# Patient Record
Sex: Female | Born: 1955 | Race: Black or African American | Hispanic: No | Marital: Married | Smoking: Former smoker
Health system: Southern US, Community
[De-identification: ages and names within clinical notes are randomized; demographics above are authoritative.]

## PROBLEM LIST (undated history)

## (undated) DIAGNOSIS — I10 Essential (primary) hypertension: Secondary | ICD-10-CM

## (undated) DIAGNOSIS — F329 Major depressive disorder, single episode, unspecified: Secondary | ICD-10-CM

## (undated) DIAGNOSIS — G4700 Insomnia, unspecified: Secondary | ICD-10-CM

## (undated) DIAGNOSIS — IMO0001 Reserved for inherently not codable concepts without codable children: Secondary | ICD-10-CM

## (undated) DIAGNOSIS — M1990 Unspecified osteoarthritis, unspecified site: Secondary | ICD-10-CM

## (undated) DIAGNOSIS — E139 Other specified diabetes mellitus without complications: Secondary | ICD-10-CM

## (undated) DIAGNOSIS — K219 Gastro-esophageal reflux disease without esophagitis: Secondary | ICD-10-CM

## (undated) DIAGNOSIS — F419 Anxiety disorder, unspecified: Secondary | ICD-10-CM

## (undated) DIAGNOSIS — G43909 Migraine, unspecified, not intractable, without status migrainosus: Secondary | ICD-10-CM

## (undated) DIAGNOSIS — J329 Chronic sinusitis, unspecified: Secondary | ICD-10-CM

## (undated) DIAGNOSIS — F32A Depression, unspecified: Secondary | ICD-10-CM

## (undated) HISTORY — DX: Unspecified osteoarthritis, unspecified site: M19.90

## (undated) HISTORY — DX: Essential (primary) hypertension: I10

## (undated) HISTORY — DX: Gastro-esophageal reflux disease without esophagitis: K21.9

## (undated) HISTORY — DX: Depression, unspecified: F32.A

## (undated) HISTORY — DX: Migraine, unspecified, not intractable, without status migrainosus: G43.909

## (undated) HISTORY — PX: ESOPHAGOGASTRODUODENOSCOPY: SHX1529

## (undated) HISTORY — DX: Insomnia, unspecified: G47.00

## (undated) HISTORY — DX: Reserved for inherently not codable concepts without codable children: IMO0001

## (undated) HISTORY — DX: Major depressive disorder, single episode, unspecified: F32.9

## (undated) HISTORY — DX: Anxiety disorder, unspecified: F41.9

## (undated) HISTORY — DX: Chronic sinusitis, unspecified: J32.9

## (undated) HISTORY — PX: COLONOSCOPY: SHX5424

## (undated) HISTORY — DX: Other specified diabetes mellitus without complications: E13.9

## (undated) HISTORY — PX: ABDOMINAL HYSTERECTOMY: SHX81

## (undated) HISTORY — PX: OOPHORECTOMY: SHX86

---

## 1978-09-29 HISTORY — PX: RECTOVAGINAL FISTULA CLOSURE: SUR265

## 1998-09-29 HISTORY — PX: CYSTECTOMY: SUR359

## 2004-09-29 HISTORY — PX: OVARIAN CYST SURGERY: SHX726

## 2004-10-18 ENCOUNTER — Emergency Department: Payer: Self-pay | Admitting: Unknown Physician Specialty

## 2005-03-03 ENCOUNTER — Inpatient Hospital Stay (HOSPITAL_COMMUNITY): Admission: AD | Admit: 2005-03-03 | Discharge: 2005-03-03 | Payer: Self-pay | Admitting: Obstetrics & Gynecology

## 2005-03-27 ENCOUNTER — Ambulatory Visit: Payer: Self-pay | Admitting: Obstetrics & Gynecology

## 2006-03-17 ENCOUNTER — Ambulatory Visit: Payer: Self-pay | Admitting: Gynecologic Oncology

## 2006-06-18 ENCOUNTER — Ambulatory Visit: Payer: Self-pay

## 2007-06-22 ENCOUNTER — Ambulatory Visit: Payer: Self-pay

## 2007-10-08 ENCOUNTER — Other Ambulatory Visit: Payer: Self-pay

## 2007-10-08 ENCOUNTER — Inpatient Hospital Stay: Payer: Self-pay | Admitting: Internal Medicine

## 2007-11-01 ENCOUNTER — Ambulatory Visit: Payer: Self-pay | Admitting: Internal Medicine

## 2007-11-08 ENCOUNTER — Ambulatory Visit: Payer: Self-pay | Admitting: Internal Medicine

## 2008-06-23 ENCOUNTER — Ambulatory Visit: Payer: Self-pay | Admitting: Internal Medicine

## 2008-12-18 ENCOUNTER — Ambulatory Visit: Payer: Self-pay | Admitting: Orthopedic Surgery

## 2009-07-19 ENCOUNTER — Ambulatory Visit: Payer: Self-pay | Admitting: Nephrology

## 2009-09-05 ENCOUNTER — Ambulatory Visit: Payer: Self-pay | Admitting: Internal Medicine

## 2010-03-14 ENCOUNTER — Ambulatory Visit: Payer: Self-pay | Admitting: Internal Medicine

## 2010-04-07 ENCOUNTER — Ambulatory Visit: Payer: Self-pay | Admitting: Sports Medicine

## 2010-04-26 ENCOUNTER — Ambulatory Visit: Payer: Self-pay | Admitting: Orthopedic Surgery

## 2010-05-02 ENCOUNTER — Ambulatory Visit: Payer: Self-pay | Admitting: Orthopedic Surgery

## 2010-10-02 ENCOUNTER — Ambulatory Visit: Payer: Self-pay

## 2010-11-19 ENCOUNTER — Ambulatory Visit: Payer: Self-pay | Admitting: Internal Medicine

## 2010-12-20 ENCOUNTER — Ambulatory Visit: Payer: Self-pay | Admitting: Emergency Medicine

## 2010-12-24 ENCOUNTER — Ambulatory Visit: Payer: Self-pay | Admitting: Emergency Medicine

## 2011-01-31 ENCOUNTER — Ambulatory Visit: Payer: Self-pay

## 2011-04-26 ENCOUNTER — Emergency Department: Payer: Self-pay | Admitting: *Deleted

## 2011-05-19 ENCOUNTER — Ambulatory Visit: Payer: Self-pay | Admitting: Unknown Physician Specialty

## 2011-05-28 ENCOUNTER — Ambulatory Visit: Payer: Self-pay | Admitting: Internal Medicine

## 2011-09-02 ENCOUNTER — Ambulatory Visit: Payer: Self-pay

## 2011-10-22 ENCOUNTER — Ambulatory Visit: Payer: Self-pay

## 2012-06-04 ENCOUNTER — Ambulatory Visit: Payer: Self-pay

## 2012-06-08 ENCOUNTER — Ambulatory Visit: Payer: Self-pay | Admitting: Internal Medicine

## 2012-11-22 ENCOUNTER — Ambulatory Visit: Payer: Self-pay

## 2013-11-05 IMAGING — CR DG HIP COMPLETE 2+V*L*
1 series · 2 of 2 positions shown · non-contrast
Comparison: none

REASON FOR EXAM: left hip pain
COMMENTS:

PROCEDURE:     DXR - DXR HIP LEFT COMPLETE  - June 04, 2012  [DATE]
RESULT:     AP and frog-leg views the left hip demonstrate no definite
fracture, dislocation or radiopaque foreign body.

[Series 1: ap · 0.17mm/px · 2 of 2 slices shown]
[im 1/2]
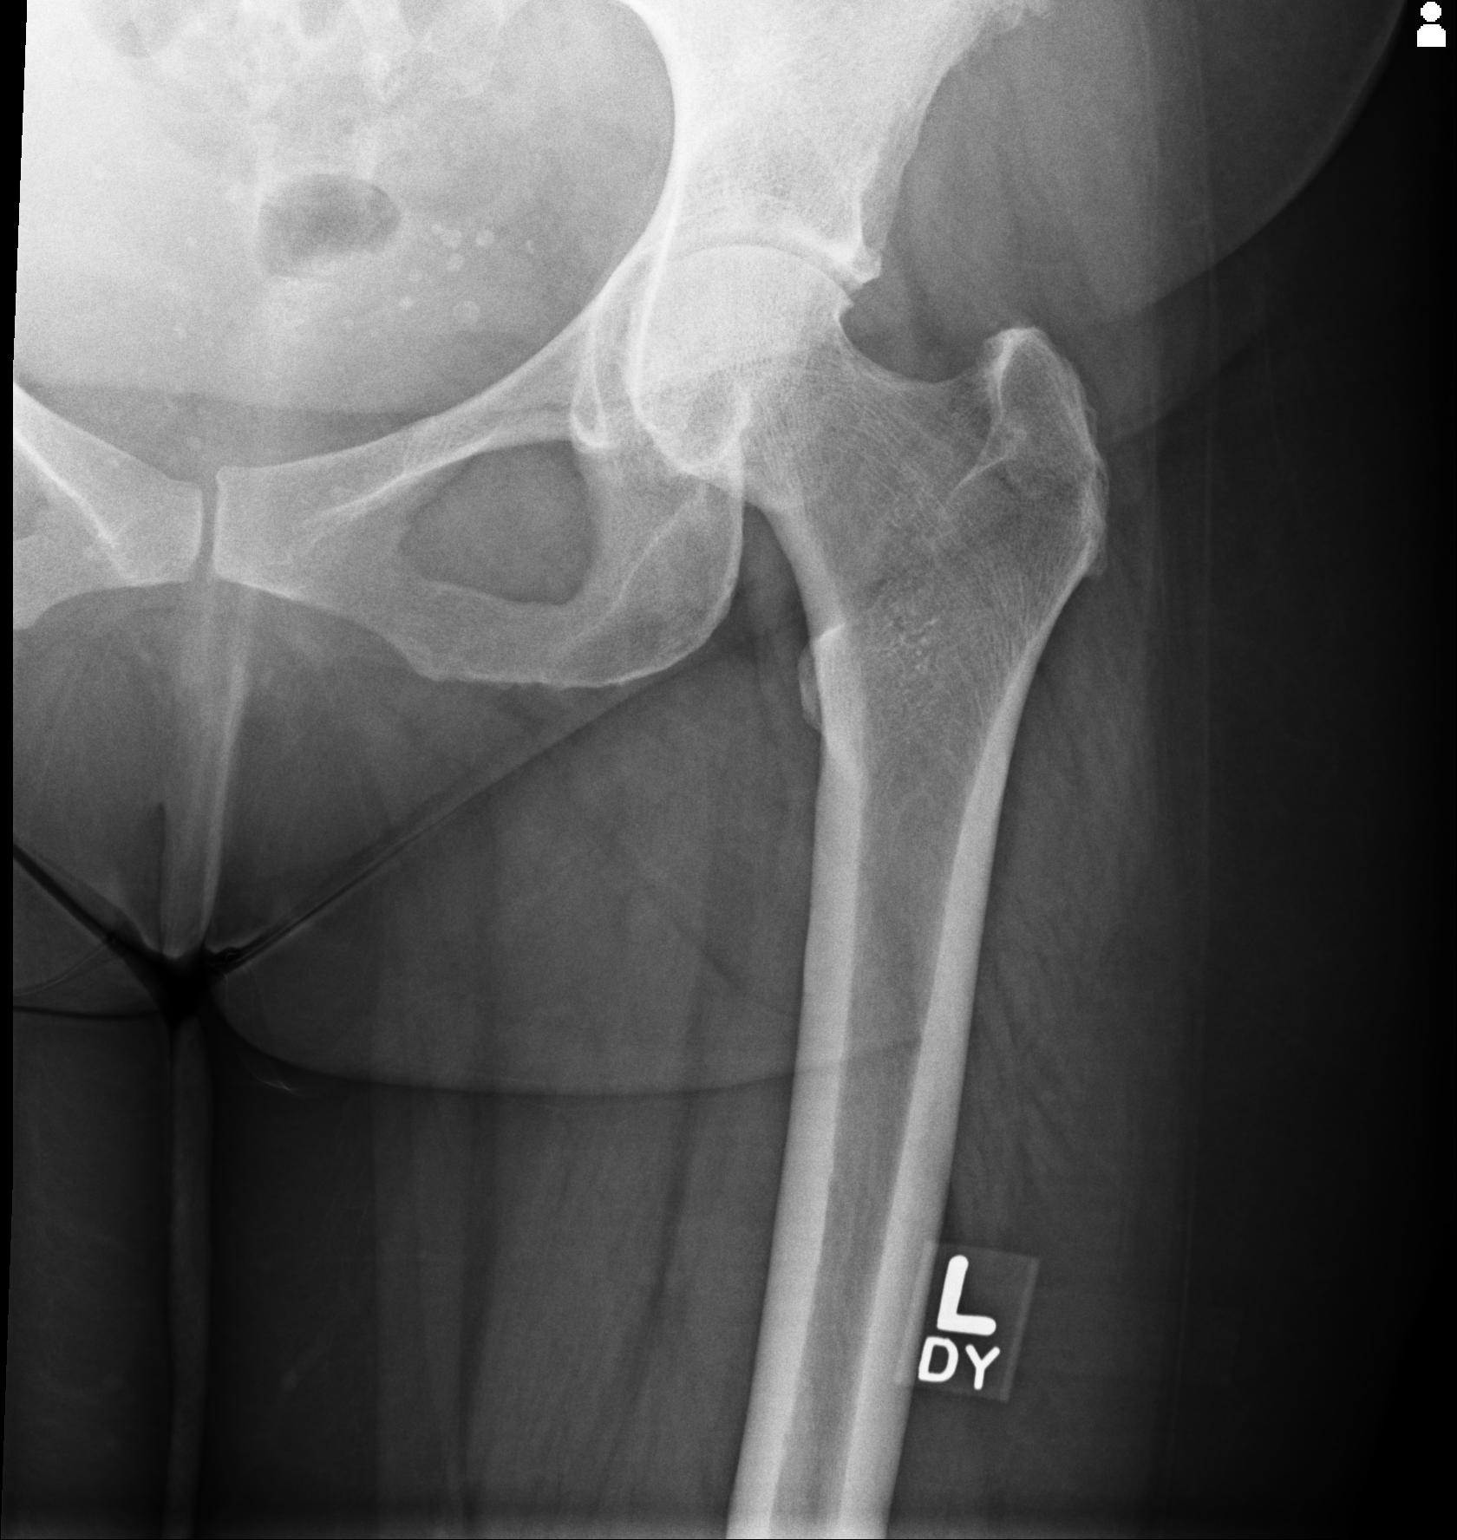
[im 2/2]
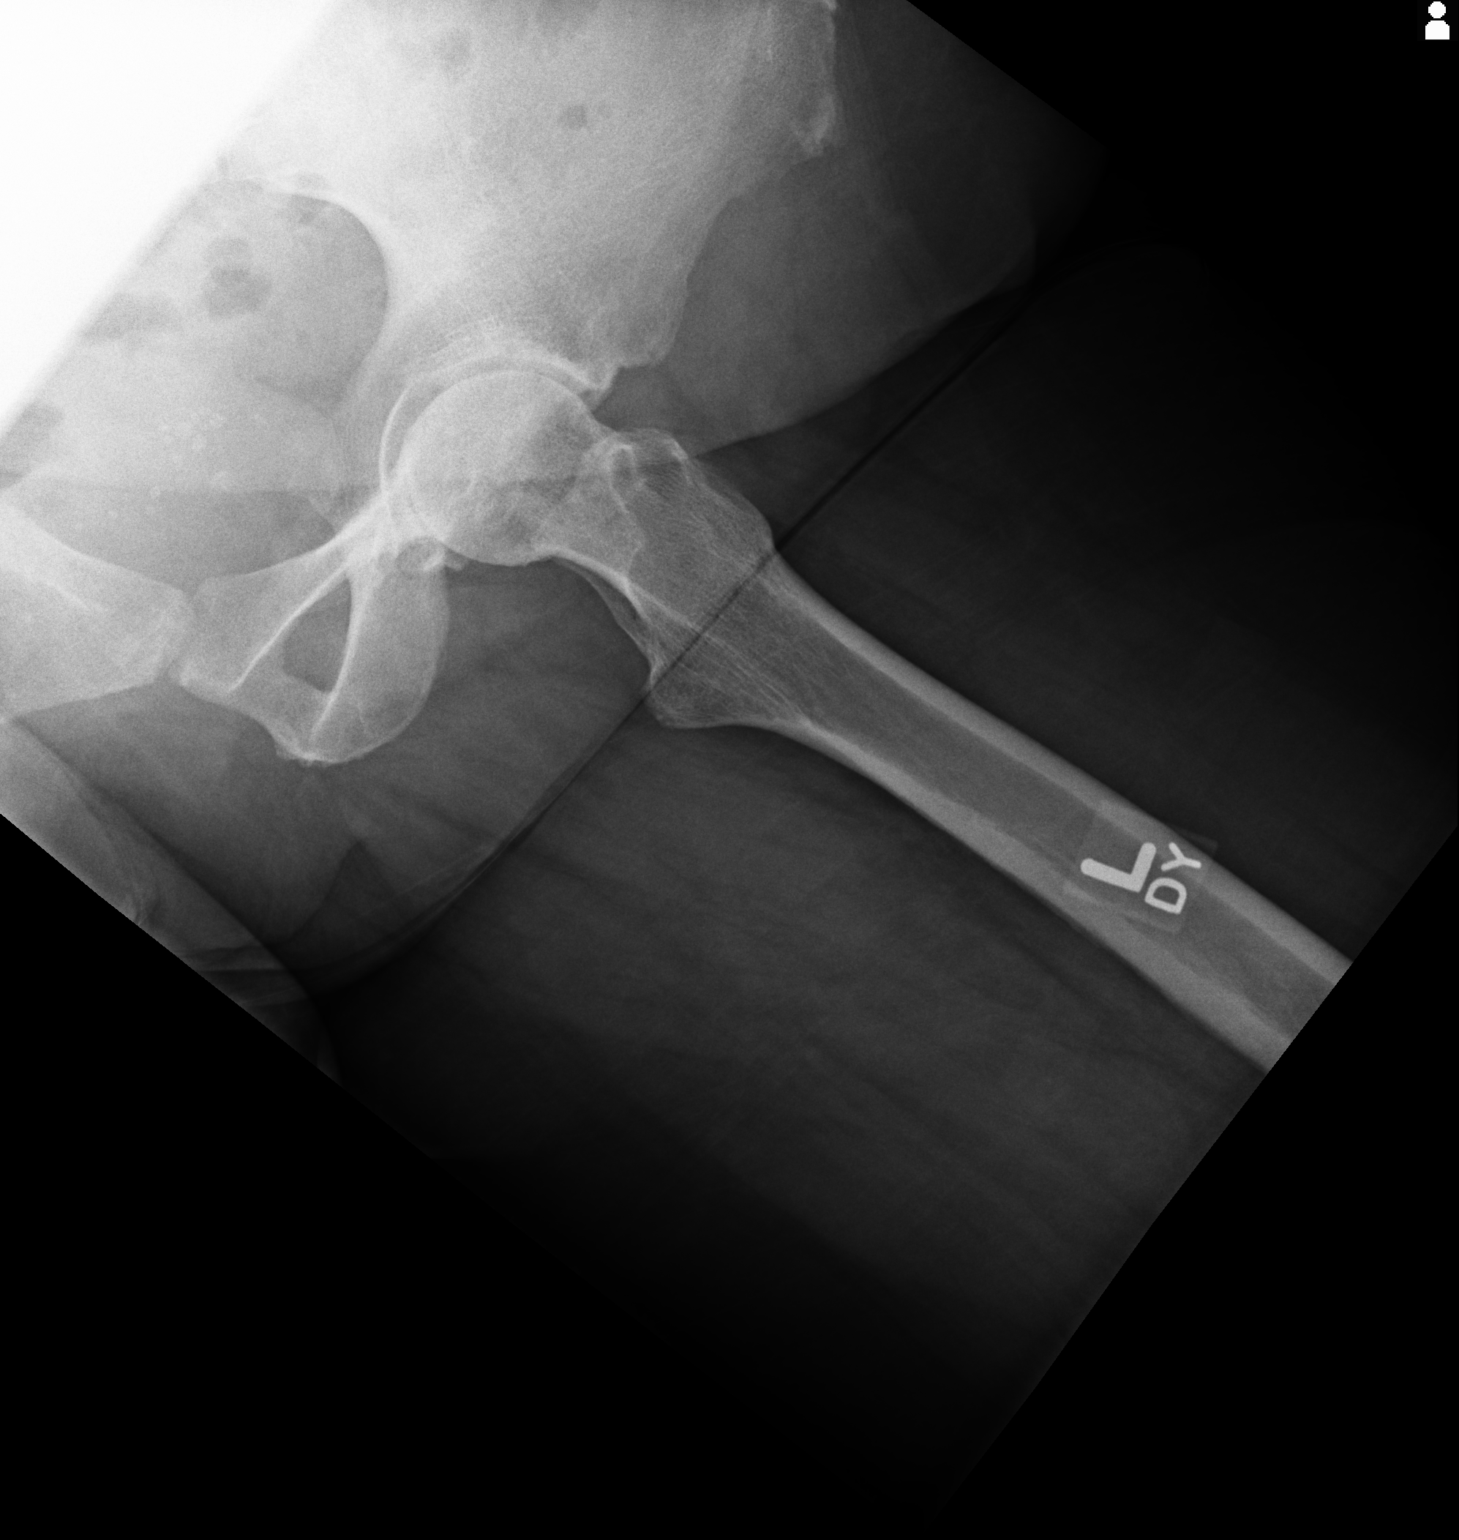

[2 of 2 positions shown; findings below may reference images not displayed]

IMPRESSION: Please see above.

[REDACTED]

## 2013-12-27 ENCOUNTER — Ambulatory Visit: Payer: Self-pay

## 2014-07-31 ENCOUNTER — Emergency Department: Payer: Self-pay | Admitting: Emergency Medicine

## 2014-07-31 LAB — BASIC METABOLIC PANEL
ANION GAP: 10 (ref 7–16)
BUN: 9 mg/dL (ref 7–18)
CO2: 26 mmol/L (ref 21–32)
Calcium, Total: 8.7 mg/dL (ref 8.5–10.1)
Chloride: 105 mmol/L (ref 98–107)
Creatinine: 0.73 mg/dL (ref 0.60–1.30)
EGFR (Non-African Amer.): >60
Glucose: 108 mg/dL — ABNORMAL HIGH (ref 65–99)
Osmolality: 280 (ref 275–301)
POTASSIUM: 3.6 mmol/L (ref 3.5–5.1)
SODIUM: 141 mmol/L (ref 136–145)

## 2014-07-31 LAB — TROPONIN I: Troponin-I: 0.02 ng/mL

## 2014-07-31 LAB — CBC
HCT: 38.8 % (ref 35.0–47.0)
HGB: 13.1 g/dL (ref 12.0–16.0)
MCH: 31.2 pg (ref 26.0–34.0)
MCHC: 33.7 g/dL (ref 32.0–36.0)
MCV: 92 fL (ref 80–100)
Platelet: 308 10*3/uL (ref 150–440)
RBC: 4.21 10*6/uL (ref 3.80–5.20)
RDW: 13.1 % (ref 11.5–14.5)
WBC: 8.7 10*3/uL (ref 3.6–11.0)

## 2015-05-24 ENCOUNTER — Ambulatory Visit (INDEPENDENT_AMBULATORY_CARE_PROVIDER_SITE_OTHER): Payer: Managed Care, Other (non HMO) | Admitting: Obstetrics and Gynecology

## 2015-05-24 ENCOUNTER — Encounter: Payer: Self-pay | Admitting: Obstetrics and Gynecology

## 2015-05-24 VITALS — BP 128/79 | HR 94 | Resp 16 | Ht 69.0 in | Wt 240.0 lb

## 2015-05-24 DIAGNOSIS — K1370 Unspecified lesions of oral mucosa: Secondary | ICD-10-CM | POA: Diagnosis not present

## 2015-05-24 DIAGNOSIS — IMO0002 Reserved for concepts with insufficient information to code with codable children: Secondary | ICD-10-CM

## 2015-05-24 NOTE — Progress Notes (Signed)
GYNECOLOGY PROGRESS NOTE  Subjective:    Patient ID: Valerie Ramos, female    DOB: 04-04-1956, 59 y.o.   MRN: [id number]  HPI  Patient is a 59 y.o. female who presents as a referral from [facility] for "vaginal cysts" and skin tag.  Patient reports that she uses Norforms (vaginal feminine deodorizers) approximately once or twice per month.  States that while inserting one ~ 1-2 months ago, she felt 2 small "bumps" in her vagina.   Went to her PCP who noted that they could be cysts, but referred to GYN to r/o other causes. Denies vaginal pain, discomfort, bleeding.   Past Medical History  Diagnosis Date  . Anxiety   . Hypertension   . Reflux   . Insomnia   . Depression    Past Surgical History  Procedure Laterality Date  . Abdominal hysterectomy    . Cystectomy  2000    Ovarian  . Oophorectomy    . Cesarean section  1989  . Rectovaginal fistula closure  1980   Outpatient Encounter Prescriptions as of 05/24/2015  Medication Sig Note  . ALPRAZolam (XANAX) 0.5 MG tablet TAKE 1 TABLET TWICE A DAY AS NEEDED FOR ANXIETY 05/24/2015: Received from: External Pharmacy  . DULoxetine (CYMBALTA) 30 MG capsule Take 30 mg by mouth daily.   Marland Kitchen esomeprazole (NEXIUM) 40 MG capsule TAKE 1 CAPSULE EVERY DAY FOR REFLUX 05/24/2015: Received from: External Pharmacy  . hydrochlorothiazide (MICROZIDE) 12.5 MG capsule TAKE 1 CAPSULE EVERY DAY FOR BLOOD PRESSURE 05/24/2015: Received from: External Pharmacy  . ibuprofen (ADVIL,MOTRIN) 800 MG tablet Take 800 mg by mouth 3 (three) times daily as needed. 05/24/2015: Received from: External Pharmacy  . metoCLOPramide (REGLAN) 10 MG tablet TAKE 1 TABLET THREE TIMES A DAY AS NEEDED FOR REFLUX 05/24/2015: Received from: External Pharmacy  . PREMARIN 0.9 MG tablet Take 0.9 mg by mouth daily. 05/24/2015: Received from: External Pharmacy  . zolpidem (AMBIEN) 10 MG tablet TAKE 1 TABLET BY MOUTH AT BEDTIME AS NEEDED FOR INSOMNIA 05/24/2015: Received from: External Pharmacy    No facility-administered encounter medications on file as of 05/24/2015.    No Known Allergies   Review of Systems Pertinent items are noted in HPI.   Objective:   Blood pressure 128/79, pulse 94, resp. rate 16, height 5\' 9"  (1.753 m), weight 240 lb (108.863 kg). General appearance: alert and no distress Abdomen: soft, non-tender; bowel sounds normal; no masses,  no organomegaly Pelvic: external genitalia normal, vagina normal without discharge and rectovaginal septum normal, except thickened tissue palpable at base of hymenal ring, likely site of repair of fistula.  Vagina appears normal, scant white thin discharge, no odor.  Two small nodules palpable behind pubic symphysis, on either side, mobile, 2 mm, nontender. Unable to visualize nodules.  Uterus and cervix surgically absent.  Extremities: extremities normal, atraumatic, no cyanosis or edema Neurologic: Grossly normal   Assessment:   Vaginal nodules  Plan:   Discussed differential diagnosis including small vaginal cysts, vs lymph nodes.  Nodules not suspicious for any malignancy, are likely to resolve spontaneously. Advised patient to continue to check for nodules periodically, to ensure that they do not increase in size, cause pain/bleeding, or become fixed (nonmobile).  Patient notes understanding.  Advised on limited use (if not cessation) of vaginal deodorizers.  RTC as needed.    Rubie Maid, MD [facility]

## 2015-05-25 ENCOUNTER — Encounter: Payer: Self-pay | Admitting: Obstetrics and Gynecology

## 2016-01-23 ENCOUNTER — Other Ambulatory Visit: Payer: Self-pay | Admitting: Nurse Practitioner

## 2016-01-23 DIAGNOSIS — R200 Anesthesia of skin: Secondary | ICD-10-CM

## 2016-01-23 DIAGNOSIS — R202 Paresthesia of skin: Secondary | ICD-10-CM

## 2016-01-23 DIAGNOSIS — R079 Chest pain, unspecified: Secondary | ICD-10-CM

## 2016-01-24 ENCOUNTER — Ambulatory Visit
Admission: RE | Admit: 2016-01-24 | Discharge: 2016-01-24 | Disposition: A | Discharge status: Home / Self Care | Payer: Managed Care, Other (non HMO) | Source: Ambulatory Visit | Attending: Nurse Practitioner | Admitting: Nurse Practitioner

## 2016-01-24 DIAGNOSIS — R200 Anesthesia of skin: Secondary | ICD-10-CM | POA: Diagnosis present

## 2016-01-24 DIAGNOSIS — R079 Chest pain, unspecified: Secondary | ICD-10-CM

## 2016-01-24 DIAGNOSIS — R202 Paresthesia of skin: Secondary | ICD-10-CM

## 2016-01-24 DIAGNOSIS — M50322 Other cervical disc degeneration at C5-C6 level: Secondary | ICD-10-CM | POA: Diagnosis not present

## 2016-01-24 DIAGNOSIS — M50323 Other cervical disc degeneration at C6-C7 level: Secondary | ICD-10-CM | POA: Insufficient documentation

## 2016-03-06 ENCOUNTER — Other Ambulatory Visit: Payer: Self-pay | Admitting: Nurse Practitioner

## 2016-03-06 DIAGNOSIS — R079 Chest pain, unspecified: Secondary | ICD-10-CM

## 2016-03-13 ENCOUNTER — Encounter
Admission: RE | Admit: 2016-03-13 | Discharge: 2016-03-13 | Disposition: A | Discharge status: Home / Self Care | Payer: Managed Care, Other (non HMO) | Source: Ambulatory Visit | Attending: Nurse Practitioner | Admitting: Nurse Practitioner

## 2016-03-13 DIAGNOSIS — R079 Chest pain, unspecified: Secondary | ICD-10-CM

## 2016-03-13 MED ORDER — TECHNETIUM TC 99M TETROFOSMIN IV KIT
13.1900 | PACK | Freq: Once | INTRAVENOUS | Status: AC | PRN
  Administered 2016-03-13: 13.19 via INTRAVENOUS

## 2016-03-13 MED ORDER — TECHNETIUM TC 99M TETROFOSMIN IV KIT
32.8100 | PACK | Freq: Once | INTRAVENOUS | Status: AC | PRN
  Administered 2016-03-13: 32.81 via INTRAVENOUS

## 2016-03-14 LAB — NM MYOCAR MULTI W/SPECT W/WALL MOTION / EF
CHL CUP NUCLEAR SDS: 0
CHL CUP NUCLEAR SSS: 2
CHL CUP RESTING HR STRESS: 68 {beats}/min
CSEPEW: 7 METS
Exercise duration (min): 5 min
Exercise duration (sec): 15 s
LV sys vol: 15 mL
LVDIAVOL: 48 mL (ref 46–106)
Peak HR: 150 {beats}/min
SRS: 1
TID: 0.7

## 2017-03-23 DIAGNOSIS — I517 Cardiomegaly: Secondary | ICD-10-CM | POA: Insufficient documentation

## 2017-03-23 DIAGNOSIS — I208 Other forms of angina pectoris: Secondary | ICD-10-CM | POA: Insufficient documentation

## 2017-03-23 DIAGNOSIS — I2089 Other forms of angina pectoris: Secondary | ICD-10-CM | POA: Insufficient documentation

## 2017-05-03 ENCOUNTER — Emergency Department: Payer: 59

## 2017-05-03 ENCOUNTER — Observation Stay
Admission: EM | Admit: 2017-05-03 | Discharge: 2017-05-04 | Disposition: A | Discharge status: Home / Self Care | Payer: 59 | Attending: Internal Medicine | Admitting: Internal Medicine

## 2017-05-03 DIAGNOSIS — F419 Anxiety disorder, unspecified: Secondary | ICD-10-CM | POA: Diagnosis not present

## 2017-05-03 DIAGNOSIS — R0789 Other chest pain: Principal | ICD-10-CM | POA: Insufficient documentation

## 2017-05-03 DIAGNOSIS — Z7984 Long term (current) use of oral hypoglycemic drugs: Secondary | ICD-10-CM | POA: Diagnosis not present

## 2017-05-03 DIAGNOSIS — Z8249 Family history of ischemic heart disease and other diseases of the circulatory system: Secondary | ICD-10-CM | POA: Insufficient documentation

## 2017-05-03 DIAGNOSIS — I10 Essential (primary) hypertension: Secondary | ICD-10-CM | POA: Diagnosis not present

## 2017-05-03 DIAGNOSIS — K219 Gastro-esophageal reflux disease without esophagitis: Secondary | ICD-10-CM | POA: Diagnosis not present

## 2017-05-03 DIAGNOSIS — R079 Chest pain, unspecified: Secondary | ICD-10-CM | POA: Diagnosis present

## 2017-05-03 DIAGNOSIS — G4700 Insomnia, unspecified: Secondary | ICD-10-CM | POA: Insufficient documentation

## 2017-05-03 DIAGNOSIS — F329 Major depressive disorder, single episode, unspecified: Secondary | ICD-10-CM | POA: Diagnosis not present

## 2017-05-03 DIAGNOSIS — E876 Hypokalemia: Secondary | ICD-10-CM | POA: Diagnosis not present

## 2017-05-03 DIAGNOSIS — Z87891 Personal history of nicotine dependence: Secondary | ICD-10-CM | POA: Insufficient documentation

## 2017-05-03 DIAGNOSIS — E119 Type 2 diabetes mellitus without complications: Secondary | ICD-10-CM | POA: Insufficient documentation

## 2017-05-03 DIAGNOSIS — Z79890 Hormone replacement therapy (postmenopausal): Secondary | ICD-10-CM | POA: Diagnosis not present

## 2017-05-03 DIAGNOSIS — Z79899 Other long term (current) drug therapy: Secondary | ICD-10-CM | POA: Diagnosis not present

## 2017-05-03 LAB — BASIC METABOLIC PANEL
ANION GAP: 12 (ref 5–15)
BUN: 12 mg/dL (ref 6–20)
CALCIUM: 9.3 mg/dL (ref 8.9–10.3)
CO2: 25 mmol/L (ref 22–32)
Chloride: 100 mmol/L — ABNORMAL LOW (ref 101–111)
Creatinine, Ser: 0.58 mg/dL (ref 0.44–1.00)
GFR calc Af Amer: >60 mL/min (ref >60)
GFR calc non Af Amer: >60 mL/min (ref >60)
Glucose, Bld: 114 mg/dL — ABNORMAL HIGH (ref 65–99)
POTASSIUM: 3.4 mmol/L — AB (ref 3.5–5.1)
Sodium: 137 mmol/L (ref 135–145)

## 2017-05-03 LAB — CBC
HEMATOCRIT: 37.3 % (ref 35.0–47.0)
HEMOGLOBIN: 12.6 g/dL (ref 12.0–16.0)
MCH: 29.2 pg (ref 26.0–34.0)
MCHC: 33.9 g/dL (ref 32.0–36.0)
MCV: 86.1 fL (ref 80.0–100.0)
Platelets: 333 10*3/uL (ref 150–440)
RBC: 4.33 MIL/uL (ref 3.80–5.20)
RDW: 14 % (ref 11.5–14.5)
WBC: 10.5 10*3/uL (ref 3.6–11.0)

## 2017-05-03 LAB — TROPONIN I: Troponin I: <0.03 ng/mL (ref <0.03)

## 2017-05-03 LAB — GLUCOSE, CAPILLARY: GLUCOSE-CAPILLARY: 153 mg/dL — AB (ref 65–99)

## 2017-05-03 MED ORDER — METFORMIN HCL 500 MG PO TABS
250.0000 mg | ORAL_TABLET | Freq: Two times a day (BID) | ORAL | Status: DC
  Administered 2017-05-04: 250 mg via ORAL
  Filled 2017-05-03: qty 1

## 2017-05-03 MED ORDER — PANTOPRAZOLE SODIUM 40 MG PO TBEC
40.0000 mg | DELAYED_RELEASE_TABLET | Freq: Every day | ORAL | Status: DC
  Administered 2017-05-04: 40 mg via ORAL
  Filled 2017-05-03: qty 1

## 2017-05-03 MED ORDER — DULOXETINE HCL 30 MG PO CPEP: 30.0000 mg | ORAL_CAPSULE | Freq: Every day | ORAL | Status: DC

## 2017-05-03 MED ORDER — ESTROGENS CONJUGATED 0.9 MG PO TABS
0.9000 mg | ORAL_TABLET | Freq: Every day | ORAL | Status: DC
  Administered 2017-05-04: 0.9 mg via ORAL
  Filled 2017-05-03: qty 1

## 2017-05-03 MED ORDER — INSULIN ASPART 100 UNIT/ML ~~LOC~~ SOLN: 0.0000 [IU] | Freq: Three times a day (TID) | SUBCUTANEOUS | Status: DC

## 2017-05-03 MED ORDER — NITROGLYCERIN 0.4 MG SL SUBL
0.4000 mg | SUBLINGUAL_TABLET | SUBLINGUAL | Status: AC
  Administered 2017-05-03: 0.4 mg via SUBLINGUAL

## 2017-05-03 MED ORDER — SODIUM CHLORIDE 0.9 % IV SOLN: 250.0000 mL | INTRAVENOUS | Status: DC | PRN

## 2017-05-03 MED ORDER — HYDROCHLOROTHIAZIDE 12.5 MG PO CAPS
12.5000 mg | ORAL_CAPSULE | Freq: Every day | ORAL | Status: DC
  Administered 2017-05-04: 12.5 mg via ORAL
  Filled 2017-05-03: qty 1

## 2017-05-03 MED ORDER — ACETAMINOPHEN 325 MG PO TABS
650.0000 mg | ORAL_TABLET | Freq: Four times a day (QID) | ORAL | Status: DC | PRN
  Administered 2017-05-04: 650 mg via ORAL
  Filled 2017-05-03: qty 2

## 2017-05-03 MED ORDER — POTASSIUM CHLORIDE CRYS ER 20 MEQ PO TBCR
20.0000 meq | EXTENDED_RELEASE_TABLET | Freq: Once | ORAL | Status: AC
  Administered 2017-05-03: 20 meq via ORAL
  Filled 2017-05-03: qty 1

## 2017-05-03 MED ORDER — ASPIRIN EC 81 MG PO TBEC
81.0000 mg | DELAYED_RELEASE_TABLET | Freq: Every day | ORAL | Status: DC
  Administered 2017-05-04: 81 mg via ORAL
  Filled 2017-05-03: qty 1

## 2017-05-03 MED ORDER — SODIUM CHLORIDE 0.9% FLUSH: 3.0000 mL | INTRAVENOUS | Status: DC | PRN

## 2017-05-03 MED ORDER — NITROGLYCERIN 0.4 MG SL SUBL
0.4000 mg | SUBLINGUAL_TABLET | SUBLINGUAL | Status: AC
  Administered 2017-05-03: 0.4 mg via SUBLINGUAL
  Filled 2017-05-03: qty 1

## 2017-05-03 MED ORDER — ASPIRIN 81 MG PO CHEW
324.0000 mg | CHEWABLE_TABLET | Freq: Once | ORAL | Status: AC
  Administered 2017-05-03: 324 mg via ORAL
  Filled 2017-05-03: qty 4

## 2017-05-03 MED ORDER — ENOXAPARIN SODIUM 40 MG/0.4ML ~~LOC~~ SOLN
40.0000 mg | SUBCUTANEOUS | Status: DC
  Administered 2017-05-03: 40 mg via SUBCUTANEOUS
  Filled 2017-05-03: qty 0.4

## 2017-05-03 MED ORDER — ACETAMINOPHEN 650 MG RE SUPP: 650.0000 mg | Freq: Four times a day (QID) | RECTAL | Status: DC | PRN

## 2017-05-03 MED ORDER — SODIUM CHLORIDE 0.9% FLUSH
3.0000 mL | Freq: Two times a day (BID) | INTRAVENOUS | Status: DC
  Administered 2017-05-03: 3 mL via INTRAVENOUS

## 2017-05-03 MED ORDER — ZOLPIDEM TARTRATE 5 MG PO TABS
5.0000 mg | ORAL_TABLET | Freq: Every evening | ORAL | Status: DC | PRN
  Administered 2017-05-03: 5 mg via ORAL
  Filled 2017-05-03: qty 1

## 2017-05-03 NOTE — H&P (Signed)
Valerie Ramos is an 61 y.o. female.   Chief Complaint: Chest pain HPI: This is a 61 year old female who has a history of hypertension and diabetes. She's been having off and on chest pain for almost a week. She saw Dr. Nehemiah Massed at end of July for same type of symptoms. She was being set up for a stress test but became ill had canceled. Here in the ER she had pain that radiated to her back and she got short of breath. Currently she rates the pain about a 2 out of 10.  Past Medical History:  Diagnosis Date  . Anxiety   . Depression   . Hypertension   . Insomnia   . Reflux     Past Surgical History:  Procedure Laterality Date  . ABDOMINAL HYSTERECTOMY    . CESAREAN SECTION  1989  . CYSTECTOMY  2000   Ovarian  . OOPHORECTOMY    . RECTOVAGINAL FISTULA CLOSURE  1980    Family History  Problem Relation Age of Onset  . Family history unknown: Yes   Social History:  reports that she quit smoking about 5 years ago. She has never used smokeless tobacco. She reports that she does not drink alcohol or use drugs.  Allergies:  Allergies  Allergen Reactions  . Percocet [Oxycodone-Acetaminophen] Itching  . Sulfa Antibiotics Hives     (Not in a hospital admission)  Results for orders placed or performed during the hospital encounter of 05/03/17 (from the past 48 hour(s))  Basic metabolic panel     Status: Abnormal   Collection Time: 05/03/17 12:58 PM  Result Value Ref Range   Sodium 137 135 - 145 mmol/L   Potassium 3.4 (L) 3.5 - 5.1 mmol/L   Chloride 100 (L) 101 - 111 mmol/L   CO2 25 22 - 32 mmol/L   Glucose, Bld 114 (H) 65 - 99 mg/dL   BUN 12 6 - 20 mg/dL   Creatinine, Ser 0.58 0.44 - 1.00 mg/dL   Calcium 9.3 8.9 - 10.3 mg/dL   GFR calc non Af Amer >60 >60 mL/min   GFR calc Af Amer >60 >60 mL/min    Comment: (NOTE) The eGFR has been calculated using the CKD EPI equation. This calculation has not been validated in all clinical situations. eGFR's persistently <60 mL/min signify  possible Chronic Kidney Disease.    Anion gap 12 5 - 15  CBC     Status: None   Collection Time: 05/03/17 12:58 PM  Result Value Ref Range   WBC 10.5 3.6 - 11.0 K/uL   RBC 4.33 3.80 - 5.20 MIL/uL   Hemoglobin 12.6 12.0 - 16.0 g/dL   HCT 37.3 35.0 - 47.0 %   MCV 86.1 80.0 - 100.0 fL   MCH 29.2 26.0 - 34.0 pg   MCHC 33.9 32.0 - 36.0 g/dL   RDW 14.0 11.5 - 14.5 %   Platelets 333 150 - 440 K/uL  Troponin I     Status: None   Collection Time: 05/03/17 12:58 PM  Result Value Ref Range   Troponin I <0.03 <0.03 ng/mL   Dg Chest 2 View  Result Date: 05/03/2017 CLINICAL DATA:  Chest pain radiating to left arm and shortness of breath for several days. EXAM: CHEST  2 VIEW COMPARISON:  01/24/2016 FINDINGS: The heart size and mediastinal contours are within normal limits. Both lungs are clear. The visualized skeletal structures are unremarkable. IMPRESSION: Negative.  No active cardiopulmonary disease. Electronically Signed   By: Jenny Reichmann  Kris Hartmann M.D.   On: 05/03/2017 13:56    Review of Systems  Constitutional: Negative for chills and fever.  HENT: Negative for hearing loss.   Eyes: Negative for blurred vision.  Respiratory: Positive for shortness of breath.   Cardiovascular: Positive for chest pain.  Gastrointestinal: Negative for nausea and vomiting.  Genitourinary: Negative for dysuria.  Musculoskeletal: Negative for myalgias.  Skin: Negative for rash.  Neurological: Negative for dizziness.    Blood pressure 136/74, pulse 66, temperature 98.4 F (36.9 C), temperature source Oral, resp. rate (!) 21, height '5\' 8"'  (1.727 m), weight 97.5 kg (215 lb), SpO2 100 %. Physical Exam  Constitutional: She is oriented to person, place, and time. She appears well-developed and well-nourished. No distress.  HENT:  Head: Normocephalic and atraumatic.  Mouth/Throat: Oropharynx is clear and moist. No oropharyngeal exudate.  Eyes: Pupils are equal, round, and reactive to light. No scleral icterus.  Neck:  Neck supple. No JVD present. No thyromegaly present.  Cardiovascular: Normal rate and regular rhythm.   No murmur heard. Respiratory: Effort normal and breath sounds normal. No respiratory distress.  GI: Soft. Bowel sounds are normal. There is no tenderness.  Musculoskeletal: She exhibits no edema.  Neurological: She is alert and oriented to person, place, and time. No cranial nerve deficit.  Skin: Skin is warm and dry.     Assessment/Plan 1. Chest pain. Patient does have risk factors of both a brother and her mother dying of an MI in her 54s and 56s. She also has hypertension and diabetes. She did have a normal stress test in June 2017. However with these risk factors and new symptoms this does warrant evaluation. We'll go ahead and put her in for observation on telemetry, check further cardiac enzymes. If rules out we will proceed with nuclear stress test in the morning. 2. Hypertension. We'll continue her home medications. 3. Diabetes. We'll continue current medications and add sliding scale. 4. Hypokalemia. Would replete likely secondary to the hydrochlorothiazide.  Total time spent 30 minutes  Baxter Hire, MD 05/03/2017, 6:19 PM

## 2017-05-03 NOTE — ED Triage Notes (Signed)
Pt presents to ED via POV with c/o CP and SHOB. Pt reports CP x1 week with worsening today. (+) nausea, (-) emesis. Pt reports centralized CP with radiation of pain into her back between the shoulder blades and upper left arm. Pt also reports h/x of "enlarged heart". Pt is A&O, in NAD, RR even, regular, and unlabored.

## 2017-05-03 NOTE — ED Provider Notes (Signed)
[facility] Emergency Department Provider Note  ____________________________________________   First MD Initiated Contact with Patient 05/03/17 1646     (approximate)  I have reviewed the triage vital signs and the nursing notes.   HISTORY  Chief Complaint Chest Pain and Shortness of Breath   HPI Valerie Ramos is a 61 y.o. female here for evaluation of chest pain  Painting some pain yesterday, today she reports mid day the pain got worse and came back while at rest.  feeling of pressure and pain in the mid chest that radiates towards the left arm. No nausea or vomiting. Does not feel short of breath. Describes a moderate unremitting pain in the central chest.  No fevers or chills. No recent illness. No history of any blood clots. No trips or travel. No leg swelling. Does not take any estrogens.  She did used to smoke. Has diabetes. Reports a strong family history of coronary disease.  Saw cardiology who set her up for a stress test but missed the appointment  Past Medical History:  Diagnosis Date  . Anxiety   . Depression   . Hypertension   . Insomnia   . Reflux     Patient Active Problem List   Diagnosis Date Noted  . Chest pain 05/03/2017    Past Surgical History:  Procedure Laterality Date  . ABDOMINAL HYSTERECTOMY    . CESAREAN SECTION  1989  . CYSTECTOMY  2000   Ovarian  . OOPHORECTOMY    . [place]    Prior to Admission medications   Medication Sig Start Date End Date Taking? Authorizing Provider  ALPRAZolam (XANAX) 0.5 MG tablet TAKE 1 TABLET TWICE A DAY AS NEEDED FOR ANXIETY 04/30/15  Yes [provider]  esomeprazole (NEXIUM) 40 MG capsule TAKE 1 CAPSULE EVERY DAY FOR REFLUX 04/18/15  Yes [provider]  hydrochlorothiazide (MICROZIDE) 12.5 MG capsule TAKE 1 CAPSULE EVERY DAY FOR BLOOD PRESSURE 05/08/15  Yes [provider]  ibuprofen (ADVIL,MOTRIN) 800 MG tablet Take 800 mg by  mouth 3 (three) times daily as needed. 05/08/15  Yes [provider]  metFORMIN (GLUCOPHAGE) 500 MG tablet Take 250 mg by mouth 2 (two) times daily with a meal.   Yes [provider]  metoCLOPramide (REGLAN) 10 MG tablet TAKE 1 TABLET THREE TIMES A DAY AS NEEDED FOR REFLUX 04/18/15  Yes [provider]  PREMARIN 0.9 MG tablet Take 0.9 mg by mouth daily. 04/19/15  Yes [provider]  zolpidem (AMBIEN) 10 MG tablet TAKE 1 TABLET BY MOUTH AT BEDTIME AS NEEDED FOR INSOMNIA 04/30/15  Yes [provider]  DULoxetine (CYMBALTA) 30 MG capsule Take 30 mg by mouth daily.    [provider]    Allergies Percocet [oxycodone-acetaminophen] and Sulfa antibiotics  Family History  Problem Relation Age of Onset  . Family history unknown: Yes    Social History Social History  Substance Use Topics  . Smoking status: Former Smoker    Quit date: 05/24/2011  . Smokeless tobacco: Never Used  . Alcohol use No    Review of Systems Constitutional: No fever/chills Eyes: No visual changes. ENT: No sore throat. Cardiovascular: See history of present illness Respiratory: Denies shortness of breath. Gastrointestinal: No abdominal pain.  No nausea, no vomiting.  No diarrhea.  No constipation. Genitourinary: Negative for dysuria. Musculoskeletal: Negative for back pain. Skin: Negative for rash. Neurological: Negative for headaches, focal weakness or numbness.    ____________________________________________  PHYSICAL EXAM:  VITAL SIGNS: ED Triage Vitals  Enc Vitals Group     BP 05/03/17 1301 (!) 131/48     Pulse Rate 05/03/17 1301 88     Resp 05/03/17 1301 18     Temp 05/03/17 1301 98.4 F (36.9 C)     Temp Source 05/03/17 1301 Oral     SpO2 05/03/17 1301 99 %     Weight 05/03/17 1258 215 lb (97.5 kg)     Height 05/03/17 1258 5\' 8"  (1.727 m)     Head Circumference --      Peak Flow --      Pain Score 05/03/17 1258 7     Pain Loc --      Pain  Edu? --      Excl. in [place]? --     Constitutional: Alert and oriented. Well appearing and in no acute distress. Eyes: Conjunctivae are normal. Head: Atraumatic. Nose: No congestion/rhinnorhea. Mouth/Throat: Mucous membranes are moist. Neck: No stridor.   Cardiovascular: Normal rate, regular rhythm. Grossly normal heart sounds.  Good peripheral circulation. Respiratory: Normal respiratory effort.  No retractions. Lungs CTAB. Gastrointestinal: Soft and nontender. No distention. Musculoskeletal: No lower extremity tenderness nor edema. Neurologic:  Normal speech and language. No gross focal neurologic deficits are appreciated.  Skin:  Skin is warm, dry and intact. No rash noted. Psychiatric: Mood and affect are normal. Speech and behavior are normal.  ____________________________________________   LABS (all labs ordered are listed, but only abnormal results are displayed)  Labs Reviewed  BASIC METABOLIC PANEL - Abnormal; Notable for the following:       Result Value   Potassium 3.4 (*)    Chloride 100 (*)    Glucose, Bld 114 (*)    All other components within normal limits  GLUCOSE, CAPILLARY - Abnormal; Notable for the following:    Glucose-Capillary 153 (*)    All other components within normal limits  CBC  TROPONIN I  HIV ANTIBODY (ROUTINE TESTING)  TROPONIN I  TROPONIN I  TROPONIN I   ____________________________________________  EKG  ED ECG REPORT I, QUALE, MARK, the attending physician, personally viewed and interpreted this ECG.  Date: 05/03/2017 EKG Time: 1300 Rate: 80 Rhythm: normal sinus rhythm QRS Axis: normal Intervals: normal ST/T Wave abnormalities: normal Narrative Interpretation: unremarkable  ____________________________________________  RADIOLOGY  Dg Chest 2 View  Result Date: 05/03/2017 CLINICAL DATA:  Chest pain radiating to left arm and shortness of breath for several days. EXAM: CHEST  2 VIEW COMPARISON:  01/24/2016 FINDINGS: The heart  size and mediastinal contours are within normal limits. Both lungs are clear. The visualized skeletal structures are unremarkable. IMPRESSION: Negative.  No active cardiopulmonary disease. Electronically Signed   By: Earle Gell M.D.   On: 05/03/2017 13:56    ____________________________________________   PROCEDURES  Procedure(s) performed: None  Procedures  Critical Care performed: No  ____________________________________________   INITIAL IMPRESSION / ASSESSMENT AND PLAN / ED COURSE  Pertinent labs & imaging results that were available during my care of the patient were reviewed by me and considered in my medical decision making (see chart for details).  Patient presents for evaluation of chest pain. Somewhat typical in nature for possible acute coronary syndrome. Reports a heaviness in the chest with radiation to left arm. She does have a questionable history of cardiomegaly and cardiology notes indicate one possible etiology would be ischemic disease.  She was supposed to have a stress test last month but did not have it  completed  Her vital signs are stable. She is in no apparent distress.  Her heart score is moderate risk. First troponin and EKG are normal. However, given the patient's symptomatology, family history, and pretest suspicion for coronary disease as reviewed by cardiology will admit for further workup.        ____________________________________________   FINAL CLINICAL IMPRESSION(S) / ED DIAGNOSES  Final diagnoses:  Moderate risk chest pain      NEW MEDICATIONS STARTED DURING THIS VISIT:  Current Discharge Medication List       Note:  This document was prepared using Dragon voice recognition software and may include unintentional dictation errors.     Delman Kitten, MD 05/03/17 2118

## 2017-05-04 ENCOUNTER — Observation Stay: Payer: 59

## 2017-05-04 LAB — TROPONIN I: Troponin I: <0.03 ng/mL (ref <0.03)

## 2017-05-04 LAB — GLUCOSE, CAPILLARY
Glucose-Capillary: 116 mg/dL — ABNORMAL HIGH (ref 65–99)
Glucose-Capillary: 129 mg/dL — ABNORMAL HIGH (ref 65–99)

## 2017-05-04 MED ORDER — ALPRAZOLAM 0.5 MG PO TABS
0.5000 mg | ORAL_TABLET | Freq: Two times a day (BID) | ORAL | Status: DC | PRN
  Administered 2017-05-04: 0.5 mg via ORAL
  Filled 2017-05-04: qty 1

## 2017-05-04 MED ORDER — TECHNETIUM TC 99M TETROFOSMIN IV KIT
13.0900 | PACK | Freq: Once | INTRAVENOUS | Status: AC | PRN
  Administered 2017-05-04: 13.09 via INTRAVENOUS

## 2017-05-04 MED ORDER — ACETAMINOPHEN 325 MG PO TABS: 650.0000 mg | ORAL_TABLET | Freq: Four times a day (QID) | ORAL | Status: AC | PRN

## 2017-05-04 MED ORDER — POTASSIUM CHLORIDE CRYS ER 20 MEQ PO TBCR: 40.0000 meq | EXTENDED_RELEASE_TABLET | Freq: Once | ORAL | Status: DC

## 2017-05-04 MED ORDER — TECHNETIUM TC 99M TETROFOSMIN IV KIT
32.8470 | PACK | Freq: Once | INTRAVENOUS | Status: AC | PRN
  Administered 2017-05-04: 32.847 via INTRAVENOUS

## 2017-05-04 MED ORDER — REGADENOSON 0.4 MG/5ML IV SOLN
0.4000 mg | Freq: Once | INTRAVENOUS | Status: AC
  Administered 2017-05-04: 0.4 mg via INTRAVENOUS
  Filled 2017-05-04: qty 5

## 2017-05-04 MED ORDER — IBUPROFEN 600 MG PO TABS: 600.0000 mg | ORAL_TABLET | Freq: Three times a day (TID) | ORAL | Status: DC | PRN

## 2017-05-04 MED ORDER — METOCLOPRAMIDE HCL 10 MG PO TABS
10.0000 mg | ORAL_TABLET | Freq: Three times a day (TID) | ORAL | Status: DC
  Administered 2017-05-04: 10 mg via ORAL
  Filled 2017-05-04: qty 1

## 2017-05-04 MED ORDER — FAMOTIDINE 20 MG PO TABS: 20.0000 mg | ORAL_TABLET | Freq: Two times a day (BID) | ORAL | 1 refills | Status: DC

## 2017-05-04 NOTE — Discharge Summary (Signed)
[facility] [place] NAME: Valerie Ramos    MR#:  [id number]  DATE OF BIRTH:  1956-01-13  DATE OF ADMISSION:  05/03/2017 ADMITTING PHYSICIAN: Baxter Hire, MD  DATE OF DISCHARGE:  05/04/17  PRIMARY CARE PHYSICIAN: Ronnell Freshwater, NP    ADMISSION DIAGNOSIS:  Moderate risk chest pain [R07.9]  Discharge diagnosis  Chest pain -musculoskeletal  SECONDARY DIAGNOSIS:   Past Medical History:  Diagnosis Date  . Anxiety   . Depression   . Hypertension   . Insomnia   . Reflux     HOSPITAL COURSE:   HPI: This is a 61 year old female who has a history of hypertension and diabetes. She's been having off and on chest pain for almost a week. She saw Dr. Nehemiah Massed at end of July for same type of symptoms. She was being set up for a stress test but became ill had canceled. Here in the ER she had pain that radiated to her back and she got short of breath. Currently she rates the pain about a 2 out of 10.  1. Chest pain. Patient does have risk factors of both a brother and her mother dying of an MI in her 53s and 24s. She also has hypertension and diabetes. She did have a normal stress test in June 2017.  Chest pain is reproducible, probably musculoskeletal, recommended patient to take Advil with food Cardiac enzymes are normal and acute MI ruled out  Cardiac stress test normal  Okay to discharge patient from cardiology standpoint with the her current home medications  Outpatient follow-up with Dr. Nehemiah Massed in 1 week    2. Hypertension. We'll continue her home medication hydrochloride (titrate as needed 3. Diabetes. We'll continue current medication metformin  4. Hypokalemia. Repleted, if persistently low PCP to consider discontinuing hydrochlorothiazide which could be the etiology 5. GERD-continue PPI, Pepcid twice a day is added  DISCHARGE CONDITIONS:   stable  CONSULTS OBTAINED:     PROCEDURES  Cardiac stress test   DRUG  ALLERGIES:   Allergies  Allergen Reactions  . Percocet [Oxycodone-Acetaminophen] Itching  . Sulfa Antibiotics Hives    DISCHARGE MEDICATIONS:   Current Discharge Medication List    START taking these medications   Details  acetaminophen (TYLENOL) 325 MG tablet Take 2 tablets (650 mg total) by mouth every 6 (six) hours as needed for mild pain (or Fever >/= 101).    famotidine (PEPCID) 20 MG tablet Take 1 tablet (20 mg total) by mouth 2 (two) times daily. Qty: 60 tablet, Refills: 1      CONTINUE these medications which have CHANGED   Details  ibuprofen (ADVIL,MOTRIN) 600 MG tablet Take 1 tablet (600 mg total) by mouth 3 (three) times daily as needed.      CONTINUE these medications which have NOT CHANGED   Details  ALPRAZolam (XANAX) 0.5 MG tablet TAKE 1 TABLET TWICE A DAY AS NEEDED FOR ANXIETY Refills: 2    esomeprazole (NEXIUM) 40 MG capsule TAKE 1 CAPSULE EVERY DAY FOR REFLUX Refills: 2    hydrochlorothiazide (MICROZIDE) 12.5 MG capsule TAKE 1 CAPSULE EVERY DAY FOR BLOOD PRESSURE Refills: 0    metFORMIN (GLUCOPHAGE) 500 MG tablet Take 250 mg by mouth 2 (two) times daily with a meal.    metoCLOPramide (REGLAN) 10 MG tablet TAKE 1 TABLET THREE TIMES A DAY AS NEEDED FOR REFLUX Refills: 0    PREMARIN 0.9 MG tablet Take 0.9 mg by mouth daily. Refills: 0  zolpidem (AMBIEN) 10 MG tablet TAKE 1 TABLET BY MOUTH AT BEDTIME AS NEEDED FOR INSOMNIA Refills: 1    DULoxetine (CYMBALTA) 30 MG capsule Take 30 mg by mouth daily.         DISCHARGE INSTRUCTIONS:  Follow-up with primary care physician in a week Follow-up with Dr. Nehemiah Massed in 2 weeks    DIET:  Cardiac diet and Diabetic diet  DISCHARGE CONDITION:  Stable  ACTIVITY:  Activity as tolerated  OXYGEN:  Home Oxygen: No.   Oxygen Delivery: room air  DISCHARGE LOCATION:  home   If you experience worsening of your admission symptoms, develop shortness of breath, life threatening emergency, suicidal or  homicidal thoughts you must seek medical attention immediately by calling 911 or calling your MD immediately  if symptoms less severe.  You Must read complete instructions/literature along with all the possible adverse reactions/side effects for all the Medicines you take and that have been prescribed to you. Take any new Medicines after you have completely understood and accpet all the possible adverse reactions/side effects.   Please note  You were cared for by a hospitalist during your hospital stay. If you have any questions about your discharge medications or the care you received while you were in the hospital after you are discharged, you can call the unit and asked to speak with the hospitalist on call if the hospitalist that took care of you is not available. Once you are discharged, your primary care physician will handle any further medical issues. Please note that NO REFILLS for any discharge medications will be authorized once you are discharged, as it is imperative that you return to your primary care physician (or establish a relationship with a primary care physician if you do not have one) for your aftercare needs so that they can reassess your need for medications and monitor your lab values.     Today  Chief Complaint  Patient presents with  . Chest Pain  . Shortness of Breath   Patient is reporting left-sided anterior chest wall pain which is reproducible   ROS:  CONSTITUTIONAL: Denies fevers, chills. Denies any fatigue, weakness.  EYES: Denies blurry vision, double vision, eye pain. EARS, NOSE, THROAT: Denies tinnitus, ear pain, hearing loss. RESPIRATORY: Denies cough, wheeze, shortness of breath.  CARDIOVASCULAR: Reporting left-sided anterior chest wall pain denies palpitations, edema.  GASTROINTESTINAL: Denies nausea, vomiting, diarrhea, abdominal pain. Denies bright red blood per rectum. GENITOURINARY: Denies dysuria, hematuria. ENDOCRINE: Denies nocturia or thyroid  problems. HEMATOLOGIC AND LYMPHATIC: Denies easy bruising or bleeding. SKIN: Denies rash or lesion. MUSCULOSKELETAL: Denies pain in neck, back, shoulder, knees, hips or arthritic symptoms.  NEUROLOGIC: Denies paralysis, paresthesias.  PSYCHIATRIC: Denies anxiety or depressive symptoms.   VITAL SIGNS:  Blood pressure (!) 148/78, pulse 78, temperature 98.3 F (36.8 C), temperature source Oral, resp. rate 18, height 5\' 8"  (1.727 m), weight 102.6 kg (226 lb 1.6 oz), SpO2 97 %.  I/O:    Intake/Output Summary (Last 24 hours) at 05/04/17 1410 Last data filed at 05/04/17 0454  Gross per 24 hour  Intake                0 ml  Output              301 ml  Net             -301 ml    PHYSICAL EXAMINATION:  GENERAL:  61 y.o.-year-old patient lying in the bed with no acute distress.  EYES: Pupils  equal, round, reactive to light and accommodation. No scleral icterus. Extraocular muscles intact.  HEENT: Head atraumatic, normocephalic. Oropharynx and nasopharynx clear.  NECK:  Supple, no jugular venous distention. No thyroid enlargement, no tenderness.  LUNGS: Normal breath sounds bilaterally, no wheezing, rales,rhonchi or crepitation. No use of accessory muscles of respiration.  CARDIOVASCULAR: Left anterior chest wall is tender on palpation S1, S2 normal. No murmurs, rubs, or gallops.  ABDOMEN: Soft, non-tender, non-distended. Bowel sounds present. No organomegaly or mass.  EXTREMITIES: No pedal edema, cyanosis, or clubbing.  NEUROLOGIC: Cranial nerves II through XII are intact. Muscle strength 5/5 in all extremities. Sensation intact. Gait not checked.  PSYCHIATRIC: The patient is alert and oriented x 3.  SKIN: No obvious rash, lesion, or ulcer.   DATA REVIEW:   CBC  Recent Labs Lab 05/03/17 1258  WBC 10.5  HGB 12.6  HCT 37.3  PLT 333    Chemistries   Recent Labs Lab 05/03/17 1258  NA 137  K 3.4*  CL 100*  CO2 25  GLUCOSE 114*  BUN 12  CREATININE 0.58  CALCIUM 9.3     Cardiac Enzymes  Recent Labs Lab 05/04/17 1102  [place] <0.03    Microbiology Results  No results found for this or any previous visit.  RADIOLOGY:  Dg Chest 2 View  Result Date: 05/03/2017 CLINICAL DATA:  Chest pain radiating to left arm and shortness of breath for several days. EXAM: CHEST  2 VIEW COMPARISON:  01/24/2016 FINDINGS: The heart size and mediastinal contours are within normal limits. Both lungs are clear. The visualized skeletal structures are unremarkable. IMPRESSION: Negative.  No active cardiopulmonary disease. Electronically Signed   By: Earle Gell M.D.   On: 05/03/2017 13:56    EKG:   Orders placed or performed during the hospital encounter of 05/03/17  . EKG 12-Lead  . EKG 12-Lead  . ED EKG within 10 minutes  . ED EKG within 10 minutes      Management plans discussed with the patient, family and they are in agreement.  CODE STATUS:     Code Status Orders        Start     Ordered   05/03/17 2052  Full code  Continuous     05/03/17 2051    Code Status History    Date Active Date Inactive Code Status Order ID Comments User Context   This patient has a current code status but no historical code status.      TOTAL TIME TAKING CARE OF THIS PATIENT: 45  minutes.   Note: This dictation was prepared with Dragon dictation along with smaller phrase technology. Any transcriptional errors that result from this process are unintentional.   @[facility] @  on 05/04/2017 at 2:10 PM  Between 7am to 6pm - Pager - [phone]  After 6pm go to [email or web address] - password [email or web address] [facility]  Office  [phone]  CC: Primary care physician; Ronnell Freshwater, NP

## 2017-05-04 NOTE — Consult Note (Signed)
[facility] Consultation Note  Patient ID: Valerie Ramos, MRN: [id number], DOB/AGE: 59-28-1957 59 y.o. Admit date: 05/03/2017   Date of Consult: 05/04/2017 Primary Physician: Ronnell Freshwater, NP Primary Cardiologist: Nehemiah Massed  Chief Complaint:  Chief Complaint  Patient presents with  . Chest Pain  . Shortness of Breath   Reason for Consult: chest pain  HPI: 61 y.o. female with known diabetes essential hypertension mixed hyperlipidemia on appropriate medication management for further risk reduction having progressive episodes of left upper chest discomfort pressure in nature increasing in frequency and intensity and duration over the last several weeks to the point where the patient had this at rest and was seen in the emergency room. The patient had an EKG showing normal sinus rhythm otherwise normal EKG the patient also had a normal troponin without evidence of myocardial infarction. The patient has had further telemetry evaluation with no evidence of rhythm disturbances and no further episodes of severe symptoms although still some mild chest pressure. The patient has undergone a Lexiscan infusion EKG with normal myocardial perfusion and no rhythm disturbances. Therefore she was at lowest risk possible at this point from the cardiovascular standpoint and may be discharged to home  Past Medical History:  Diagnosis Date  . Anxiety   . Depression   . Hypertension   . Insomnia   . Reflux       Surgical History:  Past Surgical History:  Procedure Laterality Date  . ABDOMINAL HYSTERECTOMY    . CESAREAN SECTION  1989  . CYSTECTOMY  2000   Ovarian  . OOPHORECTOMY    . RECTOVAGINAL FISTULA CLOSURE  1980     Home Meds: Prior to Admission medications   Medication Sig Start Date End Date Taking? Authorizing Provider  ALPRAZolam (XANAX) 0.5 MG tablet TAKE 1 TABLET TWICE A DAY AS NEEDED FOR ANXIETY 04/30/15  Yes [provider]  esomeprazole (NEXIUM) 40 MG capsule  TAKE 1 CAPSULE EVERY DAY FOR REFLUX 04/18/15  Yes [provider]  hydrochlorothiazide (MICROZIDE) 12.5 MG capsule TAKE 1 CAPSULE EVERY DAY FOR BLOOD PRESSURE 05/08/15  Yes [provider]  ibuprofen (ADVIL,MOTRIN) 800 MG tablet Take 800 mg by mouth 3 (three) times daily as needed. 05/08/15  Yes [provider]  metFORMIN (GLUCOPHAGE) 500 MG tablet Take 250 mg by mouth 2 (two) times daily with a meal.   Yes [provider]  metoCLOPramide (REGLAN) 10 MG tablet TAKE 1 TABLET THREE TIMES A DAY AS NEEDED FOR REFLUX 04/18/15  Yes [provider]  PREMARIN 0.9 MG tablet Take 0.9 mg by mouth daily. 04/19/15  Yes [provider]  zolpidem (AMBIEN) 10 MG tablet TAKE 1 TABLET BY MOUTH AT BEDTIME AS NEEDED FOR INSOMNIA 04/30/15  Yes [provider]  DULoxetine (CYMBALTA) 30 MG capsule Take 30 mg by mouth daily.    [provider]    Inpatient Medications:  . aspirin EC  81 mg Oral Daily  . DULoxetine  30 mg Oral Daily  . enoxaparin (LOVENOX) injection  40 mg Subcutaneous Q24H  . estrogens (conjugated)  0.9 mg Oral Daily  . hydrochlorothiazide  12.5 mg Oral Daily  . insulin aspart  0-9 Units Subcutaneous TID WC  . metFORMIN  250 mg Oral BID WC  . metoCLOPramide  10 mg Oral TID AC  . pantoprazole  40 mg Oral QAC breakfast  . sodium chloride flush  3 mL Intravenous Q12H   . sodium chloride      Allergies:  Allergies  Allergen Reactions  . Percocet [Oxycodone-Acetaminophen] Itching  . Sulfa Antibiotics Hives    Social History   Social History  . Marital status: Married    Spouse name: N/A  . Number of children: N/A  . Years of education: N/A   Occupational History  . Not on file.   Social History Main Topics  . Smoking status: Former Smoker    Quit date: 05/24/2011  . Smokeless tobacco: Never Used  . Alcohol use No  . Drug use: No  . Sexual activity: Not on file   Other Topics Concern  . Not on file   Social History  Narrative  . No narrative on file     Family History  Problem Relation Age of Onset  . Family history unknown: Yes     Review of Systems Positive for Chest pain Negative for: General:  chills, fever, night sweats or weight changes.  Cardiovascular: PND orthopnea syncope dizziness  Dermatological skin lesions rashes Respiratory: Cough congestion Urologic: Frequent urination urination at night and hematuria Abdominal: negative for nausea, vomiting, diarrhea, bright red blood per rectum, melena, or hematemesis Neurologic: negative for visual changes, and/or hearing changes  All other systems reviewed and are otherwise negative except as noted above.  Labs:  Recent Labs  05/03/17 1258 05/03/17 2111 05/04/17 0255 05/04/17 1102  TROPONINI <0.03 <0.03 <0.03 <0.03   Lab Results  Component Value Date   WBC 10.5 05/03/2017   HGB 12.6 05/03/2017   HCT 37.3 05/03/2017   MCV 86.1 05/03/2017   PLT 333 05/03/2017    Recent Labs Lab 05/03/17 1258  NA 137  K 3.4*  CL 100*  CO2 25  BUN 12  CREATININE 0.58  CALCIUM 9.3  GLUCOSE 114*   No results found for: CHOL, HDL, LDLCALC, TRIG No results found for: DDIMER  Radiology/Studies:  Dg Chest 2 View  Result Date: 05/03/2017 CLINICAL DATA:  Chest pain radiating to left arm and shortness of breath for several days. EXAM: CHEST  2 VIEW COMPARISON:  01/24/2016 FINDINGS: The heart size and mediastinal contours are within normal limits. Both lungs are clear. The visualized skeletal structures are unremarkable. IMPRESSION: Negative.  No active cardiopulmonary disease. Electronically Signed   By: Earle Gell M.D.   On: 05/03/2017 13:56    EKG: Normal sinus rhythm  Weights: Filed Weights   05/03/17 1258 05/03/17 2053  Weight: 97.5 kg (215 lb) 102.6 kg (226 lb 1.6 oz)     Physical Exam: Blood pressure (!) 148/78, pulse 78, temperature 98.3 F (36.8 C), temperature source Oral, resp. rate 18, height 5\' 8"  (1.727 m), weight 102.6  kg (226 lb 1.6 oz), SpO2 97 %. Body mass index is 34.38 kg/m. General: Well developed, well nourished, in no acute distress. Head eyes ears nose throat: Normocephalic, atraumatic, sclera non-icteric, no xanthomas, nares are without discharge. No apparent thyromegaly and/or mass  Lungs: Normal respiratory effort.  no wheezes, no rales, no rhonchi.  Heart: RRR with normal S1 S2. no murmur gallop, no rub, PMI is normal size and placement, carotid upstroke normal without bruit, jugular venous pressure is normal Abdomen: Soft, non-tender, non-distended with normoactive bowel sounds. No hepatomegaly. No rebound/guarding. No obvious abdominal masses. Abdominal aorta is normal size without bruit Extremities: No edema. no cyanosis, no clubbing, no ulcers  Peripheral : 2+ bilateral upper extremity pulses, 2+ bilateral femoral pulses, 2+ bilateral dorsal pedal pulse Neuro: Alert and oriented. No facial asymmetry. No focal deficit. Moves all extremities spontaneously. Musculoskeletal: Normal  muscle tone without kyphosis Psych:  Responds to questions appropriately with a normal affect.    Assessment: 61 year old female with essential hypertension makes hyperlipidemia diabetes with chest pain and pressure without evidence of myocardial infarction with a normal stress test  Plan: 1. No further cardiac diagnostics necessary at this time due to normal stress test and no evidence of myocardial infarction 2. Continue diabetes control with appropriate medication management with a goal hemoglobin A1c below 7 3. Continue hydrochlorothiazide for hypertension control X 4. Statin for high intensity cholesterol therapy 5. Okay for ambulation and follow for worsening symptoms and possible discharged home due to normal stress test with follow-up next week  Signed, Corey Skains M.D. [facility] 05/04/2017, 1:17 PM

## 2017-05-04 NOTE — Discharge Instructions (Signed)
Follow-up with primary care physician in a week Follow-up with Dr. Nehemiah Massed in 2 weeks

## 2017-05-04 NOTE — Progress Notes (Signed)
Patient discharged home as ordered,instructions explained and well understood,vital signs within normal limits,prescription given,escorted by staff member and family via wheelchair

## 2017-05-05 LAB — NM MYOCAR MULTI W/SPECT W/WALL MOTION / EF
CHL CUP MPHR: 162 {beats}/min
CHL CUP NUCLEAR SDS: 1
CHL CUP NUCLEAR SSS: 1
CHL CUP RESTING HR STRESS: 80 {beats}/min
CHL CUP STRESS STAGE 1 GRADE: 0 %
CHL CUP STRESS STAGE 1 SPEED: 0 mph
CHL CUP STRESS STAGE 2 SPEED: 0 mph
CHL CUP STRESS STAGE 3 GRADE: 0 %
CHL CUP STRESS STAGE 3 SPEED: 0 mph
CHL CUP STRESS STAGE 4 HR: 96 {beats}/min
CHL CUP STRESS STAGE 5 GRADE: 0 %
CHL CUP STRESS STAGE 5 SPEED: 0 mph
CSEPEW: 1 METS
CSEPPHR: 100 {beats}/min
Exercise duration (min): 1 min
Exercise duration (sec): 0 s
LV dias vol: 78 mL (ref 46–106)
LV sys vol: 30 mL
NUC STRESS TID: 0.84
Percent HR: 62 %
Percent of predicted max HR: 61 %
SRS: 3
Stage 1 HR: 66 {beats}/min
Stage 2 Grade: 0 %
Stage 2 HR: 66 {beats}/min
Stage 3 HR: 100 {beats}/min
Stage 4 Grade: 0 %
Stage 4 Speed: 0 mph
Stage 5 DBP: 68 mmHg
Stage 5 HR: 89 {beats}/min
Stage 5 SBP: 144 mmHg

## 2017-05-05 LAB — HIV ANTIBODY (ROUTINE TESTING W REFLEX): HIV Screen 4th Generation wRfx: NONREACTIVE

## 2017-05-22 ENCOUNTER — Other Ambulatory Visit: Payer: Self-pay | Admitting: Nurse Practitioner

## 2017-05-22 ENCOUNTER — Ambulatory Visit
Admission: RE | Admit: 2017-05-22 | Discharge: 2017-05-22 | Disposition: A | Discharge status: Home / Self Care | Payer: Managed Care, Other (non HMO) | Source: Ambulatory Visit | Attending: Nurse Practitioner | Admitting: Nurse Practitioner

## 2017-05-22 DIAGNOSIS — M545 Low back pain: Secondary | ICD-10-CM

## 2017-09-23 ENCOUNTER — Ambulatory Visit: Payer: 59 | Admitting: Nurse Practitioner

## 2017-09-23 ENCOUNTER — Encounter: Payer: Self-pay | Admitting: Nurse Practitioner

## 2017-09-23 VITALS — BP 166/91 | HR 76 | Temp 98.5°F | Resp 16 | Ht 68.5 in | Wt 235.8 lb

## 2017-09-23 DIAGNOSIS — J209 Acute bronchitis, unspecified: Secondary | ICD-10-CM

## 2017-09-23 DIAGNOSIS — R0602 Shortness of breath: Secondary | ICD-10-CM | POA: Insufficient documentation

## 2017-09-23 DIAGNOSIS — E1159 Type 2 diabetes mellitus with other circulatory complications: Secondary | ICD-10-CM | POA: Insufficient documentation

## 2017-09-23 DIAGNOSIS — F411 Generalized anxiety disorder: Secondary | ICD-10-CM | POA: Insufficient documentation

## 2017-09-23 DIAGNOSIS — F1390 Sedative, hypnotic, or anxiolytic use, unspecified, uncomplicated: Secondary | ICD-10-CM | POA: Insufficient documentation

## 2017-09-23 DIAGNOSIS — R300 Dysuria: Secondary | ICD-10-CM | POA: Insufficient documentation

## 2017-09-23 DIAGNOSIS — F5101 Primary insomnia: Secondary | ICD-10-CM | POA: Insufficient documentation

## 2017-09-23 DIAGNOSIS — N393 Stress incontinence (female) (male): Secondary | ICD-10-CM | POA: Insufficient documentation

## 2017-09-23 DIAGNOSIS — M545 Low back pain, unspecified: Secondary | ICD-10-CM | POA: Insufficient documentation

## 2017-09-23 DIAGNOSIS — I10 Essential (primary) hypertension: Secondary | ICD-10-CM

## 2017-09-23 DIAGNOSIS — D399 Neoplasm of uncertain behavior of female genital organ, unspecified: Secondary | ICD-10-CM | POA: Insufficient documentation

## 2017-09-23 DIAGNOSIS — K219 Gastro-esophageal reflux disease without esophagitis: Secondary | ICD-10-CM | POA: Insufficient documentation

## 2017-09-23 DIAGNOSIS — R05 Cough: Secondary | ICD-10-CM

## 2017-09-23 DIAGNOSIS — J029 Acute pharyngitis, unspecified: Secondary | ICD-10-CM | POA: Insufficient documentation

## 2017-09-23 DIAGNOSIS — E1169 Type 2 diabetes mellitus with other specified complication: Secondary | ICD-10-CM | POA: Insufficient documentation

## 2017-09-23 DIAGNOSIS — E782 Mixed hyperlipidemia: Secondary | ICD-10-CM | POA: Insufficient documentation

## 2017-09-23 DIAGNOSIS — M5010 Cervical disc disorder with radiculopathy, unspecified cervical region: Secondary | ICD-10-CM | POA: Insufficient documentation

## 2017-09-23 DIAGNOSIS — E1165 Type 2 diabetes mellitus with hyperglycemia: Secondary | ICD-10-CM | POA: Insufficient documentation

## 2017-09-23 DIAGNOSIS — H6012 Cellulitis of left external ear: Secondary | ICD-10-CM | POA: Insufficient documentation

## 2017-09-23 DIAGNOSIS — R059 Cough, unspecified: Secondary | ICD-10-CM

## 2017-09-23 DIAGNOSIS — F329 Major depressive disorder, single episode, unspecified: Secondary | ICD-10-CM | POA: Insufficient documentation

## 2017-09-23 DIAGNOSIS — M542 Cervicalgia: Secondary | ICD-10-CM | POA: Insufficient documentation

## 2017-09-23 DIAGNOSIS — M150 Primary generalized (osteo)arthritis: Secondary | ICD-10-CM | POA: Insufficient documentation

## 2017-09-23 MED ORDER — AMOXICILLIN-POT CLAVULANATE 875-125 MG PO TABS: 1.0000 | ORAL_TABLET | Freq: Two times a day (BID) | ORAL | 0 refills | Status: DC

## 2017-09-23 MED ORDER — HYDROCOD POLST-CPM POLST ER 10-8 MG/5ML PO SUER: 5.0000 mL | Freq: Two times a day (BID) | ORAL | 0 refills | Status: DC | PRN

## 2017-09-23 NOTE — Patient Instructions (Addendum)

## 2017-09-23 NOTE — Progress Notes (Signed)
Subjective:     Patient ID: Valerie Ramos, female   DOB: [birth year], 61 y.o.   MRN: [id number]  Patient is here c/o cough, headache, nasal congestion, and post-nasal drop. Has not noted any fever. Has taken OTC robitussin for chest congestion and cough as well as mucinex to help congestion. Nothing has helped. Cough seems to becoming deeper. Did bring up some greenish-yellow sputum earlier today     Current Outpatient Medications:  .  acetaminophen (TYLENOL) 325 MG tablet, Take 2 tablets (650 mg total) by mouth every 6 (six) hours as needed for mild pain (or Fever >/= 101)., Disp: , Rfl:  .  ALPRAZolam (XANAX) 0.5 MG tablet, TAKE 1 TABLET TWICE A DAY AS NEEDED FOR ANXIETY, Disp: , Rfl: 2 .  DULoxetine (CYMBALTA) 30 MG capsule, Take 30 mg by mouth daily., Disp: , Rfl:  .  esomeprazole (NEXIUM) 40 MG capsule, TAKE 1 CAPSULE EVERY DAY FOR REFLUX, Disp: , Rfl: 2 .  glucose blood (ONE TOUCH ULTRA TEST) test strip, FOR ONCE DAILY TESTING DX. E11.65, Disp: , Rfl:  .  hydrochlorothiazide (MICROZIDE) 12.5 MG capsule, TAKE 1 CAPSULE EVERY DAY FOR BLOOD PRESSURE, Disp: , Rfl: 0 .  ibuprofen (ADVIL,MOTRIN) 800 MG tablet, Take 800 mg by mouth every 8 (eight) hours as needed., Disp: , Rfl:  .  metFORMIN (GLUCOPHAGE) 500 MG tablet, Take 250 mg by mouth 2 (two) times daily with a meal., Disp: , Rfl:  .  metoCLOPramide (REGLAN) 10 MG tablet, TAKE 1 TABLET THREE TIMES A DAY AS NEEDED FOR REFLUX, Disp: , Rfl: 0 .  PREMARIN 0.9 MG tablet, Take 0.9 mg by mouth daily., Disp: , Rfl: 0 .  zolpidem (AMBIEN) 10 MG tablet, TAKE 1 TABLET BY MOUTH AT BEDTIME AS NEEDED FOR INSOMNIA, Disp: , Rfl: 1 .  amoxicillin-clavulanate (AUGMENTIN) 875-125 MG tablet, Take 1 tablet by mouth 2 (two) times daily., Disp: 20 tablet, Rfl: 0 .  chlorpheniramine-HYDROcodone (TUSSIONEX PENNKINETIC ER) 10-8 MG/5ML SUER, Take 5 mLs by mouth every 12 (twelve) hours as needed for cough., Disp: 100 mL, Rfl: 0 .  famotidine (PEPCID) 20 MG tablet, Take 1  tablet (20 mg total) by mouth 2 (two) times daily. (Patient not taking: Reported on 09/23/2017), Disp: 60 tablet, Rfl: 1 .  ibuprofen (ADVIL,MOTRIN) 600 MG tablet, Take 1 tablet (600 mg total) by mouth 3 (three) times daily as needed. (Patient not taking: Reported on 09/23/2017), Disp: , Rfl:  .  ONETOUCH DELICA LANCETS FINE MISC, USE AS DIRECTED ONCE ADAY E11.65, Disp: , Rfl: 3 .  traMADol (ULTRAM) 50 MG tablet, TAKE 1 TO 2 TABLETS BY MOUTH 3 TIMES A DAY AS NEEDED FOR PAIN, Disp: , Rfl: 1  Review of Systems  Constitutional: Positive for fatigue. Negative for fever.  HENT: Positive for congestion, postnasal drip, rhinorrhea and sore throat.   Eyes: Negative.   Respiratory: Positive for cough and wheezing.   Cardiovascular:       Elevated bp  Gastrointestinal: Negative.   Endocrine: Negative.   Genitourinary: Negative.   Musculoskeletal: Negative.   Allergic/Immunologic: Positive for environmental allergies.  Neurological: Positive for headaches.  Hematological: Negative.   Psychiatric/Behavioral: Negative.    Today's Vitals   09/23/17 1408  BP: (!) 166/91  Pulse: 76  Resp: 16  Temp: 98.5 F (36.9 C)  SpO2: 98%  Weight: 235 lb 12.8 oz (107 kg)  Height: 5' 8.5" (1.74 m)      Objective:   Physical Exam  Constitutional: She appears well-developed  and well-nourished.  HENT:  Head: Normocephalic and atraumatic.  Right Ear: Tympanic membrane is erythematous.  Left Ear: Tympanic membrane is erythematous.  Nose: Rhinorrhea and sinus tenderness present. Right sinus exhibits maxillary sinus tenderness and frontal sinus tenderness. Left sinus exhibits maxillary sinus tenderness and frontal sinus tenderness.  Mouth/Throat: Oropharyngeal exudate, posterior oropharyngeal erythema and tonsillar abscesses present.  Eyes: Pupils are equal, round, and reactive to light.  Neck: Normal range of motion. Neck supple.  Pulmonary/Chest: Effort normal and breath sounds normal.  Nursing note and  vitals reviewed.      Assessment:     Acute bronchitis, unspecified organism - Plan: amoxicillin-clavulanate (AUGMENTIN) 875-125 MG tablet  Cough - Plan: chlorpheniramine-HYDROcodone (TUSSIONEX PENNKINETIC ER) 10-8 MG/5ML SUER  Essential (primary) hypertension     Plan:     1. Acute bronchitis - augmentin 875mg  twice daily for 10 days. Rest and increase fluids. OTC tylenol/ibuprofen for joint pain and headache 2. tussionex twice daily as needed for cough. Advised patent not to use unless not active due to side effect of sleepiness or dizziness. She voiced understanding and agreement.  3. bp generally well controlled. Continue bp meds as prescribed     Follow up as needed and as scheduled

## 2017-10-26 ENCOUNTER — Other Ambulatory Visit: Payer: Self-pay

## 2017-10-26 MED ORDER — ONETOUCH DELICA LANCETS FINE MISC: 1 refills | Status: DC

## 2017-10-28 ENCOUNTER — Other Ambulatory Visit: Payer: Self-pay

## 2017-10-29 MED ORDER — ONETOUCH DELICA LANCETS FINE MISC: 1 refills | Status: DC

## 2017-11-20 ENCOUNTER — Other Ambulatory Visit: Payer: Self-pay

## 2017-11-20 ENCOUNTER — Other Ambulatory Visit: Payer: Self-pay | Admitting: Internal Medicine

## 2017-11-20 ENCOUNTER — Telehealth: Payer: Self-pay

## 2017-11-20 NOTE — Telephone Encounter (Signed)
Called in [place] 10 mg to pharmacy #[id number] with 2 refills per heather.  dbs

## 2017-11-25 ENCOUNTER — Other Ambulatory Visit: Payer: Self-pay

## 2017-11-25 MED ORDER — IBUPROFEN 800 MG PO TABS: 800.0000 mg | ORAL_TABLET | Freq: Three times a day (TID) | ORAL | 3 refills | Status: DC | PRN

## 2017-12-29 ENCOUNTER — Telehealth: Payer: Self-pay | Admitting: Nurse Practitioner

## 2017-12-29 MED ORDER — ALPRAZOLAM 0.5 MG PO TABS: ORAL_TABLET | ORAL | 0 refills | Status: DC

## 2017-12-29 NOTE — Telephone Encounter (Signed)
Spoke to patient regarding rx refill that we will call in one more refill on xanax , needs appointment for further refills

## 2018-02-08 ENCOUNTER — Ambulatory Visit: Payer: 59 | Admitting: Nurse Practitioner

## 2018-02-08 ENCOUNTER — Encounter: Payer: Self-pay | Admitting: Nurse Practitioner

## 2018-02-08 VITALS — BP 139/78 | HR 83 | Resp 16 | Ht 68.5 in | Wt 227.6 lb

## 2018-02-08 DIAGNOSIS — E119 Type 2 diabetes mellitus without complications: Secondary | ICD-10-CM

## 2018-02-08 DIAGNOSIS — F411 Generalized anxiety disorder: Secondary | ICD-10-CM

## 2018-02-08 DIAGNOSIS — F5101 Primary insomnia: Secondary | ICD-10-CM | POA: Diagnosis not present

## 2018-02-08 DIAGNOSIS — K219 Gastro-esophageal reflux disease without esophagitis: Secondary | ICD-10-CM

## 2018-02-08 DIAGNOSIS — N959 Unspecified menopausal and perimenopausal disorder: Secondary | ICD-10-CM

## 2018-02-08 DIAGNOSIS — L659 Nonscarring hair loss, unspecified: Secondary | ICD-10-CM

## 2018-02-08 LAB — POCT GLYCOSYLATED HEMOGLOBIN (HGB A1C): HEMOGLOBIN A1C: 6.8

## 2018-02-08 MED ORDER — ZOLPIDEM TARTRATE 10 MG PO TABS: 10.0000 mg | ORAL_TABLET | Freq: Every evening | ORAL | 1 refills | Status: DC | PRN

## 2018-02-08 MED ORDER — ESOMEPRAZOLE MAGNESIUM 40 MG PO CPDR: 40.0000 mg | DELAYED_RELEASE_CAPSULE | Freq: Every day | ORAL | 4 refills | Status: DC

## 2018-02-08 MED ORDER — ALPRAZOLAM 0.5 MG PO TABS: ORAL_TABLET | ORAL | 1 refills | Status: DC

## 2018-02-08 MED ORDER — PREMARIN 0.9 MG PO TABS: 0.9000 mg | ORAL_TABLET | Freq: Every day | ORAL | 4 refills | Status: DC

## 2018-02-08 NOTE — Progress Notes (Signed)
[facility] [place], [place] [address]  Internal MEDICINE  Office Visit Note  Patient Name: Valerie Ramos  [id number]  [id number]  Date of Service: 02/08/2018  Chief Complaint  Patient presents with  . medication refills  . Alopecia    Has noted hair coming out in clumps for past several months. Has started to get much worse over the past 2 months. She did talk to hair dresser who asked about any new medication. Newest medication is the metformin and she has been on that for past year or more. She does admit to being stressed. Has a son who was recenlty diagnosed with MS and she worries about him all the time.    Pt is here for routine follow up.    Current Medication: Outpatient Encounter Medications as of 02/08/2018  Medication Sig Note  . acetaminophen (TYLENOL) 325 MG tablet Take 2 tablets (650 mg total) by mouth every 6 (six) hours as needed for mild pain (or Fever >/= 101).   Marland Kitchen ALPRAZolam (XANAX) 0.5 MG tablet TAKE 1 TABLET TWICE A DAY AS NEEDED FOR ANXIETY   . amoxicillin-clavulanate (AUGMENTIN) 875-125 MG tablet Take 1 tablet by mouth 2 (two) times daily.   . chlorpheniramine-HYDROcodone (TUSSIONEX PENNKINETIC ER) 10-8 MG/5ML SUER Take 5 mLs by mouth every 12 (twelve) hours as needed for cough.   . DULoxetine (CYMBALTA) 30 MG capsule Take 30 mg by mouth daily.   Marland Kitchen esomeprazole (NEXIUM) 40 MG capsule Take 1 capsule (40 mg total) by mouth daily at 12 noon.   . famotidine (PEPCID) 20 MG tablet Take 1 tablet (20 mg total) by mouth 2 (two) times daily. (Patient not taking: Reported on 09/23/2017)   . glucose blood (ONE TOUCH ULTRA TEST) test strip FOR ONCE DAILY TESTING DX. E11.65   . hydrochlorothiazide (MICROZIDE) 12.5 MG capsule TAKE 1 CAPSULE EVERY DAY FOR BLOOD PRESSURE 05/24/2015: Received from: External Pharmacy  . ibuprofen (ADVIL,MOTRIN) 600 MG tablet Take 1 tablet (600 mg total) by mouth 3 (three) times daily as needed. (Patient not taking: Reported  on 09/23/2017)   . ibuprofen (ADVIL,MOTRIN) 800 MG tablet Take 1 tablet (800 mg total) by mouth 3 (three) times daily as needed.   . metFORMIN (GLUCOPHAGE) 500 MG tablet Take 250 mg by mouth 2 (two) times daily with a meal. 05/03/2017: Pt takes 1/2 tablet of 500mg  for a 250mg  dose BID.  Marland Kitchen metoCLOPramide (REGLAN) 10 MG tablet TAKE 1 TABLET THREE TIMES A DAY AS NEEDED FOR REFLUX 05/24/2015: Received from: External Pharmacy  . ONETOUCH DELICA LANCETS FINE MISC Use as directed to check blood sugars once daily   . PREMARIN 0.9 MG tablet Take 1 tablet (0.9 mg total) by mouth daily.   . traMADol (ULTRAM) 50 MG tablet TAKE 1 TO 2 TABLETS BY MOUTH 3 TIMES A DAY AS NEEDED FOR PAIN   . zolpidem (AMBIEN) 10 MG tablet Take 1 tablet (10 mg total) by mouth at bedtime as needed for sleep.   . [DISCONTINUED] ALPRAZolam (XANAX) 0.5 MG tablet TAKE 1 TABLET TWICE A DAY AS NEEDED FOR ANXIETY   . [DISCONTINUED] esomeprazole (NEXIUM) 40 MG capsule TAKE 1 CAPSULE EVERY DAY FOR REFLUX 05/24/2015: Received from: External Pharmacy  . [DISCONTINUED] PREMARIN 0.9 MG tablet Take 0.9 mg by mouth daily. 05/24/2015: Received from: External Pharmacy  . [DISCONTINUED] zolpidem (AMBIEN) 10 MG tablet TAKE 1 TABLET BY MOUTH AT BEDTIME AS NEEDED FOR INSOMNIA 05/24/2015: Received from: External Pharmacy   No facility-administered encounter  medications on file as of 02/08/2018.     Surgical History: Past Surgical History:  Procedure Laterality Date  . ABDOMINAL HYSTERECTOMY    . CESAREAN SECTION  1989  . CYSTECTOMY  2000   Ovarian  . OOPHORECTOMY    . OVARIAN CYST SURGERY  2006  . RECTOVAGINAL FISTULA CLOSURE  1980    Medical History: Past Medical History:  Diagnosis Date  . Anxiety   . Arthritis   . Depression   . Diabetes 1.5, managed as type 2 ([place])   . Hypertension   . Insomnia   . Migraines   . Reflux   . Sinusitis     Family History: Family History  Problem Relation Age of Onset  . Hodgkin's lymphoma Mother   .  Diabetes Mother   . Thyroid disease Mother   . Glaucoma Mother   . Stroke Brother   . Heart disease Brother     Social History   Socioeconomic History  . Marital status: Married    Spouse name: Not on file  . Number of children: Not on file  . Years of education: Not on file  . Highest education level: Not on file  Occupational History  . Not on file  Social Needs  . Financial resource strain: Not on file  . Food insecurity:    Worry: Not on file    Inability: Not on file  . Transportation needs:    Medical: Not on file    Non-medical: Not on file  Tobacco Use  . Smoking status: Former Smoker    Last attempt to quit: 05/24/2011    Years since quitting: 6.7  . Smokeless tobacco: Never Used  Substance and Sexual Activity  . Alcohol use: No  . Drug use: No  . Sexual activity: Not on file  Lifestyle  . Physical activity:    Days per week: Not on file    Minutes per session: Not on file  . Stress: Not on file  Relationships  . Social connections:    Talks on phone: Not on file    Gets together: Not on file    Attends religious service: Not on file    Active member of club or organization: Not on file    Attends meetings of clubs or organizations: Not on file    Relationship status: Not on file  . Intimate partner violence:    Fear of current or ex partner: Not on file    Emotionally abused: Not on file    Physically abused: Not on file    Forced sexual activity: Not on file  Other Topics Concern  . Not on file  Social History Narrative  . Not on file      Review of Systems  Constitutional: Negative for activity change, chills, fatigue and unexpected weight change.  HENT: Negative for congestion, postnasal drip, rhinorrhea, sneezing and sore throat.   Eyes: Negative.  Negative for redness.  Respiratory: Negative for cough, chest tightness, shortness of breath and wheezing.   Cardiovascular: Negative for chest pain and palpitations.  Gastrointestinal:  Negative for abdominal pain, constipation, diarrhea, nausea and vomiting.  Endocrine:       Blood sugars doing well    Genitourinary: Negative for dysuria and frequency.  Musculoskeletal: Negative for arthralgias, back pain, joint swelling and neck pain.  Skin: Negative for rash.       Hair falling out in clumps over past few months  Allergic/Immunologic: Negative for environmental allergies.  Neurological: Positive  for headaches. Negative for tremors and numbness.  Hematological: Negative for adenopathy. Does not bruise/bleed easily.  Psychiatric/Behavioral: Negative for behavioral problems (Depression), sleep disturbance and suicidal ideas. The patient is nervous/anxious.     Vital Signs: BP 139/78 (BP Location: Left Arm, Patient Position: Sitting)   Pulse 83   Resp 16   Ht 5' 8.5" (1.74 m)   Wt 227 lb 9.6 oz (103.2 kg)   SpO2 97%   BMI 34.10 kg/m    Physical Exam  Constitutional: She is oriented to person, place, and time. She appears well-developed and well-nourished. No distress.  HENT:  Head: Normocephalic and atraumatic.  Mouth/Throat: Oropharynx is clear and moist. No oropharyngeal exudate.  Eyes: Pupils are equal, round, and reactive to light. EOM are normal.  Neck: Normal range of motion. Neck supple. No JVD present. No tracheal deviation present. No thyromegaly present.  Cardiovascular: Normal rate, regular rhythm and normal heart sounds. Exam reveals no gallop and no friction rub.  No murmur heard. Pulmonary/Chest: Effort normal. No respiratory distress. She has no wheezes. She has no rales. She exhibits no tenderness.  Abdominal: Soft. Bowel sounds are normal.  Musculoskeletal: Normal range of motion.  Lymphadenopathy:    She has no cervical adenopathy.  Neurological: She is alert and oriented to person, place, and time. No cranial nerve deficit.  Skin: Skin is warm and dry. She is not diaphoretic.  Psychiatric: She has a normal mood and affect. Her behavior is  normal. Judgment and thought content normal.  Nursing note and vitals reviewed.   Assessment/Plan:  1. Diabetes mellitus without complication (HCC) - POCT HgB A1C 6.8 today. Continue metformin as prescribed.  - Urine Microalbumin w/creat. ratio  2. Alopecia Check thyroid panel and treat as indicated  - TSH + free T4  3. Primary insomnia Refill ambien to take at bedtime as needed.  - zolpidem (AMBIEN) 10 MG tablet; Take 1 tablet (10 mg total) by mouth at bedtime as needed for sleep.  Dispense: 90 tablet; Refill: 1  4. Unspecified menopausal and perimenopausal disorder Refill premarin tablets - PREMARIN 0.9 MG tablet; Take 1 tablet (0.9 mg total) by mouth daily.  Dispense: 90 tablet; Refill: 4  5. Gastro-esophageal reflux disease without esophagitis Continue nexium as prescribe.d  - esomeprazole (NEXIUM) 40 MG capsule; Take 1 capsule (40 mg total) by mouth daily at 12 noon.  Dispense: 90 capsule; Refill: 4  6. Generalized anxiety disorder Alprazolam 0.5mg  twice daily as needed. New prescription sent to her pharmacy. - ALPRAZolam (XANAX) 0.5 MG tablet; TAKE 1 TABLET TWICE A DAY AS NEEDED FOR ANXIETY  Dispense: 90 tablet; Refill: 1   General Counseling: Leeya verbalizes understanding of the findings of todays visit and agrees with plan of treatment. I have discussed any further diagnostic evaluation that may be needed or ordered today. We also reviewed her medications today. she has been encouraged to call the office with any questions or concerns that should arise related to todays visit.   This patient was seen by Leretha Pol, FNP- C in Collaboration with Dr Lavera Guise as a part of collaborative care agreement    Orders Placed This Encounter  Procedures  . TSH + free T4  . Urine Microalbumin w/creat. ratio  . POCT HgB A1C    Meds ordered this encounter  Medications  . ALPRAZolam (XANAX) 0.5 MG tablet    Sig: TAKE 1 TABLET TWICE A DAY AS NEEDED FOR ANXIETY     Dispense:  90 tablet  Refill:  1    This is 90 day prscription    Order Specific Question:   Supervising Provider    Answer:   Lavera Guise [[id number]]  . esomeprazole (NEXIUM) 40 MG capsule    Sig: Take 1 capsule (40 mg total) by mouth daily at 12 noon.    Dispense:  90 capsule    Refill:  4    Order Specific Question:   Supervising Provider    Answer:   Lavera Guise [[id number]]  . PREMARIN 0.9 MG tablet    Sig: Take 1 tablet (0.9 mg total) by mouth daily.    Dispense:  90 tablet    Refill:  4    Order Specific Question:   Supervising Provider    Answer:   Lavera Guise [[id number]]  . zolpidem (AMBIEN) 10 MG tablet    Sig: Take 1 tablet (10 mg total) by mouth at bedtime as needed for sleep.    Dispense:  90 tablet    Refill:  1    This is 0 day prescription    Order Specific Question:   Supervising Provider    Answer:   Lavera Guise [[id number]]    Time spent: 49 Minutes      Dr Lavera Guise Internal medicine

## 2018-03-05 ENCOUNTER — Encounter: Payer: Self-pay | Admitting: Nurse Practitioner

## 2018-03-05 ENCOUNTER — Ambulatory Visit (INDEPENDENT_AMBULATORY_CARE_PROVIDER_SITE_OTHER): Payer: 59 | Admitting: Nurse Practitioner

## 2018-03-05 VITALS — BP 126/77 | HR 83 | Resp 16 | Ht 68.5 in | Wt 225.0 lb

## 2018-03-05 DIAGNOSIS — R10814 Left lower quadrant abdominal tenderness: Secondary | ICD-10-CM

## 2018-03-05 DIAGNOSIS — R079 Chest pain, unspecified: Secondary | ICD-10-CM

## 2018-03-05 DIAGNOSIS — E119 Type 2 diabetes mellitus without complications: Secondary | ICD-10-CM | POA: Diagnosis not present

## 2018-03-05 DIAGNOSIS — Z0001 Encounter for general adult medical examination with abnormal findings: Secondary | ICD-10-CM

## 2018-03-05 DIAGNOSIS — R0602 Shortness of breath: Secondary | ICD-10-CM

## 2018-03-05 DIAGNOSIS — I10 Essential (primary) hypertension: Secondary | ICD-10-CM

## 2018-03-05 DIAGNOSIS — R300 Dysuria: Secondary | ICD-10-CM | POA: Diagnosis not present

## 2018-03-05 NOTE — Progress Notes (Signed)
[facility] [place], [place] [address]  Internal MEDICINE  Office Visit Note  Patient Name: Valerie Ramos  [id number]  [id number]  Date of Service: 03/24/2018   Pt is here for routine health maintenance examination   Chief Complaint  Patient presents with  . Annual Exam  . Chest Pain    The patient states that she has had left sided chest pain, off and on, since Sunday. Currently. Not having pain. pai is sharp, radiates down left arm. Will take her breath away for a moment or two. She denies nausea or vomiting. She denies headache.    Current Medication: Outpatient Encounter Medications as of 03/05/2018  Medication Sig Note  . acetaminophen (TYLENOL) 325 MG tablet Take 2 tablets (650 mg total) by mouth every 6 (six) hours as needed for mild pain (or Fever >/= 101).   Marland Kitchen ALPRAZolam (XANAX) 0.5 MG tablet TAKE 1 TABLET TWICE A DAY AS NEEDED FOR ANXIETY   . amoxicillin-clavulanate (AUGMENTIN) 875-125 MG tablet Take 1 tablet by mouth 2 (two) times daily.   . chlorpheniramine-HYDROcodone (TUSSIONEX PENNKINETIC ER) 10-8 MG/5ML SUER Take 5 mLs by mouth every 12 (twelve) hours as needed for cough.   . DULoxetine (CYMBALTA) 30 MG capsule Take 30 mg by mouth daily.   . famotidine (PEPCID) 20 MG tablet Take 1 tablet (20 mg total) by mouth 2 (two) times daily. (Patient not taking: Reported on 09/23/2017)   . glucose blood (ONE TOUCH ULTRA TEST) test strip FOR ONCE DAILY TESTING DX. E11.65   . hydrochlorothiazide (MICROZIDE) 12.5 MG capsule TAKE 1 CAPSULE EVERY DAY FOR BLOOD PRESSURE 05/24/2015: Received from: External Pharmacy  . ibuprofen (ADVIL,MOTRIN) 600 MG tablet Take 1 tablet (600 mg total) by mouth 3 (three) times daily as needed. (Patient not taking: Reported on 09/23/2017)   . ibuprofen (ADVIL,MOTRIN) 800 MG tablet Take 1 tablet (800 mg total) by mouth 3 (three) times daily as needed.   . metFORMIN (GLUCOPHAGE) 500 MG tablet Take 250 mg by mouth 2 (two) times daily  with a meal. 05/03/2017: Pt takes 1/2 tablet of 500mg  for a 250mg  dose BID.  Marland Kitchen metoCLOPramide (REGLAN) 10 MG tablet TAKE 1 TABLET THREE TIMES A DAY AS NEEDED FOR REFLUX 05/24/2015: Received from: External Pharmacy  . ONETOUCH DELICA LANCETS FINE MISC Use as directed to check blood sugars once daily   . PREMARIN 0.9 MG tablet Take 1 tablet (0.9 mg total) by mouth daily.   . traMADol (ULTRAM) 50 MG tablet TAKE 1 TO 2 TABLETS BY MOUTH 3 TIMES A DAY AS NEEDED FOR PAIN   . zolpidem (AMBIEN) 10 MG tablet Take 1 tablet (10 mg total) by mouth at bedtime as needed for sleep.   . [DISCONTINUED] esomeprazole (NEXIUM) 40 MG capsule Take 1 capsule (40 mg total) by mouth daily at 12 noon.    No facility-administered encounter medications on file as of 03/05/2018.     Surgical History: Past Surgical History:  Procedure Laterality Date  . ABDOMINAL HYSTERECTOMY    . CESAREAN SECTION  1989  . CYSTECTOMY  2000   Ovarian  . OOPHORECTOMY    . OVARIAN CYST SURGERY  2006  . RECTOVAGINAL FISTULA CLOSURE  1980    Medical History: Past Medical History:  Diagnosis Date  . Anxiety   . Arthritis   . Depression   . Diabetes 1.5, managed as type 2 ([place])   . Hypertension   . Insomnia   . Migraines   . Reflux   .  Sinusitis     Family History: Family History  Problem Relation Age of Onset  . Hodgkin's lymphoma Mother   . Diabetes Mother   . Thyroid disease Mother   . Glaucoma Mother   . Stroke Brother   . Heart disease Brother       Review of Systems  Constitutional: Positive for fatigue. Negative for activity change, chills and unexpected weight change.  HENT: Negative for congestion, postnasal drip, rhinorrhea, sneezing and sore throat.   Eyes: Negative.  Negative for redness.  Respiratory: Negative for cough, chest tightness, shortness of breath and wheezing.   Cardiovascular: Positive for chest pain and palpitations.  Gastrointestinal: Negative for abdominal pain, constipation, diarrhea, nausea  and vomiting.  Endocrine: Negative for cold intolerance, heat intolerance, polydipsia, polyphagia and polyuria.       Blood sugars doing well    Genitourinary: Positive for pelvic pain. Negative for dysuria, frequency and hematuria.  Musculoskeletal: Negative for arthralgias, back pain, joint swelling and neck pain.  Skin: Negative for rash.       Hair falling out in clumps over past few months  Allergic/Immunologic: Negative for environmental allergies.  Neurological: Positive for headaches. Negative for tremors and numbness.  Hematological: Negative for adenopathy. Does not bruise/bleed easily.  Psychiatric/Behavioral: Positive for dysphoric mood. Negative for behavioral problems (Depression), sleep disturbance and suicidal ideas. The patient is nervous/anxious.      Vital Signs: BP 126/77   Pulse 83   Resp 16   Ht 5' 8.5" (1.74 m)   Wt 225 lb (102.1 kg)   SpO2 98%   BMI 33.71 kg/m    Physical Exam  Constitutional: She is oriented to person, place, and time. She appears well-developed and well-nourished. No distress.  HENT:  Head: Normocephalic and atraumatic.  Mouth/Throat: Oropharynx is clear and moist. No oropharyngeal exudate.  Eyes: Pupils are equal, round, and reactive to light. EOM are normal.  Neck: Trachea normal, normal range of motion and full passive range of motion without pain. Neck supple. No JVD present. Carotid bruit is not present. No tracheal deviation present.  Cardiovascular: Normal rate, regular rhythm, normal heart sounds, intact distal pulses and normal pulses. Exam reveals no gallop and no friction rub.  No murmur heard. Pulses:      Dorsalis pedis pulses are 2+ on the right side, and 2+ on the left side.       Posterior tibial pulses are 2+ on the right side, and 2+ on the left side.  EKG is within normal limits.   Pulmonary/Chest: Effort normal and breath sounds normal. No respiratory distress. She has no decreased breath sounds. She has no wheezes.  She has no rhonchi. She has no rales. She exhibits no tenderness.  Abdominal: Soft. Bowel sounds are normal. There is tenderness.  Mild suprapubic abdominal pain.   Musculoskeletal: Normal range of motion.       Right foot: There is normal range of motion and no deformity.       Left foot: There is normal range of motion and no deformity.  Feet:  Right Foot:  Protective Sensation: 10 sites tested. 10 sites sensed.  Left Foot:  Protective Sensation: 10 sites tested. 10 sites sensed.  Lymphadenopathy:    She has no cervical adenopathy.  Neurological: She is alert and oriented to person, place, and time. No cranial nerve deficit.  Skin: Skin is warm and dry. Capillary refill takes less than 2 seconds. She is not diaphoretic.  Psychiatric: Her behavior is normal.  Judgment and thought content normal. Her mood appears anxious.  Nursing note and vitals reviewed.    LABS: Recent Results (from the past 2160 hour(s))  POCT HgB A1C     Status: Abnormal   Collection Time: 02/08/18  2:54 PM  Result Value Ref Range   Hemoglobin A1C 6.8   Protime-INR     Status: None   Collection Time: 03/04/18 12:00 AM  Result Value Ref Range   INR  0.9 - 1.1    Assessment/Plan: 1. Encounter for general adult medical examination with abnormal findings Annual wellness visit today.   2. Chest pain, unspecified type ECG within normal limits. Has seen cardiology for atypical chest pain in the past and would like a new referral. New referral to cardiology wa made today.  - Ambulatory referral to Cardiology  3. SOB (shortness of breath) EKG within normal limits. Refer to cardiology per patient request.  - EKG 12-Lead  4. Left lower quadrant abdominal tenderness without rebound tenderness Will get pelvic ultrasound for further evaluation.  - US Pelvis Limited; Future  5. Essential (primary) hypertension Stable. Continue bp medication as prescribed.   6. Diabetes mellitus without complication  ([place]) Sugars well controlled.   General Counseling: evelette hollern understanding of the findings of todays visit and agrees with plan of treatment. I have discussed any further diagnostic evaluation that may be needed or ordered today. We also reviewed her medications today. she has been encouraged to call the office with any questions or concerns that should arise related to todays visit.    Counseling:  This patient was seen by Leretha Pol, FNP- C in Collaboration with Dr Lavera Guise as a part of collaborative care agreement   Orders Placed This Encounter  Procedures  . US Pelvis Limited  . Ambulatory referral to Cardiology  . EKG 12-Lead     Time spent: [place], MD  Internal Medicine

## 2018-03-11 ENCOUNTER — Other Ambulatory Visit: Payer: Self-pay

## 2018-03-11 MED ORDER — DEXLANSOPRAZOLE 60 MG PO CPDR: 60.0000 mg | DELAYED_RELEASE_CAPSULE | Freq: Every day | ORAL | 5 refills | Status: DC

## 2018-03-11 NOTE — Telephone Encounter (Signed)
Pt  Called that heather gave her dexilant she like it as per heather send med and d/c nexium

## 2018-03-16 ENCOUNTER — Telehealth: Payer: Self-pay | Admitting: Nurse Practitioner

## 2018-03-16 NOTE — Telephone Encounter (Signed)
[facility] HAS BEEN APPROVED PHARMACY NOTIFIED

## 2018-03-17 ENCOUNTER — Other Ambulatory Visit: Payer: Self-pay

## 2018-03-17 MED ORDER — PANTOPRAZOLE SODIUM 40 MG PO TBEC: 40.0000 mg | DELAYED_RELEASE_TABLET | Freq: Every day | ORAL | 1 refills | Status: DC

## 2018-03-17 NOTE — Telephone Encounter (Signed)
Pt called dexillant is very expensive and she was on Protonix before as per heather send Protonix

## 2018-03-23 ENCOUNTER — Encounter: Payer: Self-pay | Admitting: Nurse Practitioner

## 2018-03-23 LAB — PROTIME-INR

## 2018-03-23 NOTE — Progress Notes (Signed)
Scanned lab result done on 03/04/18

## 2018-03-23 NOTE — Progress Notes (Signed)
This result is for microalbumin. How do I change the title.

## 2018-03-24 DIAGNOSIS — R10814 Left lower quadrant abdominal tenderness: Secondary | ICD-10-CM | POA: Insufficient documentation

## 2018-03-24 DIAGNOSIS — Z1239 Encounter for other screening for malignant neoplasm of breast: Secondary | ICD-10-CM | POA: Insufficient documentation

## 2018-03-28 ENCOUNTER — Other Ambulatory Visit: Payer: Self-pay | Admitting: Internal Medicine

## 2018-03-28 DIAGNOSIS — N959 Unspecified menopausal and perimenopausal disorder: Secondary | ICD-10-CM

## 2018-04-02 ENCOUNTER — Ambulatory Visit: Payer: 59

## 2018-04-02 ENCOUNTER — Encounter (INDEPENDENT_AMBULATORY_CARE_PROVIDER_SITE_OTHER): Payer: Self-pay

## 2018-04-02 DIAGNOSIS — R10814 Left lower quadrant abdominal tenderness: Secondary | ICD-10-CM | POA: Diagnosis not present

## 2018-04-06 ENCOUNTER — Ambulatory Visit: Payer: Self-pay | Admitting: Nurse Practitioner

## 2018-04-08 ENCOUNTER — Other Ambulatory Visit: Payer: Self-pay | Admitting: Nurse Practitioner

## 2018-04-08 ENCOUNTER — Telehealth: Payer: Self-pay

## 2018-04-08 DIAGNOSIS — F411 Generalized anxiety disorder: Secondary | ICD-10-CM

## 2018-04-08 MED ORDER — ALPRAZOLAM 0.5 MG PO TABS: ORAL_TABLET | ORAL | 1 refills | Status: DC

## 2018-04-08 NOTE — Telephone Encounter (Signed)
PT ADVISED WE SEND ALPRAZOLAM TO HER PHAR

## 2018-04-08 NOTE — Telephone Encounter (Signed)
Updated prescription to refect BID dosing and sent to pharmacy as 90 day prescription.

## 2018-04-08 NOTE — Progress Notes (Signed)
Updated prescription to refect BID dosing and sent to pharmacy as 90 day prescription.

## 2018-04-12 ENCOUNTER — Encounter: Payer: Self-pay | Admitting: Nurse Practitioner

## 2018-04-12 ENCOUNTER — Ambulatory Visit: Payer: 59 | Admitting: Nurse Practitioner

## 2018-04-12 VITALS — BP 123/75 | HR 74 | Resp 16 | Ht 68.5 in | Wt 229.2 lb

## 2018-04-12 DIAGNOSIS — R10814 Left lower quadrant abdominal tenderness: Secondary | ICD-10-CM

## 2018-04-12 DIAGNOSIS — R590 Localized enlarged lymph nodes: Secondary | ICD-10-CM | POA: Diagnosis not present

## 2018-04-12 NOTE — Progress Notes (Signed)
[facility] [place], [place] [address]  Internal MEDICINE  Office Visit Note  Patient Name: Valerie Ramos  [id number]  [id number]  Date of Service: 05/08/2018  Chief Complaint  Patient presents with  . other    3wk follow up    The patient was having tenderness in left lower quadrant of the abdomen. She did not complain of nausea, vomiting, or diarrhea. She did have pelvic ultrasound since her last visit. There are bilateral lymph nodes present without other abnormalities. She is here today to discuss the results.       Current Medication: Outpatient Encounter Medications as of 04/12/2018  Medication Sig Note  . acetaminophen (TYLENOL) 325 MG tablet Take 2 tablets (650 mg total) by mouth every 6 (six) hours as needed for mild pain (or Fever >/= 101).   Marland Kitchen ALPRAZolam (XANAX) 0.5 MG tablet TAKE 1 TABLET TWICE A DAY AS NEEDED FOR ANXIETY   . amoxicillin-clavulanate (AUGMENTIN) 875-125 MG tablet Take 1 tablet by mouth 2 (two) times daily.   . chlorpheniramine-HYDROcodone (TUSSIONEX PENNKINETIC ER) 10-8 MG/5ML SUER Take 5 mLs by mouth every 12 (twelve) hours as needed for cough.   . DULoxetine (CYMBALTA) 30 MG capsule Take 30 mg by mouth daily.   . famotidine (PEPCID) 20 MG tablet Take 1 tablet (20 mg total) by mouth 2 (two) times daily.   Marland Kitchen glucose blood (ONE TOUCH ULTRA TEST) test strip FOR ONCE DAILY TESTING DX. E11.65   . hydrochlorothiazide (MICROZIDE) 12.5 MG capsule TAKE 1 CAPSULE EVERY DAY FOR BLOOD PRESSURE 05/24/2015: Received from: External Pharmacy  . ibuprofen (ADVIL,MOTRIN) 600 MG tablet Take 1 tablet (600 mg total) by mouth 3 (three) times daily as needed.   Marland Kitchen ibuprofen (ADVIL,MOTRIN) 800 MG tablet Take 1 tablet (800 mg total) by mouth 3 (three) times daily as needed.   . metFORMIN (GLUCOPHAGE) 500 MG tablet Take 250 mg by mouth 2 (two) times daily with a meal. 05/03/2017: Pt takes 1/2 tablet of 500mg  for a 250mg  dose BID.  Marland Kitchen metoCLOPramide (REGLAN) 10  MG tablet TAKE 1 TABLET THREE TIMES A DAY AS NEEDED FOR REFLUX 05/24/2015: Received from: External Pharmacy  . ONETOUCH DELICA LANCETS FINE MISC Use as directed to check blood sugars once daily   . pantoprazole (PROTONIX) 40 MG tablet Take 1 tablet (40 mg total) by mouth daily.   Marland Kitchen PREMARIN 0.9 MG tablet 1 TABLET, COATED BY MOUTH ONCE A DAY   . traMADol (ULTRAM) 50 MG tablet TAKE 1 TO 2 TABLETS BY MOUTH 3 TIMES A DAY AS NEEDED FOR PAIN   . zolpidem (AMBIEN) 10 MG tablet Take 1 tablet (10 mg total) by mouth at bedtime as needed for sleep.    No facility-administered encounter medications on file as of 04/12/2018.     Surgical History: Past Surgical History:  Procedure Laterality Date  . ABDOMINAL HYSTERECTOMY    . CESAREAN SECTION  1989  . CYSTECTOMY  2000   Ovarian  . OOPHORECTOMY    . OVARIAN CYST SURGERY  2006  . RECTOVAGINAL FISTULA CLOSURE  1980    Medical History: Past Medical History:  Diagnosis Date  . Anxiety   . Arthritis   . Depression   . Diabetes 1.5, managed as type 2 ([place])   . Hypertension   . Insomnia   . Migraines   . Reflux   . Sinusitis     Family History: Family History  Problem Relation Age of Onset  . Hodgkin's lymphoma  Mother   . Diabetes Mother   . Thyroid disease Mother   . Glaucoma Mother   . Stroke Brother   . Heart disease Brother     Social History   Socioeconomic History  . Marital status: Married    Spouse name: Not on file  . Number of children: Not on file  . Years of education: Not on file  . Highest education level: Not on file  Occupational History  . Not on file  Social Needs  . Financial resource strain: Not on file  . Food insecurity:    Worry: Not on file    Inability: Not on file  . Transportation needs:    Medical: Not on file    Non-medical: Not on file  Tobacco Use  . Smoking status: Former Smoker    Last attempt to quit: 05/24/2011    Years since quitting: 6.9  . Smokeless tobacco: Never Used  Substance and  Sexual Activity  . Alcohol use: No  . Drug use: No  . Sexual activity: Not on file  Lifestyle  . Physical activity:    Days per week: Not on file    Minutes per session: Not on file  . Stress: Not on file  Relationships  . Social connections:    Talks on phone: Not on file    Gets together: Not on file    Attends religious service: Not on file    Active member of club or organization: Not on file    Attends meetings of clubs or organizations: Not on file    Relationship status: Not on file  . Intimate partner violence:    Fear of current or ex partner: Not on file    Emotionally abused: Not on file    Physically abused: Not on file    Forced sexual activity: Not on file  Other Topics Concern  . Not on file  Social History Narrative  . Not on file      Review of Systems  Constitutional: Positive for fatigue. Negative for activity change, chills and unexpected weight change.  HENT: Negative for congestion, postnasal drip, rhinorrhea, sneezing and sore throat.   Eyes: Negative.  Negative for redness.  Respiratory: Negative for cough, chest tightness, shortness of breath and wheezing.   Cardiovascular: Negative for chest pain and palpitations.  Gastrointestinal: Negative for abdominal pain, constipation, diarrhea, nausea and vomiting.  Endocrine: Negative for cold intolerance, heat intolerance, polydipsia, polyphagia and polyuria.       Blood sugars doing well    Genitourinary: Positive for pelvic pain. Negative for dysuria, frequency and hematuria.  Musculoskeletal: Negative for arthralgias, back pain, joint swelling and neck pain.  Skin: Negative for rash.       Hair falling out in clumps over past few months  Allergic/Immunologic: Negative for environmental allergies.  Neurological: Positive for headaches. Negative for tremors and numbness.  Hematological: Negative for adenopathy. Does not bruise/bleed easily.  Psychiatric/Behavioral: Positive for dysphoric mood.  Negative for behavioral problems (Depression), sleep disturbance and suicidal ideas. The patient is nervous/anxious.     Vital Signs: BP 123/75 (BP Location: Right Arm, Patient Position: Sitting, Cuff Size: Large)   Pulse 74   Resp 16   Ht 5' 8.5" (1.74 m)   Wt 229 lb 3.2 oz (104 kg)   SpO2 97%   BMI 34.34 kg/m    Physical Exam  Constitutional: She is oriented to person, place, and time. She appears well-developed and well-nourished. No distress.  HENT:  Head: Normocephalic and atraumatic.  Nose: Nose normal.  Mouth/Throat: Oropharynx is clear and moist. No oropharyngeal exudate.  Eyes: Pupils are equal, round, and reactive to light. Conjunctivae and EOM are normal.  Neck: Normal range of motion. Neck supple. No JVD present. No tracheal deviation present. No thyromegaly present.  Cardiovascular: Normal rate, regular rhythm and normal heart sounds. Exam reveals no gallop and no friction rub.  No murmur heard. Pulmonary/Chest: Effort normal and breath sounds normal. No respiratory distress. She has no wheezes. She has no rales. She exhibits no tenderness.  Abdominal: Soft. Bowel sounds are normal. There is tenderness in the suprapubic area. There is no rigidity and no guarding.  Musculoskeletal: Normal range of motion.  Lymphadenopathy:    She has no cervical adenopathy.  Neurological: She is alert and oriented to person, place, and time. No cranial nerve deficit.  Skin: Skin is warm and dry. Capillary refill takes less than 2 seconds. She is not diaphoretic.  Psychiatric: She has a normal mood and affect. Her behavior is normal. Judgment and thought content normal.  Nursing note and vitals reviewed.  Assessment/Plan: 1. Left lower quadrant abdominal tenderness without rebound tenderness Bilateral, inguinal lymph nodes present on pelvic ultrasound. Will get repeat ultrasound in 6 months to evvaluate further. Referral to be made as indicated.  - US Pelvis Limited; Future  2.  Lymphadenopathy, inguinal Bilateral, inguinal lymph nodes present on pelvic ultrasound. Will get repeat ultrasound in 6 months to evvaluate further. Referral to be made as indicated.    General Counseling: Valerie Ramos understanding of the findings of todays visit and agrees with plan of treatment. I have discussed any further diagnostic evaluation that may be needed or ordered today. We also reviewed her medications today. she has been encouraged to call the office with any questions or concerns that should arise related to todays visit.    Orders Placed This Encounter  Procedures  . US Pelvis Limited    This patient was seen by Leretha Pol FNP Collaboration with Dr Lavera Guise as a part of collaborative care agreement   Time spent: 65 Minutes   Dr Lavera Guise Internal medicine

## 2018-04-16 ENCOUNTER — Ambulatory Visit: Payer: 59

## 2018-05-08 DIAGNOSIS — R590 Localized enlarged lymph nodes: Secondary | ICD-10-CM | POA: Insufficient documentation

## 2018-06-03 ENCOUNTER — Other Ambulatory Visit: Payer: Self-pay

## 2018-06-03 MED ORDER — HYDROCHLOROTHIAZIDE 12.5 MG PO CAPS: ORAL_CAPSULE | ORAL | 3 refills | Status: DC

## 2018-06-04 ENCOUNTER — Other Ambulatory Visit: Payer: Self-pay

## 2018-06-08 ENCOUNTER — Other Ambulatory Visit: Payer: Self-pay

## 2018-06-08 MED ORDER — HYDROCHLOROTHIAZIDE 25 MG PO TABS: ORAL_TABLET | ORAL | 5 refills | Status: DC

## 2018-06-10 ENCOUNTER — Other Ambulatory Visit: Payer: Self-pay

## 2018-06-15 ENCOUNTER — Encounter: Payer: Self-pay | Admitting: Internal Medicine

## 2018-06-15 NOTE — Progress Notes (Signed)
Scanned in lab results done on 03/04/18 by [facility].

## 2018-07-09 ENCOUNTER — Telehealth: Payer: Self-pay | Admitting: Nurse Practitioner

## 2018-07-09 NOTE — Telephone Encounter (Signed)
protonix prior Josem Kaufmann has been approved by [employer].

## 2018-07-13 ENCOUNTER — Ambulatory Visit: Payer: 59

## 2018-07-28 ENCOUNTER — Telehealth: Payer: Self-pay

## 2018-07-28 NOTE — Telephone Encounter (Signed)
I haven't heard anything about a recall.

## 2018-08-13 ENCOUNTER — Ambulatory Visit: Payer: 59 | Admitting: Nurse Practitioner

## 2018-08-13 ENCOUNTER — Ambulatory Visit
Admission: RE | Admit: 2018-08-13 | Discharge: 2018-08-13 | Disposition: A | Discharge status: Home / Self Care | Payer: 59 | Source: Ambulatory Visit | Attending: Nurse Practitioner | Admitting: Nurse Practitioner

## 2018-08-13 ENCOUNTER — Encounter: Payer: Self-pay | Admitting: Nurse Practitioner

## 2018-08-13 VITALS — BP 137/80 | HR 89 | Resp 16 | Ht 68.5 in | Wt 233.0 lb

## 2018-08-13 DIAGNOSIS — E1165 Type 2 diabetes mellitus with hyperglycemia: Secondary | ICD-10-CM

## 2018-08-13 DIAGNOSIS — M47892 Other spondylosis, cervical region: Secondary | ICD-10-CM | POA: Diagnosis not present

## 2018-08-13 DIAGNOSIS — F5101 Primary insomnia: Secondary | ICD-10-CM

## 2018-08-13 DIAGNOSIS — M542 Cervicalgia: Secondary | ICD-10-CM | POA: Diagnosis present

## 2018-08-13 DIAGNOSIS — I10 Essential (primary) hypertension: Secondary | ICD-10-CM | POA: Diagnosis not present

## 2018-08-13 DIAGNOSIS — K219 Gastro-esophageal reflux disease without esophagitis: Secondary | ICD-10-CM

## 2018-08-13 DIAGNOSIS — M5000 Cervical disc disorder with myelopathy, unspecified cervical region: Secondary | ICD-10-CM

## 2018-08-13 DIAGNOSIS — M25512 Pain in left shoulder: Secondary | ICD-10-CM | POA: Diagnosis not present

## 2018-08-13 DIAGNOSIS — Z1239 Encounter for other screening for malignant neoplasm of breast: Secondary | ICD-10-CM

## 2018-08-13 LAB — POCT GLYCOSYLATED HEMOGLOBIN (HGB A1C): Hemoglobin A1C: 6.8 % — AB (ref 4.0–5.6)

## 2018-08-13 MED ORDER — CYCLOBENZAPRINE HCL 5 MG PO TABS: 5.0000 mg | ORAL_TABLET | Freq: Every evening | ORAL | 0 refills | Status: DC | PRN

## 2018-08-13 MED ORDER — METFORMIN HCL 500 MG PO TABS: 250.0000 mg | ORAL_TABLET | Freq: Two times a day (BID) | ORAL | 1 refills | Status: DC

## 2018-08-13 MED ORDER — HYDROCHLOROTHIAZIDE 25 MG PO TABS: ORAL_TABLET | ORAL | 5 refills | Status: DC

## 2018-08-13 MED ORDER — ZOLPIDEM TARTRATE 10 MG PO TABS: 10.0000 mg | ORAL_TABLET | Freq: Every evening | ORAL | 1 refills | Status: DC | PRN

## 2018-08-13 MED ORDER — PANTOPRAZOLE SODIUM 40 MG PO TBEC: 40.0000 mg | DELAYED_RELEASE_TABLET | Freq: Every day | ORAL | 1 refills | Status: DC

## 2018-08-13 NOTE — Progress Notes (Signed)
[facility] [place], [place] [address]  Internal MEDICINE  Office Visit Note  Patient Name: Valerie Ramos  [id number]  [id number]  Date of Service: 08/15/2018  Chief Complaint  Patient presents with  . Diabetes  . Hypertension  . Depression  . Quality Metric Gaps    mammo,eyeexam,pneumovax      Patient arrived to the clinic for diabetes and hypertension follow up. Her A1C is 6.8 today. She checks her blood sugar once a day in the morning. It stays in 102- 130 range. Patient states she feels dizzy sometimes and her vision gets blurry. Patient is educated to check her blood sugar and have something to eat when these episodes occur.  Patient verbalizes understanding of hypoglycemia symptoms.  Her blood pressure is currently stable. Patient reports she has left side neck pain that goes down to her scapula area and left arm. The pain started 3-4 weeks ago and is consistent. Patient describes pain as aching and stubbing. Patient tried Ibuprofen 800 mg and Asprecreme cream with minimal relief. Patient has limited range of motion in her neck and left shoulder due to pain. Patient still has normal strength. Patient reports that she still has not done eye exam but she plans to do that soon.      Current Medication: Outpatient Encounter Medications as of 08/13/2018  Medication Sig Note  . acetaminophen (TYLENOL) 325 MG tablet Take 2 tablets (650 mg total) by mouth every 6 (six) hours as needed for mild pain (or Fever >/= 101).   Marland Kitchen ALPRAZolam (XANAX) 0.5 MG tablet TAKE 1 TABLET TWICE A DAY AS NEEDED FOR ANXIETY   . DULoxetine (CYMBALTA) 30 MG capsule Take 30 mg by mouth daily.   Marland Kitchen glucose blood (ONE TOUCH ULTRA TEST) test strip FOR ONCE DAILY TESTING DX. E11.65   . hydrochlorothiazide (HYDRODIURIL) 25 MG tablet TAKE 1 TABLET(S) BY MOUTH DAILY FOR BLOOD PRESSURE/EDEMA   . ibuprofen (ADVIL,MOTRIN) 800 MG tablet Take 1 tablet (800 mg total) by mouth 3 (three) times daily as  needed.   . metFORMIN (GLUCOPHAGE) 500 MG tablet Take 0.5 tablets (250 mg total) by mouth 2 (two) times daily with a meal.   . metoCLOPramide (REGLAN) 10 MG tablet TAKE 1 TABLET THREE TIMES A DAY AS NEEDED FOR REFLUX 05/24/2015: Received from: External Pharmacy  . ONETOUCH DELICA LANCETS FINE MISC Use as directed to check blood sugars once daily   . pantoprazole (PROTONIX) 40 MG tablet Take 1 tablet (40 mg total) by mouth daily.   Marland Kitchen PREMARIN 0.9 MG tablet 1 TABLET, COATED BY MOUTH ONCE A DAY   . traMADol (ULTRAM) 50 MG tablet TAKE 1 TO 2 TABLETS BY MOUTH 3 TIMES A DAY AS NEEDED FOR PAIN   . zolpidem (AMBIEN) 10 MG tablet Take 1 tablet (10 mg total) by mouth at bedtime as needed for sleep.   . [DISCONTINUED] chlorpheniramine-HYDROcodone (TUSSIONEX PENNKINETIC ER) 10-8 MG/5ML SUER Take 5 mLs by mouth every 12 (twelve) hours as needed for cough.   . [DISCONTINUED] hydrochlorothiazide (HYDRODIURIL) 25 MG tablet TAKE 1 TABLET(S) BY MOUTH DAILY FOR BLOOD PRESSURE/EDEMA   . [DISCONTINUED] metFORMIN (GLUCOPHAGE) 500 MG tablet Take 250 mg by mouth 2 (two) times daily with a meal. 05/03/2017: Pt takes 1/2 tablet of 500mg  for a 250mg  dose BID.  . [DISCONTINUED] pantoprazole (PROTONIX) 40 MG tablet Take 1 tablet (40 mg total) by mouth daily.   . [DISCONTINUED] zolpidem (AMBIEN) 10 MG tablet Take 1 tablet (10 mg total)  by mouth at bedtime as needed for sleep.   . cyclobenzaprine (FLEXERIL) 5 MG tablet Take 1 tablet (5 mg total) by mouth at bedtime as needed for muscle spasms.   . famotidine (PEPCID) 20 MG tablet Take 1 tablet (20 mg total) by mouth 2 (two) times daily.   . [DISCONTINUED] amoxicillin-clavulanate (AUGMENTIN) 875-125 MG tablet Take 1 tablet by mouth 2 (two) times daily. (Patient not taking: Reported on 08/13/2018)   . [DISCONTINUED] ibuprofen (ADVIL,MOTRIN) 600 MG tablet Take 1 tablet (600 mg total) by mouth 3 (three) times daily as needed. (Patient not taking: Reported on 08/13/2018)    No  facility-administered encounter medications on file as of 08/13/2018.     Surgical History: Past Surgical History:  Procedure Laterality Date  . ABDOMINAL HYSTERECTOMY    . CESAREAN SECTION  1989  . CYSTECTOMY  2000   Ovarian  . OOPHORECTOMY    . OVARIAN CYST SURGERY  2006  . RECTOVAGINAL FISTULA CLOSURE  1980    Medical History: Past Medical History:  Diagnosis Date  . Anxiety   . Arthritis   . Depression   . Diabetes 1.5, managed as type 2 ([place])   . Hypertension   . Insomnia   . Migraines   . Reflux   . Sinusitis     Family History: Family History  Problem Relation Age of Onset  . Hodgkin's lymphoma Mother   . Diabetes Mother   . Thyroid disease Mother   . Glaucoma Mother   . Stroke Brother   . Heart disease Brother     Social History   Socioeconomic History  . Marital status: Married    Spouse name: Not on file  . Number of children: Not on file  . Years of education: Not on file  . Highest education level: Not on file  Occupational History  . Not on file  Social Needs  . Financial resource strain: Not on file  . Food insecurity:    Worry: Not on file    Inability: Not on file  . Transportation needs:    Medical: Not on file    Non-medical: Not on file  Tobacco Use  . Smoking status: Former Smoker    Last attempt to quit: 05/24/2011    Years since quitting: 7.2  . Smokeless tobacco: Never Used  Substance and Sexual Activity  . Alcohol use: No  . Drug use: No  . Sexual activity: Not on file  Lifestyle  . Physical activity:    Days per week: Not on file    Minutes per session: Not on file  . Stress: Not on file  Relationships  . Social connections:    Talks on phone: Not on file    Gets together: Not on file    Attends religious service: Not on file    Active member of club or organization: Not on file    Attends meetings of clubs or organizations: Not on file    Relationship status: Not on file  . Intimate partner violence:    Fear of  current or ex partner: Not on file    Emotionally abused: Not on file    Physically abused: Not on file    Forced sexual activity: Not on file  Other Topics Concern  . Not on file  Social History Narrative  . Not on file      Review of Systems  Constitutional: Positive for activity change and fatigue.       Due to  right shoulder pain  HENT: Negative for congestion, ear pain, facial swelling, hearing loss, rhinorrhea, sinus pressure, sinus pain, sore throat, trouble swallowing and voice change.   Respiratory: Negative for apnea, cough, chest tightness, shortness of breath, wheezing and stridor.   Cardiovascular: Negative for chest pain, palpitations and leg swelling.  Gastrointestinal: Negative for abdominal distention, abdominal pain, blood in stool, constipation, diarrhea, nausea and vomiting.  Endocrine: Negative for cold intolerance, heat intolerance, polydipsia, polyphagia and polyuria.       Blood sugars doing well   Musculoskeletal: Positive for back pain, myalgias and neck pain.       Left shoulder pain  Skin: Negative for color change, pallor, rash and wound.  Allergic/Immunologic: Negative for environmental allergies.  Neurological: Negative for dizziness, seizures, syncope, facial asymmetry, weakness, numbness and headaches.  Hematological: Negative for adenopathy.  Psychiatric/Behavioral: Positive for dysphoric mood. The patient is nervous/anxious.     Vital Signs: BP 137/80 (BP Location: Right Arm, Patient Position: Sitting, Cuff Size: Normal)   Pulse 89   Resp 16   Ht 5' 8.5" (1.74 m)   Wt 233 lb (105.7 kg)   SpO2 100%   BMI 34.91 kg/m    Physical Exam  Constitutional: She is oriented to person, place, and time. She appears well-developed and well-nourished.  HENT:  Head: Normocephalic and atraumatic.  Eyes: Pupils are equal, round, and reactive to light. Conjunctivae and EOM are normal. Right eye exhibits no discharge. Left eye exhibits no discharge. No  scleral icterus.  Neck: Muscular tenderness present. Decreased range of motion present. No tracheal deviation present.  Limited lateral flexion and neck rotation  Cardiovascular: Normal rate, regular rhythm and normal heart sounds. Exam reveals no gallop and no friction rub.  No murmur heard. Pulmonary/Chest: Effort normal and breath sounds normal. No stridor. No respiratory distress. She has no wheezes. She has no rales. She exhibits no tenderness.  Abdominal: Soft. Bowel sounds are normal. She exhibits no distension. There is no tenderness.  Musculoskeletal: She exhibits tenderness. She exhibits no edema or deformity.  Limited left arm abduction up to 90%, normal extension and flexion  Lymphadenopathy:    She has no cervical adenopathy.  Neurological: She is alert and oriented to person, place, and time.  Skin: Skin is warm and dry. No rash noted. No erythema. No pallor.  Psychiatric: She has a normal mood and affect. Her behavior is normal. Judgment and thought content normal.  Nursing note and vitals reviewed.   Assessment/Plan: 1. Uncontrolled type 2 diabetes mellitus with hyperglycemia (HCC) - POCT HgB A1C- POCT HgB A1C 6.8 today. Continue metformin as prescribed. Refer for diabetic eye exam.  - Ambulatory referral to Ophthalmology - metFORMIN (GLUCOPHAGE) 500 MG tablet; Take 0.5 tablets (250 mg total) by mouth 2 (two) times daily with a meal.  Dispense: 90 tablet; Refill: 1  2. Essential (primary) hypertension bp stable. Continue medications as prescribed. Refills provided today - hydrochlorothiazide (HYDRODIURIL) 25 MG tablet; TAKE 1 TABLET(S) BY MOUTH DAILY FOR BLOOD PRESSURE/EDEMA  Dispense: 30 tablet; Refill: 5  3. Cervical disc disease with myelopathy Continue ibuprofen up to three times daily as needed as prescribed. Add flexeril 5mg  at bedtime as needed to relieve tight muscles. Will x-ray cervical spine for further evaluation. Refer to orthopedics as indicated.  -  cyclobenzaprine (FLEXERIL) 5 MG tablet; Take 1 tablet (5 mg total) by mouth at bedtime as needed for muscle spasms.  Dispense: 30 tablet; Refill: 0 - DG Cervical Spine Complete; Future  4. Acute pain of left shoulder Continue ibuprofen up to three times daily as needed as prescribed. Add flexeril 5mg  at bedtime as needed to relieve tight muscles. Will x-ray cervical spine for further evaluation. Refer to orthopedics as indicated.  - DG Shoulder Left; Future  5. Gastro-esophageal reflux disease without esophagitis - pantoprazole (PROTONIX) 40 MG tablet; Take 1 tablet (40 mg total) by mouth daily.  Dispense: 90 tablet; Refill: 1  6. Primary insomnia May continue zolpidem 10mg  at bedtime as needed and as prescribed. New prescription sent to her pharmacy. - zolpidem (AMBIEN) 10 MG tablet; Take 1 tablet (10 mg total) by mouth at bedtime as needed for sleep.  Dispense: 90 tablet; Refill: 1  7. Screening for breast cancer - MM DIGITAL SCREENING BILATERAL; Future  General Counseling: sandrika schwinn understanding of the findings of todays visit and agrees with plan of treatment. I have discussed any further diagnostic evaluation that may be needed or ordered today. We also reviewed her medications today. she has been encouraged to call the office with any questions or concerns that should arise related to todays visit.  Diabetes Counseling:  1. Addition of ACE inh/ ARB'S for nephroprotection. Microalbumin is updated  2. Diabetic foot care, prevention of complications. Podiatry consult 3. Exercise and lose weight.  4. Diabetic eye examination, Diabetic eye exam is updated  5. Monitor blood sugar closlely. nutrition counseling.  6. Sign and symptoms of hypoglycemia including shaking sweating,confusion and headaches.  This patient was seen by Leretha Pol FNP Collaboration with Dr Lavera Guise as a part of collaborative care agreement  Orders Placed This Encounter  Procedures  . MM DIGITAL  SCREENING BILATERAL  . DG Cervical Spine Complete  . DG Shoulder Left  . Ambulatory referral to Ophthalmology  . POCT HgB A1C    Meds ordered this encounter  Medications  . cyclobenzaprine (FLEXERIL) 5 MG tablet    Sig: Take 1 tablet (5 mg total) by mouth at bedtime as needed for muscle spasms.    Dispense:  30 tablet    Refill:  0    Order Specific Question:   Supervising Provider    Answer:   Lavera Guise [[id number]]  . zolpidem (AMBIEN) 10 MG tablet    Sig: Take 1 tablet (10 mg total) by mouth at bedtime as needed for sleep.    Dispense:  90 tablet    Refill:  1    This is 0 day prescription    Order Specific Question:   Supervising Provider    Answer:   Lavera Guise [[id number]]  . hydrochlorothiazide (HYDRODIURIL) 25 MG tablet    Sig: TAKE 1 TABLET(S) BY MOUTH DAILY FOR BLOOD PRESSURE/EDEMA    Dispense:  30 tablet    Refill:  5    Order Specific Question:   Supervising Provider    Answer:   Lavera Guise [[id number]]  . metFORMIN (GLUCOPHAGE) 500 MG tablet    Sig: Take 0.5 tablets (250 mg total) by mouth 2 (two) times daily with a meal.    Dispense:  90 tablet    Refill:  1    Order Specific Question:   Supervising Provider    Answer:   Lavera Guise [place]  . pantoprazole (PROTONIX) 40 MG tablet    Sig: Take 1 tablet (40 mg total) by mouth daily.    Dispense:  90 tablet    Refill:  1    Order Specific Question:   Supervising Provider  AnswerLavera Guise [[id number]]    Time spent: 47 Minutes      Dr Lavera Guise Internal medicine

## 2018-08-15 DIAGNOSIS — M25511 Pain in right shoulder: Secondary | ICD-10-CM | POA: Insufficient documentation

## 2018-08-15 DIAGNOSIS — M25512 Pain in left shoulder: Secondary | ICD-10-CM | POA: Insufficient documentation

## 2018-08-15 DIAGNOSIS — E1165 Type 2 diabetes mellitus with hyperglycemia: Secondary | ICD-10-CM | POA: Insufficient documentation

## 2018-08-15 DIAGNOSIS — M5000 Cervical disc disorder with myelopathy, unspecified cervical region: Secondary | ICD-10-CM | POA: Insufficient documentation

## 2018-08-16 ENCOUNTER — Telehealth: Payer: Self-pay

## 2018-08-16 ENCOUNTER — Telehealth: Payer: Self-pay | Admitting: Nurse Practitioner

## 2018-08-16 NOTE — Telephone Encounter (Signed)
CALLED PATIENT INFORMED HER THAT HER MAMMO IS SCHEDULED FOR 08/25/18 AT 10:15 AT [email or web address]

## 2018-08-16 NOTE — Telephone Encounter (Signed)
-----   Message from Ronnell Freshwater, NP sent at 08/16/2018  8:39 AM EST ----- Please let the patient know that x-ray of left shoulder is fine. She does have moderate degenerative disease, worse n the left side of the neck, likely causing hr new symptoms.If she wants, we can set he up with orthopedics for further evaluation and treatment. Thanks.

## 2018-08-17 NOTE — Telephone Encounter (Signed)
Mailed off pt rx for physical therapy per Nira Conn

## 2018-08-17 NOTE — Telephone Encounter (Signed)
We can send her some information on gentle neck stretches and shoulder exercises. Or we can give her rx for physical therpay.

## 2018-08-17 NOTE — Telephone Encounter (Signed)
She is already on any medications that would be used to treat degenerative disc disease, which is the ibuprofen as needed and muscle relaxer at night. I have written out rx for physical therapy for her which can be mailed. I will put this on your desk.

## 2018-10-04 IMAGING — CR DG CHEST 2V
1 series · 2 of 2 positions shown · non-contrast
Comparison: 01/24/2016

CLINICAL DATA: Chest pain radiating to left arm and shortness of
breath for several days.

EXAM:
CHEST  2 VIEW

[Series 1: dg chest 2 view · 0.14mm/px · 2 of 2 slices shown]
[im 1/2]
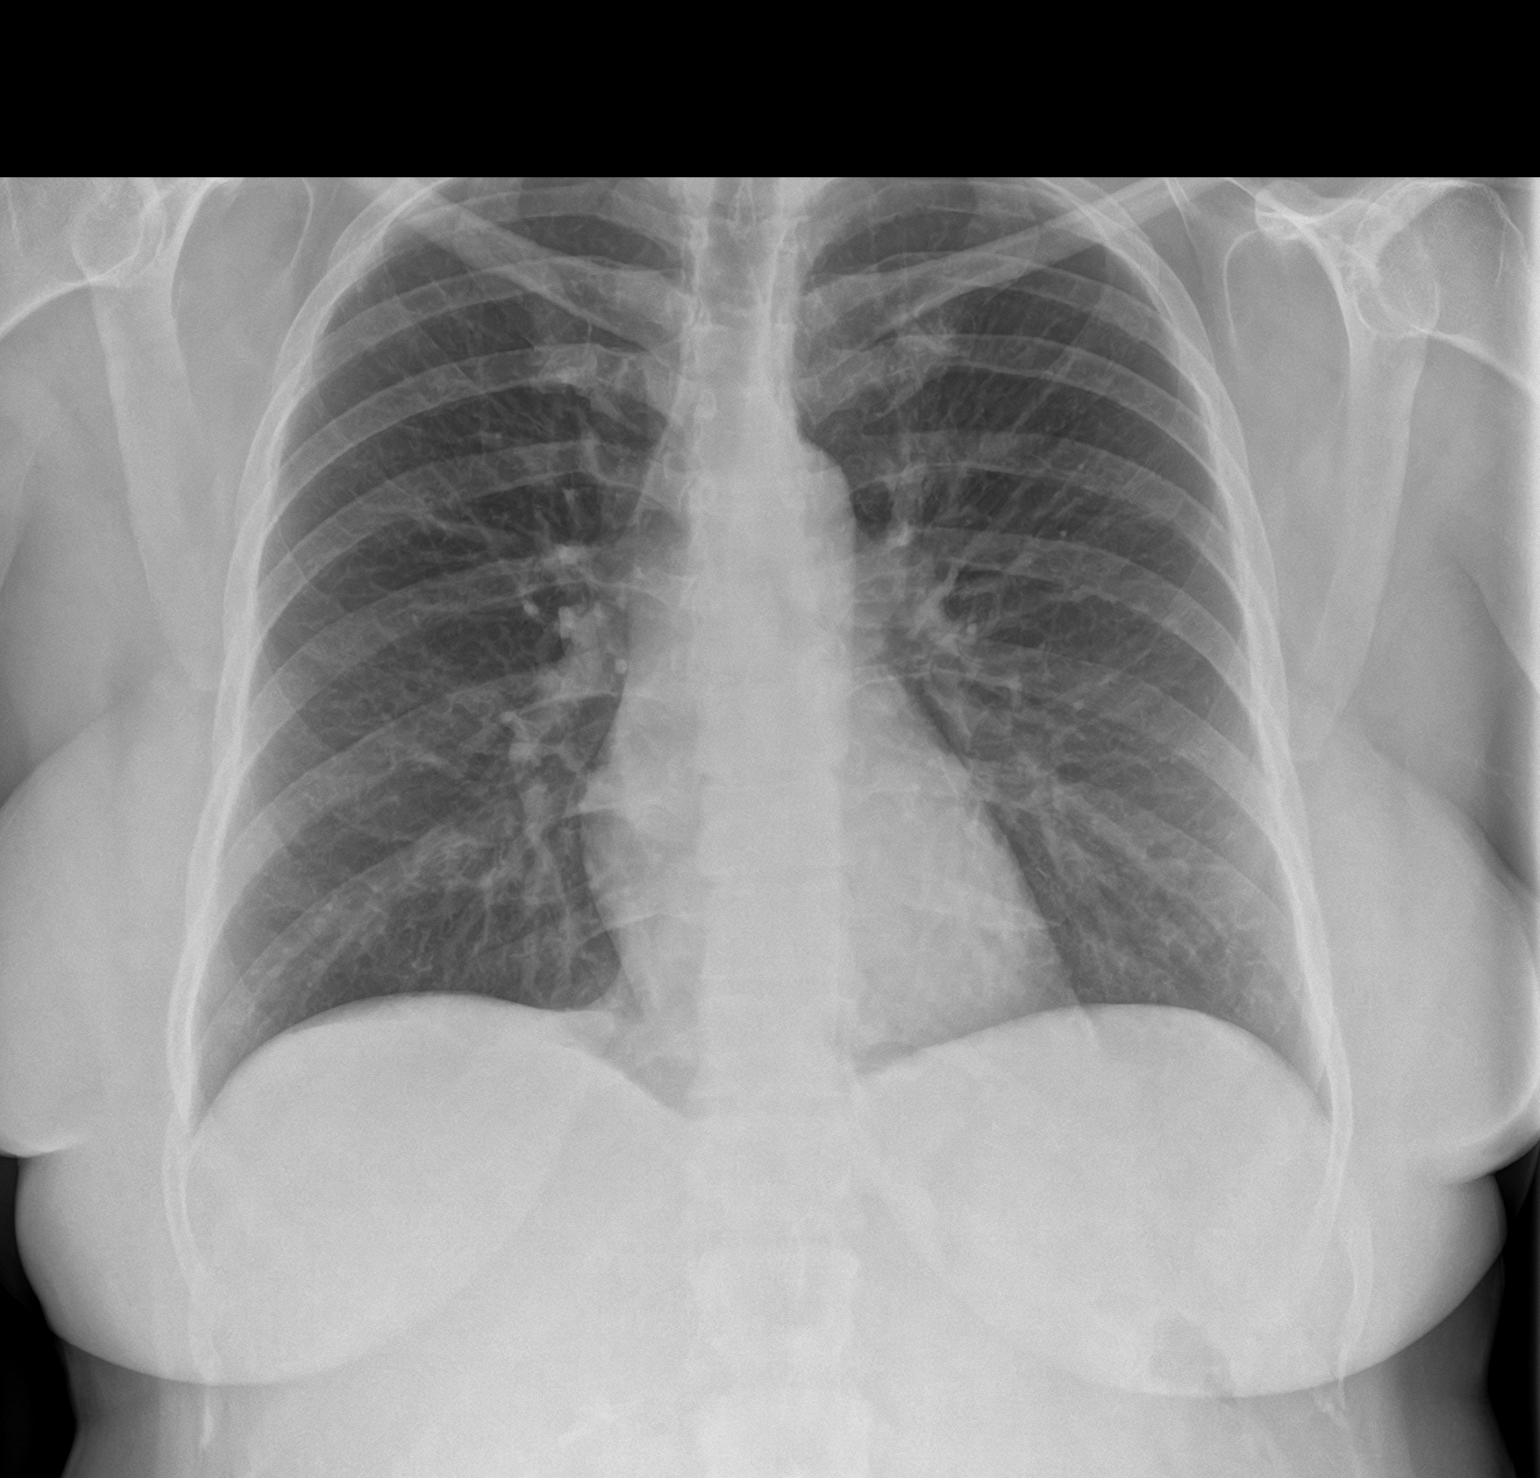
[im 2/2]
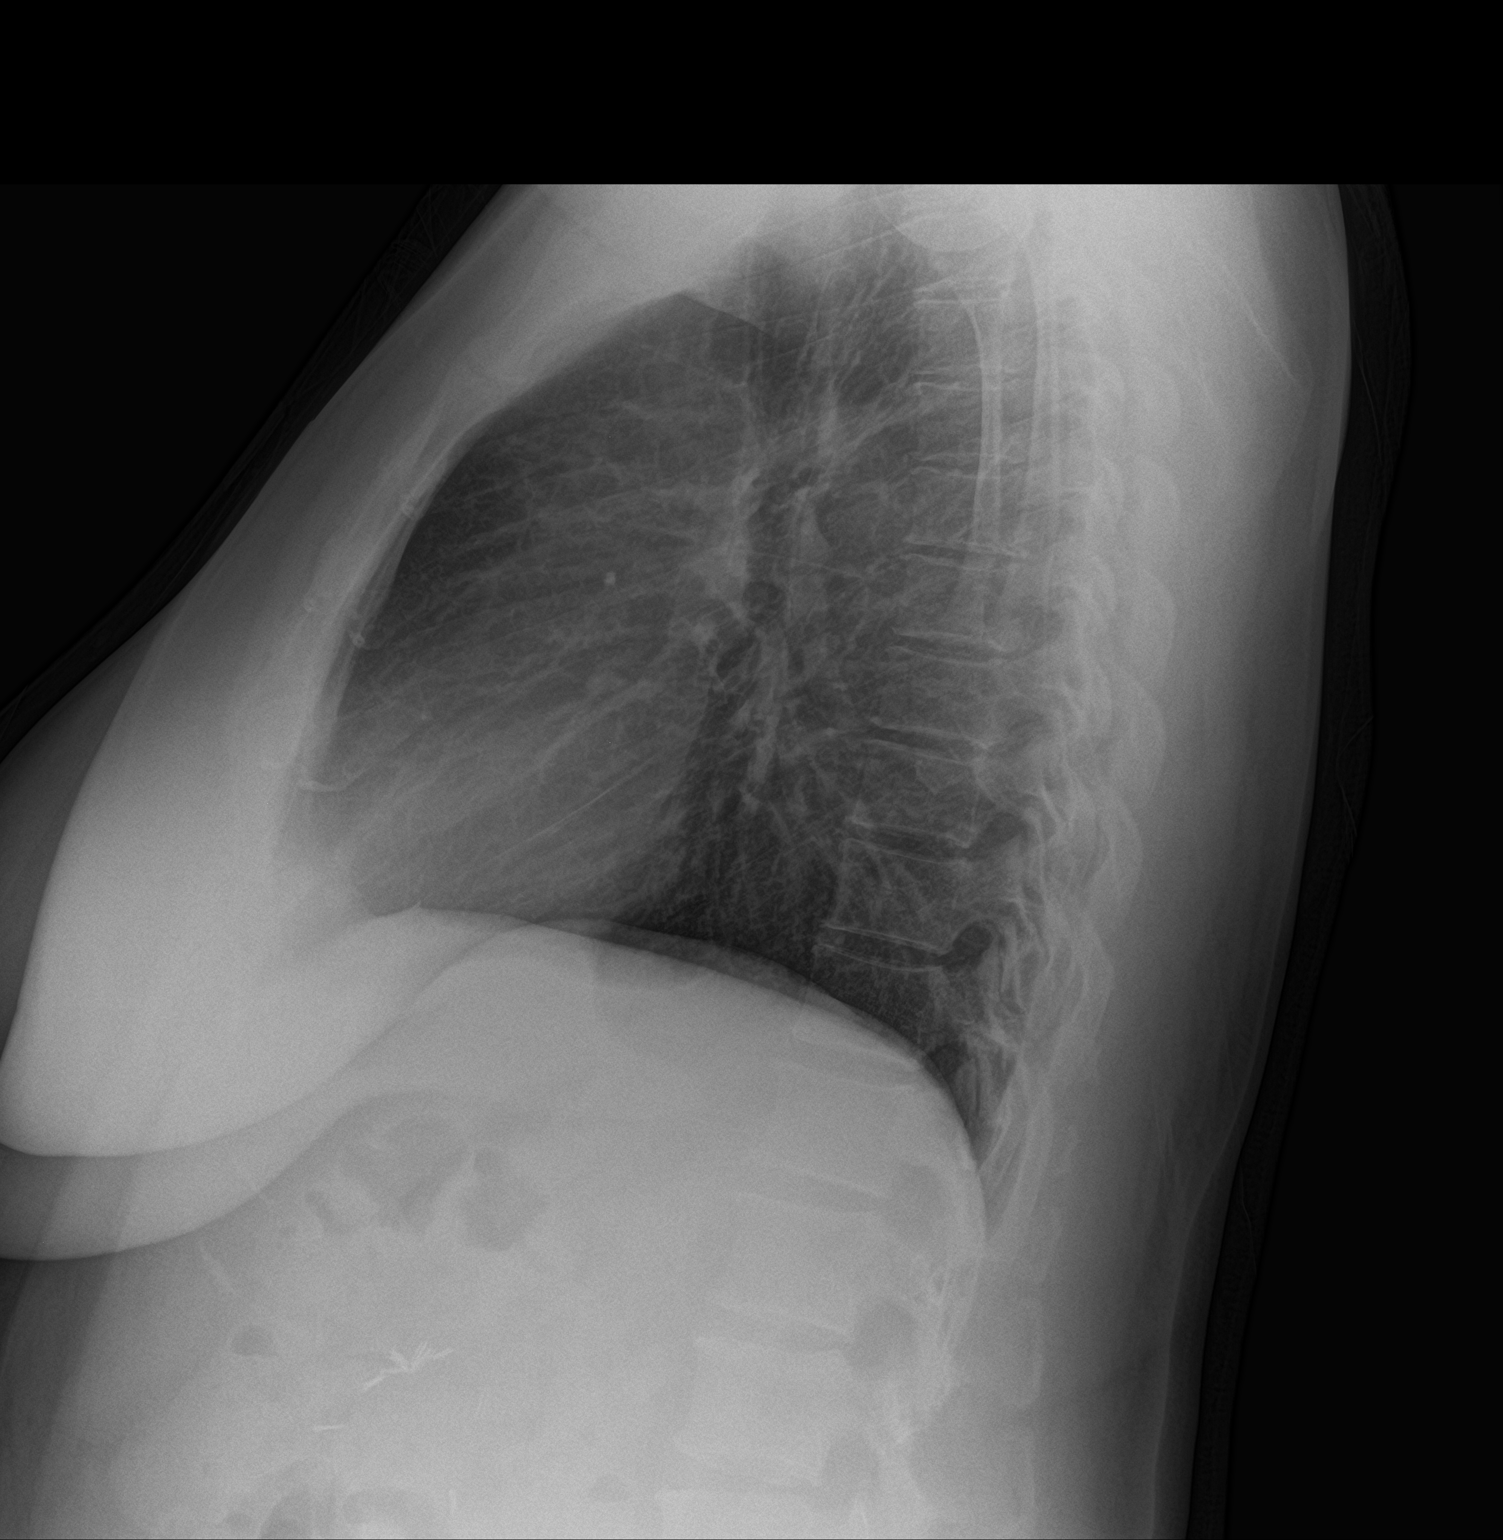

[2 of 2 positions shown; findings below may reference images not displayed]

FINDINGS: The heart size and mediastinal contours are within normal limits.
Both lungs are clear. The visualized skeletal structures are
unremarkable.
IMPRESSION: Negative.  No active cardiopulmonary disease.

## 2018-10-06 ENCOUNTER — Encounter: Payer: Self-pay | Admitting: Nurse Practitioner

## 2018-10-14 ENCOUNTER — Other Ambulatory Visit: Payer: Self-pay

## 2018-10-14 MED ORDER — GLUCOSE BLOOD VI STRP: ORAL_STRIP | 3 refills | Status: DC

## 2018-10-28 ENCOUNTER — Other Ambulatory Visit: Payer: Self-pay

## 2018-10-28 MED ORDER — ONETOUCH DELICA LANCETS 30G MISC: 1.0000 | Freq: Every day | 1 refills | Status: DC

## 2018-11-11 ENCOUNTER — Telehealth: Payer: Self-pay | Admitting: Nurse Practitioner

## 2018-11-11 NOTE — Telephone Encounter (Signed)
Patient called in regards to hospital visit yesterday, patient states that she had contrast and was told to stop her metformin for two days, spoke to [facility] and states that is normal protocol and it was fine to watch her diet for the next two days, hospital told her also to increase fluids. Patient did state that her bp was elevated , I offered to make her a appointment but she states that she has one scheduled for Monday.

## 2018-11-15 ENCOUNTER — Ambulatory Visit: Payer: Self-pay | Admitting: Nurse Practitioner

## 2018-11-18 ENCOUNTER — Encounter: Payer: Self-pay | Admitting: Nurse Practitioner

## 2018-11-18 ENCOUNTER — Ambulatory Visit: Payer: No Typology Code available for payment source | Admitting: Nurse Practitioner

## 2018-11-18 VITALS — BP 134/84 | HR 103 | Resp 16 | Ht 68.5 in | Wt 221.8 lb

## 2018-11-18 DIAGNOSIS — M5000 Cervical disc disorder with myelopathy, unspecified cervical region: Secondary | ICD-10-CM | POA: Diagnosis not present

## 2018-11-18 DIAGNOSIS — K580 Irritable bowel syndrome with diarrhea: Secondary | ICD-10-CM

## 2018-11-18 DIAGNOSIS — E1165 Type 2 diabetes mellitus with hyperglycemia: Secondary | ICD-10-CM

## 2018-11-18 DIAGNOSIS — I10 Essential (primary) hypertension: Secondary | ICD-10-CM

## 2018-11-18 LAB — POCT GLYCOSYLATED HEMOGLOBIN (HGB A1C): Hemoglobin A1C: 6.8 % — AB (ref 4.0–5.6)

## 2018-11-18 MED ORDER — IBUPROFEN 800 MG PO TABS: 800.0000 mg | ORAL_TABLET | Freq: Three times a day (TID) | ORAL | 3 refills | Status: DC | PRN

## 2018-11-18 MED ORDER — DICYCLOMINE HCL 10 MG PO CAPS: 10.0000 mg | ORAL_CAPSULE | Freq: Three times a day (TID) | ORAL | 3 refills | Status: DC

## 2018-11-18 MED ORDER — GLUCOSE BLOOD VI STRP: ORAL_STRIP | 3 refills | Status: DC

## 2018-11-18 NOTE — Progress Notes (Signed)
[facility] [place], [place] [address]  Internal MEDICINE  Office Visit Note  Patient Name: Valerie Ramos  [id number]  [id number]  Date of Service: 12/01/2018  Chief Complaint  Patient presents with  . Medical Management of Chronic Issues    3 month follow up, pt recently had a virus, pt is having a lot o fbm, everytijme she eat something she is having to go to the bathroom,   . Diabetes    A1C    Patient arrived to the clinic for diabetes and hypertension follow up. Her A1C is 6.8 today. She checks her blood sugar once a day in the morning. It stays in 102- 130 range. Patient states she feels dizzy sometimes and her vision gets blurry. Patient is educated to check her blood sugar and have something to eat when these episodes occur.  Patient verbalizes understanding of hypoglycemia symptoms.  Her blood pressure is currently stable. Patient reports she has left side neck pain that goes down to her scapula area and left arm. The pain started 3-4 weeks ago and is consistent. Patient describes pain as aching and stubbing. Patient tried Ibuprofen 800 mg and Asprecreme cream with minimal relief. Patient has limited range of motion in her neck and left shoulder due to pain. Patient still has normal strength. Patient reports that she still has not done eye exam but she plans to do that soon.      Current Medication: Outpatient Encounter Medications as of 11/18/2018  Medication Sig Note  . acetaminophen (TYLENOL) 325 MG tablet Take 2 tablets (650 mg total) by mouth every 6 (six) hours as needed for mild pain (or Fever >/= 101).   Marland Kitchen ALPRAZolam (XANAX) 0.5 MG tablet TAKE 1 TABLET TWICE A DAY AS NEEDED FOR ANXIETY   . cyclobenzaprine (FLEXERIL) 5 MG tablet Take 1 tablet (5 mg total) by mouth at bedtime as needed for muscle spasms.   . DULoxetine (CYMBALTA) 30 MG capsule Take 30 mg by mouth daily.   Marland Kitchen glucose blood (ONE TOUCH ULTRA TEST) test strip FOR ONCE DAILY TESTING DX.  E11.65   . hydrochlorothiazide (HYDRODIURIL) 25 MG tablet TAKE 1 TABLET(S) BY MOUTH DAILY FOR BLOOD PRESSURE/EDEMA   . ibuprofen (ADVIL,MOTRIN) 800 MG tablet Take 1 tablet (800 mg total) by mouth 3 (three) times daily as needed.   . metFORMIN (GLUCOPHAGE) 500 MG tablet Take 0.5 tablets (250 mg total) by mouth 2 (two) times daily with a meal.   . metoCLOPramide (REGLAN) 10 MG tablet TAKE 1 TABLET THREE TIMES A DAY AS NEEDED FOR REFLUX 05/24/2015: Received from: External Pharmacy  . ONETOUCH DELICA LANCETS 43P MISC 1 each by Does not apply route daily. Use as directed once daily diag e11.65   . pantoprazole (PROTONIX) 40 MG tablet Take 1 tablet (40 mg total) by mouth daily.   Marland Kitchen PREMARIN 0.9 MG tablet 1 TABLET, COATED BY MOUTH ONCE A DAY   . traMADol (ULTRAM) 50 MG tablet TAKE 1 TO 2 TABLETS BY MOUTH 3 TIMES A DAY AS NEEDED FOR PAIN   . zolpidem (AMBIEN) 10 MG tablet Take 1 tablet (10 mg total) by mouth at bedtime as needed for sleep.   . [DISCONTINUED] glucose blood (ONE TOUCH ULTRA TEST) test strip FOR ONCE DAILY TESTING DX. E11.65   . [DISCONTINUED] ibuprofen (ADVIL,MOTRIN) 800 MG tablet Take 1 tablet (800 mg total) by mouth 3 (three) times daily as needed.   . dicyclomine (BENTYL) 10 MG capsule Take 1 capsule (  10 mg total) by mouth 3 (three) times daily before meals.   . famotidine (PEPCID) 20 MG tablet Take 1 tablet (20 mg total) by mouth 2 (two) times daily.    No facility-administered encounter medications on file as of 11/18/2018.     Surgical History: Past Surgical History:  Procedure Laterality Date  . ABDOMINAL HYSTERECTOMY    . CESAREAN SECTION  1989  . CYSTECTOMY  2000   Ovarian  . OOPHORECTOMY    . OVARIAN CYST SURGERY  2006  . RECTOVAGINAL FISTULA CLOSURE  1980    Medical History: Past Medical History:  Diagnosis Date  . Anxiety   . Arthritis   . Depression   . Diabetes 1.5, managed as type 2 ([place])   . Hypertension   . Insomnia   . Migraines   . Reflux   . Sinusitis      Family History: Family History  Problem Relation Age of Onset  . Hodgkin's lymphoma Mother   . Diabetes Mother   . Thyroid disease Mother   . Glaucoma Mother   . Stroke Brother   . Heart disease Brother     Social History   Socioeconomic History  . Marital status: Married    Spouse name: Not on file  . Number of children: Not on file  . Years of education: Not on file  . Highest education level: Not on file  Occupational History  . Not on file  Social Needs  . Financial resource strain: Not on file  . Food insecurity:    Worry: Not on file    Inability: Not on file  . Transportation needs:    Medical: Not on file    Non-medical: Not on file  Tobacco Use  . Smoking status: Former Smoker    Last attempt to quit: 05/24/2011    Years since quitting: 7.5  . Smokeless tobacco: Never Used  Substance and Sexual Activity  . Alcohol use: No  . Drug use: No  . Sexual activity: Not on file  Lifestyle  . Physical activity:    Days per week: Not on file    Minutes per session: Not on file  . Stress: Not on file  Relationships  . Social connections:    Talks on phone: Not on file    Gets together: Not on file    Attends religious service: Not on file    Active member of club or organization: Not on file    Attends meetings of clubs or organizations: Not on file    Relationship status: Not on file  . Intimate partner violence:    Fear of current or ex partner: Not on file    Emotionally abused: Not on file    Physically abused: Not on file    Forced sexual activity: Not on file  Other Topics Concern  . Not on file  Social History Narrative  . Not on file      Review of Systems  Constitutional: Positive for fatigue. Negative for activity change.  HENT: Negative for congestion, ear pain, facial swelling, hearing loss, rhinorrhea, sinus pressure, sinus pain, sore throat, trouble swallowing and voice change.   Respiratory: Negative for apnea, cough, chest  tightness, shortness of breath, wheezing and stridor.   Cardiovascular: Negative for chest pain and palpitations.  Gastrointestinal: Positive for diarrhea. Negative for abdominal distention, abdominal pain, blood in stool, constipation, nausea and vomiting.       Having intestinal cramping and loose stools several times per  day. Seems to be worse after eating .  Endocrine: Negative for cold intolerance, heat intolerance, polydipsia, polyphagia and polyuria.       Blood sugars doing well   Musculoskeletal: Positive for back pain, myalgias and neck pain.       Left shoulder pain  Allergic/Immunologic: Negative for environmental allergies.  Neurological: Negative for dizziness, seizures and headaches.  Hematological: Negative for adenopathy.  Psychiatric/Behavioral: Positive for dysphoric mood. The patient is nervous/anxious.     Today's Vitals   11/18/18 1410  BP: 134/84  Pulse: (!) 103  Resp: 16  SpO2: 98%  Weight: 221 lb 12.8 oz (100.6 kg)  Height: 5' 8.5" (1.74 m)   Body mass index is 33.23 kg/m.  Physical Exam Vitals signs and nursing note reviewed.  Constitutional:      General: She is not in acute distress.    Appearance: Normal appearance. She is well-developed. She is not diaphoretic.  HENT:     Head: Normocephalic and atraumatic.     Nose: Nose normal.     Mouth/Throat:     Pharynx: No oropharyngeal exudate.  Eyes:     Conjunctiva/sclera: Conjunctivae normal.     Pupils: Pupils are equal, round, and reactive to light.  Neck:     Musculoskeletal: Normal range of motion and neck supple.     Thyroid: No thyromegaly.     Vascular: No JVD.     Trachea: No tracheal deviation.  Cardiovascular:     Rate and Rhythm: Normal rate and regular rhythm.     Heart sounds: Normal heart sounds. No murmur. No friction rub. No gallop.   Pulmonary:     Effort: Pulmonary effort is normal. No respiratory distress.     Breath sounds: Normal breath sounds. No wheezing or rales.   Chest:     Chest wall: No tenderness.  Abdominal:     General: Bowel sounds are normal.     Palpations: Abdomen is soft. Abdomen is not rigid.     Tenderness: There is abdominal tenderness in the suprapubic area. There is no guarding.  Musculoskeletal: Normal range of motion.  Lymphadenopathy:     Cervical: No cervical adenopathy.  Skin:    General: Skin is warm and dry.     Capillary Refill: Capillary refill takes less than 2 seconds.  Neurological:     Mental Status: She is alert and oriented to person, place, and time.     Cranial Nerves: No cranial nerve deficit.  Psychiatric:        Behavior: Behavior normal.        Thought Content: Thought content normal.        Judgment: Judgment normal.   Assessment/Plan:  1. Uncontrolled type 2 diabetes mellitus with hyperglycemia (HCC) - POCT HgB A1C 6.8 today. Continue diabetic mecication as prescribed.  - glucose blood (ONE TOUCH ULTRA TEST) test strip; FOR ONCE DAILY TESTING DX. E11.65  Dispense: 100 each; Refill: 3  2. Irritable bowel syndrome with diarrhea Dicyclomine 10mg  up to three times daily f needed for intestinal cramps and loose stools. Avoid trigger foods.  - dicyclomine (BENTYL) 10 MG capsule; Take 1 capsule (10 mg total) by mouth 3 (three) times daily before meals.  Dispense: 60 capsule; Refill: 3  3. Cervical disc disease with myelopathy May take ibuprofen 800mg  up to three times daily if needed to reduce pain and inflammation.  - ibuprofen (ADVIL,MOTRIN) 800 MG tablet; Take 1 tablet (800 mg total) by mouth 3 (three) times daily as  needed.  Dispense: 90 tablet; Refill: 3  4. Essential (primary) hypertension Stable. Continue bp medication as prescribed .  General Counseling: irish breisch understanding of the findings of todays visit and agrees with plan of treatment. I have discussed any further diagnostic evaluation that may be needed or ordered today. We also reviewed her medications today. she has been encouraged  to call the office with any questions or concerns that should arise related to todays visit.  Diabetes Counseling:  1. Addition of ACE inh/ ARB'S for nephroprotection. Microalbumin is updated  2. Diabetic foot care, prevention of complications. Podiatry consult 3. Exercise and lose weight.  4. Diabetic eye examination, Diabetic eye exam is updated  5. Monitor blood sugar closlely. nutrition counseling.  6. Sign and symptoms of hypoglycemia including shaking sweating,confusion and headaches.  This patient was seen by Leretha Pol FNP Collaboration with Dr Lavera Guise as a part of collaborative care agreement  Orders Placed This Encounter  Procedures  . POCT HgB A1C    Meds ordered this encounter  Medications  . dicyclomine (BENTYL) 10 MG capsule    Sig: Take 1 capsule (10 mg total) by mouth 3 (three) times daily before meals.    Dispense:  60 capsule    Refill:  3    Order Specific Question:   Supervising Provider    Answer:   Lavera Guise [[id number]]  . ibuprofen (ADVIL,MOTRIN) 800 MG tablet    Sig: Take 1 tablet (800 mg total) by mouth 3 (three) times daily as needed.    Dispense:  90 tablet    Refill:  3    Order Specific Question:   Supervising Provider    Answer:   Lavera Guise [[id number]]  . glucose blood (ONE TOUCH ULTRA TEST) test strip    Sig: FOR ONCE DAILY TESTING DX. E11.65    Dispense:  100 each    Refill:  3    Order Specific Question:   Supervising Provider    Answer:   Lavera Guise [[id number]]    Time spent: 53 Minutes      Dr Lavera Guise Internal medicine

## 2018-11-19 ENCOUNTER — Other Ambulatory Visit: Payer: Self-pay

## 2018-12-01 ENCOUNTER — Ambulatory Visit: Payer: No Typology Code available for payment source | Admitting: Adult Health

## 2018-12-01 ENCOUNTER — Other Ambulatory Visit: Payer: Self-pay

## 2018-12-01 ENCOUNTER — Encounter: Payer: Self-pay | Admitting: Adult Health

## 2018-12-01 VITALS — BP 128/80 | HR 88 | Temp 98.2°F | Resp 16 | Ht 68.5 in | Wt 222.0 lb

## 2018-12-01 DIAGNOSIS — K580 Irritable bowel syndrome with diarrhea: Secondary | ICD-10-CM | POA: Insufficient documentation

## 2018-12-01 DIAGNOSIS — B373 Candidiasis of vulva and vagina: Secondary | ICD-10-CM | POA: Diagnosis not present

## 2018-12-01 DIAGNOSIS — I10 Essential (primary) hypertension: Secondary | ICD-10-CM | POA: Diagnosis not present

## 2018-12-01 DIAGNOSIS — B3731 Acute candidiasis of vulva and vagina: Secondary | ICD-10-CM

## 2018-12-01 DIAGNOSIS — J0100 Acute maxillary sinusitis, unspecified: Secondary | ICD-10-CM | POA: Diagnosis not present

## 2018-12-01 MED ORDER — AMOXICILLIN-POT CLAVULANATE 875-125 MG PO TABS: 1.0000 | ORAL_TABLET | Freq: Two times a day (BID) | ORAL | 0 refills | Status: DC

## 2018-12-01 MED ORDER — FLUCONAZOLE 150 MG PO TABS: 150.0000 mg | ORAL_TABLET | ORAL | 1 refills | Status: DC | PRN

## 2018-12-01 NOTE — Progress Notes (Signed)
[facility] [place], [place] [address]  Internal MEDICINE  Office Visit Note  Patient Name: Valerie Ramos  [id number]  [id number]  Date of Service: 12/01/2018  Chief Complaint  Patient presents with  . Sore Throat    sunday started   . Ear Pain    left ear come and go started saturday  . Dizziness    was seen on the 20th for this , bad virus, been coming and going      HPI Pt is here for a sick visit. Pt reports 3 days of intermittent worsening sore throat, left ear pain, sinus pain and pressure and dizziness. She has not been taking OTC medications for symptoms.  She denies fever/chills at this point.     Current Medication:  Outpatient Encounter Medications as of 12/01/2018  Medication Sig Note  . acetaminophen (TYLENOL) 325 MG tablet Take 2 tablets (650 mg total) by mouth every 6 (six) hours as needed for mild pain (or Fever >/= 101).   Marland Kitchen ALPRAZolam (XANAX) 0.5 MG tablet TAKE 1 TABLET TWICE A DAY AS NEEDED FOR ANXIETY   . cyclobenzaprine (FLEXERIL) 5 MG tablet Take 1 tablet (5 mg total) by mouth at bedtime as needed for muscle spasms.   Marland Kitchen dicyclomine (BENTYL) 10 MG capsule Take 1 capsule (10 mg total) by mouth 3 (three) times daily before meals.   . DULoxetine (CYMBALTA) 30 MG capsule Take 30 mg by mouth daily.   Marland Kitchen glucose blood (ONE TOUCH ULTRA TEST) test strip FOR ONCE DAILY TESTING DX. E11.65   . hydrochlorothiazide (HYDRODIURIL) 25 MG tablet TAKE 1 TABLET(S) BY MOUTH DAILY FOR BLOOD PRESSURE/EDEMA   . ibuprofen (ADVIL,MOTRIN) 800 MG tablet Take 1 tablet (800 mg total) by mouth 3 (three) times daily as needed.   . metFORMIN (GLUCOPHAGE) 500 MG tablet Take 0.5 tablets (250 mg total) by mouth 2 (two) times daily with a meal.   . metoCLOPramide (REGLAN) 10 MG tablet TAKE 1 TABLET THREE TIMES A DAY AS NEEDED FOR REFLUX 05/24/2015: Received from: External Pharmacy  . ONETOUCH DELICA LANCETS 95M MISC 1 each by Does not apply route daily. Use as directed  once daily diag e11.65   . pantoprazole (PROTONIX) 40 MG tablet Take 1 tablet (40 mg total) by mouth daily.   Marland Kitchen PREMARIN 0.9 MG tablet 1 TABLET, COATED BY MOUTH ONCE A DAY   . traMADol (ULTRAM) 50 MG tablet TAKE 1 TO 2 TABLETS BY MOUTH 3 TIMES A DAY AS NEEDED FOR PAIN   . zolpidem (AMBIEN) 10 MG tablet Take 1 tablet (10 mg total) by mouth at bedtime as needed for sleep.   Marland Kitchen amoxicillin-clavulanate (AUGMENTIN) 875-125 MG tablet Take 1 tablet by mouth 2 (two) times daily.   . famotidine (PEPCID) 20 MG tablet Take 1 tablet (20 mg total) by mouth 2 (two) times daily.   . fluconazole (DIFLUCAN) 150 MG tablet Take 1 tablet (150 mg total) by mouth every three (3) days as needed.    No facility-administered encounter medications on file as of 12/01/2018.       Medical History: Past Medical History:  Diagnosis Date  . Anxiety   . Arthritis   . Depression   . Diabetes 1.5, managed as type 2 ([place])   . Hypertension   . Insomnia   . Migraines   . Reflux   . Sinusitis      Vital Signs: BP 128/80   Pulse 88   Temp 98.2 F (36.8  C) (Oral)   Resp 16   Ht 5' 8.5" (1.74 m)   Wt 222 lb (100.7 kg)   SpO2 96%   BMI 33.26 kg/m    Review of Systems  Constitutional: Negative for chills, fatigue and unexpected weight change.  HENT: Positive for ear pain, postnasal drip, sinus pressure, sinus pain and sore throat. Negative for congestion, rhinorrhea and sneezing.   Eyes: Negative for photophobia, pain and redness.  Respiratory: Negative for cough, chest tightness and shortness of breath.   Cardiovascular: Negative for chest pain and palpitations.  Gastrointestinal: Negative for abdominal pain, constipation, diarrhea, nausea and vomiting.  Endocrine: Negative.   Genitourinary: Negative for dysuria and frequency.  Musculoskeletal: Negative for arthralgias, back pain, joint swelling and neck pain.  Skin: Negative for rash.  Allergic/Immunologic: Negative.   Neurological: Negative for tremors  and numbness.  Hematological: Negative for adenopathy. Does not bruise/bleed easily.  Psychiatric/Behavioral: Negative for behavioral problems and sleep disturbance. The patient is not nervous/anxious.     Physical Exam Vitals signs and nursing note reviewed.  Constitutional:      General: She is not in acute distress.    Appearance: She is well-developed. She is not diaphoretic.  HENT:     Head: Normocephalic and atraumatic.     Left Ear: Tenderness present.     Mouth/Throat:     Mouth: Mucous membranes are moist. Mucous membranes are pale.     Pharynx: No oropharyngeal exudate.     Tonsils: No tonsillar exudate. Swelling: 0 on the right. 0 on the left.  Eyes:     Pupils: Pupils are equal, round, and reactive to light.  Neck:     Musculoskeletal: Normal range of motion and neck supple.     Thyroid: No thyromegaly.     Vascular: No JVD.     Trachea: No tracheal deviation.  Cardiovascular:     Rate and Rhythm: Normal rate and regular rhythm.     Heart sounds: Normal heart sounds. No murmur. No friction rub. No gallop.   Pulmonary:     Effort: Pulmonary effort is normal. No respiratory distress.     Breath sounds: Normal breath sounds. No wheezing or rales.  Chest:     Chest wall: No tenderness.  Abdominal:     Palpations: Abdomen is soft.     Tenderness: There is no abdominal tenderness. There is no guarding.  Musculoskeletal: Normal range of motion.  Lymphadenopathy:     Cervical: Cervical adenopathy present.     Right cervical: Superficial cervical adenopathy present.     Left cervical: Superficial cervical adenopathy present.  Skin:    General: Skin is warm and dry.  Neurological:     Mental Status: She is alert and oriented to person, place, and time.     Cranial Nerves: No cranial nerve deficit.  Psychiatric:        Behavior: Behavior normal.        Thought Content: Thought content normal.        Judgment: Judgment normal.    Assessment/Plan: 1. Acute  non-recurrent maxillary sinusitis Patient given course of Augmentin for backslash sinus infection.  Instructed patient to take all medication until completed.  Return to clinic 11 to 10 days if symptoms fail to improve. - amoxicillin-clavulanate (AUGMENTIN) 875-125 MG tablet; Take 1 tablet by mouth 2 (two) times daily.  Dispense: 14 tablet; Refill: 0  2. Vaginal candida Patient well with prescription for Diflucan. - fluconazole (DIFLUCAN) 150 MG tablet; Take 1 tablet (  150 mg total) by mouth every three (3) days as needed.  Dispense: 3 tablet; Refill: 1  3. Essential (primary) hypertension Stable, continue current medications.   General Counseling: levi crass understanding of the findings of todays visit and agrees with plan of treatment. I have discussed any further diagnostic evaluation that may be needed or ordered today. We also reviewed her medications today. she has been encouraged to call the office with any questions or concerns that should arise related to todays visit.   No orders of the defined types were placed in this encounter.   Meds ordered this encounter  Medications  . amoxicillin-clavulanate (AUGMENTIN) 875-125 MG tablet    Sig: Take 1 tablet by mouth 2 (two) times daily.    Dispense:  14 tablet    Refill:  0  . fluconazole (DIFLUCAN) 150 MG tablet    Sig: Take 1 tablet (150 mg total) by mouth every three (3) days as needed.    Dispense:  3 tablet    Refill:  1    Time spent:25 Minutes  This patient was seen by Orson Gear AGNP-C in Collaboration with Dr Lavera Guise as a part of collaborative care agreement.  Kendell Bane AGNP-C Internal Medicine

## 2018-12-01 NOTE — Patient Instructions (Signed)

## 2019-02-09 ENCOUNTER — Other Ambulatory Visit: Payer: Self-pay | Admitting: Nurse Practitioner

## 2019-02-09 DIAGNOSIS — F5101 Primary insomnia: Secondary | ICD-10-CM

## 2019-02-09 MED ORDER — ZOLPIDEM TARTRATE 10 MG PO TABS: 10.0000 mg | ORAL_TABLET | Freq: Every evening | ORAL | 2 refills | Status: DC | PRN

## 2019-02-09 NOTE — Progress Notes (Signed)
Approved refill request for zolpidem 10mg  at bedtime as needed per pharmacy request.

## 2019-02-11 ENCOUNTER — Other Ambulatory Visit: Payer: Self-pay

## 2019-02-11 DIAGNOSIS — I10 Essential (primary) hypertension: Secondary | ICD-10-CM

## 2019-02-11 MED ORDER — HYDROCHLOROTHIAZIDE 25 MG PO TABS: ORAL_TABLET | ORAL | 5 refills | Status: DC

## 2019-02-15 ENCOUNTER — Other Ambulatory Visit: Payer: Self-pay

## 2019-02-15 DIAGNOSIS — N959 Unspecified menopausal and perimenopausal disorder: Secondary | ICD-10-CM

## 2019-02-15 MED ORDER — ESTROGENS CONJUGATED 0.9 MG PO TABS: ORAL_TABLET | ORAL | 1 refills | Status: DC

## 2019-02-23 ENCOUNTER — Other Ambulatory Visit: Payer: Self-pay

## 2019-02-23 DIAGNOSIS — E1165 Type 2 diabetes mellitus with hyperglycemia: Secondary | ICD-10-CM

## 2019-02-23 MED ORDER — METFORMIN HCL 500 MG PO TABS: 250.0000 mg | ORAL_TABLET | Freq: Two times a day (BID) | ORAL | 0 refills | Status: DC

## 2019-03-21 ENCOUNTER — Encounter: Payer: Self-pay | Admitting: Nurse Practitioner

## 2019-03-21 ENCOUNTER — Other Ambulatory Visit: Payer: Self-pay

## 2019-03-21 ENCOUNTER — Ambulatory Visit: Payer: No Typology Code available for payment source | Admitting: Nurse Practitioner

## 2019-03-21 VITALS — BP 142/74 | HR 85 | Resp 16 | Ht 68.5 in | Wt 229.8 lb

## 2019-03-21 DIAGNOSIS — I10 Essential (primary) hypertension: Secondary | ICD-10-CM

## 2019-03-21 DIAGNOSIS — F411 Generalized anxiety disorder: Secondary | ICD-10-CM | POA: Diagnosis not present

## 2019-03-21 DIAGNOSIS — M25511 Pain in right shoulder: Secondary | ICD-10-CM

## 2019-03-21 DIAGNOSIS — G8929 Other chronic pain: Secondary | ICD-10-CM

## 2019-03-21 DIAGNOSIS — K219 Gastro-esophageal reflux disease without esophagitis: Secondary | ICD-10-CM

## 2019-03-21 DIAGNOSIS — N959 Unspecified menopausal and perimenopausal disorder: Secondary | ICD-10-CM

## 2019-03-21 DIAGNOSIS — E1165 Type 2 diabetes mellitus with hyperglycemia: Secondary | ICD-10-CM | POA: Diagnosis not present

## 2019-03-21 LAB — POCT GLYCOSYLATED HEMOGLOBIN (HGB A1C): Hemoglobin A1C: 6.9 % — AB (ref 4.0–5.6)

## 2019-03-21 MED ORDER — METFORMIN HCL 500 MG PO TABS: 250.0000 mg | ORAL_TABLET | Freq: Two times a day (BID) | ORAL | 3 refills | Status: DC

## 2019-03-21 MED ORDER — PANTOPRAZOLE SODIUM 40 MG PO TBEC: 40.0000 mg | DELAYED_RELEASE_TABLET | Freq: Every day | ORAL | 3 refills | Status: DC

## 2019-03-21 MED ORDER — ESTROGENS CONJUGATED 0.9 MG PO TABS: ORAL_TABLET | ORAL | 1 refills | Status: DC

## 2019-03-21 MED ORDER — HYDROCHLOROTHIAZIDE 25 MG PO TABS: ORAL_TABLET | ORAL | 3 refills | Status: DC

## 2019-03-21 MED ORDER — ALPRAZOLAM 0.5 MG PO TABS: ORAL_TABLET | ORAL | 3 refills | Status: DC

## 2019-03-21 NOTE — Progress Notes (Signed)
[facility] [place], [place] [address]  Internal MEDICINE  Office Visit Note  Patient Name: Valerie Ramos  [id number]  [id number]  Date of Service: 03/23/2019  Chief Complaint  Patient presents with  . Medical Management of Chronic Issues    4 month follow up  . Diabetes    A1C    The patient is here for routine follow up visit. Blood sugars are doing well. HgbA1c is 6.9 today. Blood pressure is generally well controlled. She has some pain in the right ear which started this morning. She also reports soreness in the right shoulder. ROM and strength are intact. States that it hurts to lay on her right side. Taking ibuprofen does help with this.       Current Medication: Outpatient Encounter Medications as of 03/21/2019  Medication Sig Note  . acetaminophen (TYLENOL) 325 MG tablet Take 2 tablets (650 mg total) by mouth every 6 (six) hours as needed for mild pain (or Fever >/= 101).   Marland Kitchen ALPRAZolam (XANAX) 0.5 MG tablet TAKE 1 TABLET TWICE A DAY AS NEEDED FOR ANXIETY   . cyclobenzaprine (FLEXERIL) 5 MG tablet Take 1 tablet (5 mg total) by mouth at bedtime as needed for muscle spasms.   Marland Kitchen dicyclomine (BENTYL) 10 MG capsule Take 1 capsule (10 mg total) by mouth 3 (three) times daily before meals.   . DULoxetine (CYMBALTA) 30 MG capsule Take 30 mg by mouth daily.   Marland Kitchen estrogens, conjugated, (PREMARIN) 0.9 MG tablet 1 TABLET, COATED BY MOUTH ONCE A DAY   . glucose blood (ONE TOUCH ULTRA TEST) test strip FOR ONCE DAILY TESTING DX. E11.65   . hydrochlorothiazide (HYDRODIURIL) 25 MG tablet TAKE 1 TABLET(S) BY MOUTH DAILY FOR BLOOD PRESSURE/EDEMA   . ibuprofen (ADVIL,MOTRIN) 800 MG tablet Take 1 tablet (800 mg total) by mouth 3 (three) times daily as needed.   . metFORMIN (GLUCOPHAGE) 500 MG tablet Take 0.5 tablets (250 mg total) by mouth 2 (two) times daily with a meal.   . metoCLOPramide (REGLAN) 10 MG tablet TAKE 1 TABLET THREE TIMES A DAY AS NEEDED FOR REFLUX  05/24/2015: Received from: External Pharmacy  . ONETOUCH DELICA LANCETS 83J MISC 1 each by Does not apply route daily. Use as directed once daily diag e11.65   . pantoprazole (PROTONIX) 40 MG tablet Take 1 tablet (40 mg total) by mouth daily.   . traMADol (ULTRAM) 50 MG tablet TAKE 1 TO 2 TABLETS BY MOUTH 3 TIMES A DAY AS NEEDED FOR PAIN   . zolpidem (AMBIEN) 10 MG tablet Take 1 tablet (10 mg total) by mouth at bedtime as needed for sleep.   . [DISCONTINUED] ALPRAZolam (XANAX) 0.5 MG tablet TAKE 1 TABLET TWICE A DAY AS NEEDED FOR ANXIETY   . [DISCONTINUED] estrogens, conjugated, (PREMARIN) 0.9 MG tablet 1 TABLET, COATED BY MOUTH ONCE A DAY   . [DISCONTINUED] hydrochlorothiazide (HYDRODIURIL) 25 MG tablet TAKE 1 TABLET(S) BY MOUTH DAILY FOR BLOOD PRESSURE/EDEMA   . [DISCONTINUED] metFORMIN (GLUCOPHAGE) 500 MG tablet Take 0.5 tablets (250 mg total) by mouth 2 (two) times daily with a meal.   . [DISCONTINUED] pantoprazole (PROTONIX) 40 MG tablet Take 1 tablet (40 mg total) by mouth daily.   . famotidine (PEPCID) 20 MG tablet Take 1 tablet (20 mg total) by mouth 2 (two) times daily.   . [DISCONTINUED] amoxicillin-clavulanate (AUGMENTIN) 875-125 MG tablet Take 1 tablet by mouth 2 (two) times daily. (Patient not taking: Reported on 03/21/2019)   . [  DISCONTINUED] fluconazole (DIFLUCAN) 150 MG tablet Take 1 tablet (150 mg total) by mouth every three (3) days as needed. (Patient not taking: Reported on 03/21/2019)    No facility-administered encounter medications on file as of 03/21/2019.     Surgical History: Past Surgical History:  Procedure Laterality Date  . ABDOMINAL HYSTERECTOMY    . CESAREAN SECTION  1989  . CYSTECTOMY  2000   Ovarian  . OOPHORECTOMY    . OVARIAN CYST SURGERY  2006  . RECTOVAGINAL FISTULA CLOSURE  1980    Medical History: Past Medical History:  Diagnosis Date  . Anxiety   . Arthritis   . Depression   . Diabetes 1.5, managed as type 2 ([place])   . Hypertension   . Insomnia    . Migraines   . Reflux   . Sinusitis     Family History: Family History  Problem Relation Age of Onset  . Hodgkin's lymphoma Mother   . Diabetes Mother   . Thyroid disease Mother   . Glaucoma Mother   . Stroke Brother   . Heart disease Brother     Social History   Socioeconomic History  . Marital status: Married    Spouse name: Not on file  . Number of children: Not on file  . Years of education: Not on file  . Highest education level: Not on file  Occupational History  . Not on file  Social Needs  . Financial resource strain: Not on file  . Food insecurity    Worry: Not on file    Inability: Not on file  . Transportation needs    Medical: Not on file    Non-medical: Not on file  Tobacco Use  . Smoking status: Former Smoker    Quit date: 05/24/2011    Years since quitting: 7.8  . Smokeless tobacco: Never Used  Substance and Sexual Activity  . Alcohol use: No  . Drug use: No  . Sexual activity: Not on file  Lifestyle  . Physical activity    Days per week: Not on file    Minutes per session: Not on file  . Stress: Not on file  Relationships  . Social Herbalist on phone: Not on file    Gets together: Not on file    Attends religious service: Not on file    Active member of club or organization: Not on file    Attends meetings of clubs or organizations: Not on file    Relationship status: Not on file  . Intimate partner violence    Fear of current or ex partner: Not on file    Emotionally abused: Not on file    Physically abused: Not on file    Forced sexual activity: Not on file  Other Topics Concern  . Not on file  Social History Narrative  . Not on file      Review of Systems  Constitutional: Positive for activity change and fatigue.       Due to right shoulder pain  HENT: Negative for congestion, ear pain, facial swelling, hearing loss, rhinorrhea, sinus pressure, sinus pain, sore throat, trouble swallowing and voice change.    Respiratory: Negative for apnea, cough, chest tightness, shortness of breath, wheezing and stridor.   Cardiovascular: Negative for chest pain and palpitations.  Gastrointestinal: Negative for abdominal distention, abdominal pain, blood in stool, constipation, diarrhea, nausea and vomiting.  Endocrine: Negative for cold intolerance, heat intolerance, polydipsia and polyuria.  Blood sugars doing well   Musculoskeletal: Positive for myalgias and neck pain. Negative for back pain.       Right shoulder pain. Hurts to lay on the right side.   Skin: Negative for color change, pallor, rash and wound.  Allergic/Immunologic: Negative for environmental allergies.  Neurological: Negative for dizziness, seizures, syncope, facial asymmetry, weakness, numbness and headaches.  Hematological: Negative for adenopathy.  Psychiatric/Behavioral: Positive for dysphoric mood. The patient is nervous/anxious.     Today's Vitals   03/21/19 1147  BP: (!) 142/74  Pulse: 85  Resp: 16  SpO2: 98%  Weight: 229 lb 12.8 oz (104.2 kg)  Height: 5' 8.5" (1.74 m)   Body mass index is 34.43 kg/m.  Physical Exam Vitals signs and nursing note reviewed.  Constitutional:      General: She is not in acute distress.    Appearance: Normal appearance. She is well-developed. She is not diaphoretic.  HENT:     Head: Normocephalic and atraumatic.     Nose: Nose normal.     Mouth/Throat:     Pharynx: No oropharyngeal exudate.  Eyes:     Conjunctiva/sclera: Conjunctivae normal.     Pupils: Pupils are equal, round, and reactive to light.  Neck:     Musculoskeletal: Normal range of motion and neck supple.     Thyroid: No thyromegaly.     Vascular: No JVD.     Trachea: No tracheal deviation.  Cardiovascular:     Rate and Rhythm: Normal rate and regular rhythm.     Heart sounds: Normal heart sounds. No murmur. No friction rub. No gallop.   Pulmonary:     Effort: Pulmonary effort is normal. No respiratory distress.      Breath sounds: Normal breath sounds. No wheezing or rales.  Chest:     Chest wall: No tenderness.  Abdominal:     General: Bowel sounds are normal.     Palpations: Abdomen is soft. Abdomen is not rigid.     Tenderness: There is no abdominal tenderness. There is no guarding.  Musculoskeletal: Normal range of motion.     Comments: Tenderness of the right shoulder. No crepitus or palpable abnormality noted today. ROM and strength are intact.   Lymphadenopathy:     Cervical: No cervical adenopathy.  Skin:    General: Skin is warm and dry.     Capillary Refill: Capillary refill takes less than 2 seconds.  Neurological:     Mental Status: She is alert and oriented to person, place, and time.     Cranial Nerves: No cranial nerve deficit.  Psychiatric:        Behavior: Behavior normal.        Thought Content: Thought content normal.        Judgment: Judgment normal.    Assessment/Plan: 1. Uncontrolled type 2 diabetes mellitus with hyperglycemia (HCC) - POCT HgB A1C 6.9 today. Continue diabetic medication as prescribed. Monitor bloodsugars closely.  - metFORMIN (GLUCOPHAGE) 500 MG tablet; Take 0.5 tablets (250 mg total) by mouth 2 (two) times daily with a meal.  Dispense: 90 tablet; Refill: 3  2. Generalized anxiety disorder May continue to take alprazolam 0.5mg  twice daily as needed for acute anxiety. Refills were sent to her pharmacy.  - ALPRAZolam (XANAX) 0.5 MG tablet; TAKE 1 TABLET TWICE A DAY AS NEEDED FOR ANXIETY  Dispense: 60 tablet; Refill: 3  3. Essential (primary) hypertension Stable. Continue blood pressure medication as prescribed.  - hydrochlorothiazide (HYDRODIURIL) 25 MG tablet; TAKE  1 TABLET(S) BY MOUTH DAILY FOR BLOOD PRESSURE/EDEMA  Dispense: 90 tablet; Refill: 3  4. Gastro-esophageal reflux disease without esophagitis Continue protonix as prescribed.  - pantoprazole (PROTONIX) 40 MG tablet; Take 1 tablet (40 mg total) by mouth daily.  Dispense: 90 tablet; Refill:  3  5. Unspecified menopausal and perimenopausal disorder - estrogens, conjugated, (PREMARIN) 0.9 MG tablet; 1 TABLET, COATED BY MOUTH ONCE A DAY  Dispense: 90 tablet; Refill: 1  6. Chronic right shoulder pain Recommend rest, NSAIDs as needed and as indicated. Heat to affected area. Continue gentle ROM exercises to improve mobility.  General Counseling: margrette wynia understanding of the findings of todays visit and agrees with plan of treatment. I have discussed any further diagnostic evaluation that may be needed or ordered today. We also reviewed her medications today. she has been encouraged to call the office with any questions or concerns that should arise related to todays visit.  Diabetes Counseling:  1. Addition of ACE inh/ ARB'S for nephroprotection. Microalbumin is updated  2. Diabetic foot care, prevention of complications. Podiatry consult 3. Exercise and lose weight.  4. Diabetic eye examination, Diabetic eye exam is updated  5. Monitor blood sugar closlely. nutrition counseling.  6. Sign and symptoms of hypoglycemia including shaking sweating,confusion and headaches.  This patient was seen by Leretha Pol FNP Collaboration with Dr Lavera Guise as a part of collaborative care agreement  Orders Placed This Encounter  Procedures  . POCT HgB A1C    Meds ordered this encounter  Medications  . metFORMIN (GLUCOPHAGE) 500 MG tablet    Sig: Take 0.5 tablets (250 mg total) by mouth 2 (two) times daily with a meal.    Dispense:  90 tablet    Refill:  3    Order Specific Question:   Supervising Provider    Answer:   Lavera Guise [place]  . ALPRAZolam (XANAX) 0.5 MG tablet    Sig: TAKE 1 TABLET TWICE A DAY AS NEEDED FOR ANXIETY    Dispense:  60 tablet    Refill:  3    Order Specific Question:   Supervising Provider    Answer:   Lavera Guise [place]  . hydrochlorothiazide (HYDRODIURIL) 25 MG tablet    Sig: TAKE 1 TABLET(S) BY MOUTH DAILY FOR BLOOD PRESSURE/EDEMA     Dispense:  90 tablet    Refill:  3    Order Specific Question:   Supervising Provider    Answer:   Lavera Guise [[id number]]  . pantoprazole (PROTONIX) 40 MG tablet    Sig: Take 1 tablet (40 mg total) by mouth daily.    Dispense:  90 tablet    Refill:  3    Order Specific Question:   Supervising Provider    Answer:   Lavera Guise [[id number]]  . estrogens, conjugated, (PREMARIN) 0.9 MG tablet    Sig: 1 TABLET, COATED BY MOUTH ONCE A DAY    Dispense:  90 tablet    Refill:  1    Order Specific Question:   Supervising Provider    Answer:   Lavera Guise [[id number]]    Time spent: 63 Minutes      Dr Lavera Guise Internal medicine

## 2019-03-23 DIAGNOSIS — G8929 Other chronic pain: Secondary | ICD-10-CM | POA: Insufficient documentation

## 2019-04-14 ENCOUNTER — Other Ambulatory Visit: Payer: Self-pay

## 2019-04-14 DIAGNOSIS — N959 Unspecified menopausal and perimenopausal disorder: Secondary | ICD-10-CM

## 2019-04-14 MED ORDER — ESTROGENS CONJUGATED 0.9 MG PO TABS: ORAL_TABLET | ORAL | 1 refills | Status: DC

## 2019-04-25 ENCOUNTER — Other Ambulatory Visit: Payer: Self-pay

## 2019-04-25 MED ORDER — METOCLOPRAMIDE HCL 10 MG PO TABS: ORAL_TABLET | ORAL | 0 refills | Status: DC

## 2019-04-29 ENCOUNTER — Other Ambulatory Visit: Payer: Self-pay | Admitting: Nurse Practitioner

## 2019-04-29 MED ORDER — ONETOUCH DELICA LANCETS 30G MISC: 1.0000 | Freq: Every day | 1 refills | Status: DC

## 2019-05-16 ENCOUNTER — Other Ambulatory Visit: Payer: Self-pay | Admitting: Nurse Practitioner

## 2019-05-16 ENCOUNTER — Telehealth: Payer: Self-pay | Admitting: Nurse Practitioner

## 2019-05-16 DIAGNOSIS — F5101 Primary insomnia: Secondary | ICD-10-CM

## 2019-05-16 MED ORDER — ZOLPIDEM TARTRATE 10 MG PO TABS: 10.0000 mg | ORAL_TABLET | Freq: Every evening | ORAL | 2 refills | Status: DC | PRN

## 2019-05-16 NOTE — Telephone Encounter (Signed)
Refilled ambien and sent to [place]

## 2019-05-16 NOTE — Progress Notes (Signed)
Refilled ambien and sent to [place]

## 2019-06-13 ENCOUNTER — Other Ambulatory Visit: Payer: Self-pay

## 2019-06-13 MED ORDER — METOCLOPRAMIDE HCL 10 MG PO TABS: ORAL_TABLET | ORAL | 0 refills | Status: DC

## 2019-06-19 ENCOUNTER — Other Ambulatory Visit: Payer: Self-pay | Admitting: Nurse Practitioner

## 2019-06-19 DIAGNOSIS — N959 Unspecified menopausal and perimenopausal disorder: Secondary | ICD-10-CM

## 2019-06-30 ENCOUNTER — Other Ambulatory Visit: Payer: Self-pay | Admitting: Nurse Practitioner

## 2019-07-19 ENCOUNTER — Other Ambulatory Visit: Payer: Self-pay | Admitting: Nurse Practitioner

## 2019-07-19 DIAGNOSIS — M5000 Cervical disc disorder with myelopathy, unspecified cervical region: Secondary | ICD-10-CM

## 2019-07-19 MED ORDER — CYCLOBENZAPRINE HCL 5 MG PO TABS: 5.0000 mg | ORAL_TABLET | Freq: Every evening | ORAL | 0 refills | Status: DC | PRN

## 2019-07-28 ENCOUNTER — Encounter: Payer: Self-pay | Admitting: Nurse Practitioner

## 2019-07-28 ENCOUNTER — Ambulatory Visit (INDEPENDENT_AMBULATORY_CARE_PROVIDER_SITE_OTHER): Payer: No Typology Code available for payment source | Admitting: Nurse Practitioner

## 2019-07-28 ENCOUNTER — Other Ambulatory Visit: Payer: Self-pay

## 2019-07-28 VITALS — BP 150/88 | HR 98 | Temp 97.5°F | Resp 16 | Ht 68.0 in | Wt 229.0 lb

## 2019-07-28 DIAGNOSIS — Z0001 Encounter for general adult medical examination with abnormal findings: Secondary | ICD-10-CM

## 2019-07-28 DIAGNOSIS — Z124 Encounter for screening for malignant neoplasm of cervix: Secondary | ICD-10-CM

## 2019-07-28 DIAGNOSIS — I517 Cardiomegaly: Secondary | ICD-10-CM

## 2019-07-28 DIAGNOSIS — E1165 Type 2 diabetes mellitus with hyperglycemia: Secondary | ICD-10-CM | POA: Diagnosis not present

## 2019-07-28 DIAGNOSIS — I10 Essential (primary) hypertension: Secondary | ICD-10-CM | POA: Diagnosis not present

## 2019-07-28 DIAGNOSIS — R300 Dysuria: Secondary | ICD-10-CM

## 2019-07-28 DIAGNOSIS — F5101 Primary insomnia: Secondary | ICD-10-CM

## 2019-07-28 DIAGNOSIS — F411 Generalized anxiety disorder: Secondary | ICD-10-CM

## 2019-07-28 DIAGNOSIS — Z79899 Other long term (current) drug therapy: Secondary | ICD-10-CM | POA: Diagnosis not present

## 2019-07-28 LAB — POCT URINE DRUG SCREEN
POC Amphetamine UR: NOT DETECTED
POC BENZODIAZEPINES UR: POSITIVE — AB
POC Barbiturate UR: NOT DETECTED
POC Cocaine UR: NOT DETECTED
POC Ecstasy UR: NOT DETECTED
POC Marijuana UR: NOT DETECTED
POC Methadone UR: NOT DETECTED
POC Methamphetamine UR: NOT DETECTED
POC Opiate Ur: NOT DETECTED
POC Oxycodone UR: NOT DETECTED
POC PHENCYCLIDINE UR: NOT DETECTED
POC TRICYCLICS UR: POSITIVE — AB

## 2019-07-28 LAB — POCT GLYCOSYLATED HEMOGLOBIN (HGB A1C): Hemoglobin A1C: 7 % — AB (ref 4.0–5.6)

## 2019-07-28 MED ORDER — ALPRAZOLAM 0.5 MG PO TABS: ORAL_TABLET | ORAL | 3 refills | Status: DC

## 2019-07-28 MED ORDER — ZOLPIDEM TARTRATE 10 MG PO TABS: 10.0000 mg | ORAL_TABLET | Freq: Every evening | ORAL | 3 refills | Status: DC | PRN

## 2019-07-28 MED ORDER — LOSARTAN POTASSIUM 25 MG PO TABS: 25.0000 mg | ORAL_TABLET | Freq: Every day | ORAL | 3 refills | Status: DC

## 2019-07-28 NOTE — Progress Notes (Signed)
[facility] [place], [place] [address]  Internal MEDICINE  Office Visit Note  Patient Name: Valerie Ramos  [id number]  [id number]  Date of Service: 08/13/2019   Pt is here for routine health maintenance examination  Chief Complaint  Patient presents with  . Annual Exam  . Gynecologic Exam  . Diabetes  . Hypertension    BP has been up for the last several days, has had a bad headache., 168/90, 170/88, were some readings, daughter checked everyday  . Quality Metric Gaps    diabetic foot exam     The patient is here for health maintenance exam and pap smear. Blood pressure has been elevated recently. She currently takes HCTZ 25mg  daily. She has noted mild swelling in both ankles. She does have intermittent chest discomfort. This has been evaluated in the past, including cardiology consultation. No cardiac problems were identified.  Blood sugars have been doing well overall. Takes medication as prescribed. Blood sugars are often in the mid 100s. She is due to have routine, fasting labs drawn. She should have mammogram in early December.     Current Medication: Outpatient Encounter Medications as of 07/28/2019  Medication Sig  . acetaminophen (TYLENOL) 325 MG tablet Take 2 tablets (650 mg total) by mouth every 6 (six) hours as needed for mild pain (or Fever >/= 101).  Marland Kitchen ALPRAZolam (XANAX) 0.5 MG tablet TAKE 1 TABLET TWICE A DAY AS NEEDED FOR ANXIETY  . cyclobenzaprine (FLEXERIL) 5 MG tablet Take 1 tablet (5 mg total) by mouth at bedtime as needed for muscle spasms.  Marland Kitchen dicyclomine (BENTYL) 10 MG capsule Take 1 capsule (10 mg total) by mouth 3 (three) times daily before meals.  . DULoxetine (CYMBALTA) 30 MG capsule Take 30 mg by mouth daily.  Marland Kitchen estrogens, conjugated, (PREMARIN) 0.9 MG tablet 1 TABLET, COATED BY MOUTH ONCE A DAY  . glucose blood (ONE TOUCH ULTRA TEST) test strip FOR ONCE DAILY TESTING DX. E11.65  . hydrochlorothiazide (HYDRODIURIL) 25 MG  tablet TAKE 1 TABLET(S) BY MOUTH DAILY FOR BLOOD PRESSURE/EDEMA  . ibuprofen (ADVIL,MOTRIN) 800 MG tablet Take 1 tablet (800 mg total) by mouth 3 (three) times daily as needed.  . metFORMIN (GLUCOPHAGE) 500 MG tablet Take 0.5 tablets (250 mg total) by mouth 2 (two) times daily with a meal.  . metoCLOPramide (REGLAN) 10 MG tablet TAKE 1 TABLET THREE TIMES A DAY AS NEEDED FOR REFLUX  . OneTouch Delica Lancets [id number] MISC 1 each by Does not apply route daily. Use as directed once daily diag e11.65  . pantoprazole (PROTONIX) 40 MG tablet Take 1 tablet (40 mg total) by mouth daily.  . traMADol (ULTRAM) 50 MG tablet TAKE 1 TO 2 TABLETS BY MOUTH 3 TIMES A DAY AS NEEDED FOR PAIN  . zolpidem (AMBIEN) 10 MG tablet Take 1 tablet (10 mg total) by mouth at bedtime as needed for sleep.  . [DISCONTINUED] ALPRAZolam (XANAX) 0.5 MG tablet TAKE 1 TABLET TWICE A DAY AS NEEDED FOR ANXIETY  . [DISCONTINUED] zolpidem (AMBIEN) 10 MG tablet Take 1 tablet (10 mg total) by mouth at bedtime as needed for sleep.  . famotidine (PEPCID) 20 MG tablet Take 1 tablet (20 mg total) by mouth 2 (two) times daily.  Marland Kitchen losartan (COZAAR) 25 MG tablet Take 1 tablet (25 mg total) by mouth daily.   No facility-administered encounter medications on file as of 07/28/2019.     Surgical History: Past Surgical History:  Procedure Laterality Date  . ABDOMINAL  HYSTERECTOMY    . CESAREAN SECTION  1989  . CYSTECTOMY  2000   Ovarian  . OOPHORECTOMY    . OVARIAN CYST SURGERY  2006  . RECTOVAGINAL FISTULA CLOSURE  1980    Medical History: Past Medical History:  Diagnosis Date  . Anxiety   . Arthritis   . Depression   . Diabetes 1.5, managed as type 2 ([place])   . Hypertension   . Insomnia   . Migraines   . Reflux   . Sinusitis     Family History: Family History  Problem Relation Age of Onset  . Hodgkin's lymphoma Mother   . Diabetes Mother   . Thyroid disease Mother   . Glaucoma Mother   . Stroke Brother   . Heart disease  Brother       Review of Systems  Constitutional: Positive for fatigue. Negative for activity change, chills and unexpected weight change.  HENT: Negative for congestion, postnasal drip, rhinorrhea, sneezing and sore throat.   Respiratory: Negative for cough, chest tightness, shortness of breath and wheezing.   Cardiovascular: Positive for palpitations and leg swelling. Negative for chest pain.       Elevated blood pressure with mild swelling in both ankles.  Gastrointestinal: Negative for abdominal pain, constipation, diarrhea, nausea and vomiting.  Endocrine: Negative for cold intolerance, heat intolerance, polydipsia and polyuria.       Blood sugars doing well   Genitourinary: Negative for dysuria, frequency and urgency.  Musculoskeletal: Negative for arthralgias, back pain, joint swelling and neck pain.  Skin: Negative for rash.  Neurological: Negative for dizziness, tremors, numbness and headaches.  Hematological: Negative for adenopathy. Does not bruise/bleed easily.  Psychiatric/Behavioral: Negative for behavioral problems (Depression), sleep disturbance and suicidal ideas. The patient is nervous/anxious.      Today's Vitals   07/28/19 0958 07/28/19 1028  BP: (!) 178/91 (!) 150/88  Pulse: 97 98  Resp: 16   Temp: (!) 97.5 F (36.4 C)   SpO2: 98% 98%  Weight: 229 lb (103.9 kg)   Height: 5\' 8"  (1.727 m)    Body mass index is 34.82 kg/m.  Physical Exam Vitals signs and nursing note reviewed.  Constitutional:      General: She is not in acute distress.    Appearance: Normal appearance. She is well-developed. She is obese. She is not diaphoretic.  HENT:     Head: Normocephalic and atraumatic.     Nose: Nose normal.     Mouth/Throat:     Pharynx: No oropharyngeal exudate.  Eyes:     Extraocular Movements: Extraocular movements intact.     Pupils: Pupils are equal, round, and reactive to light.  Neck:     Musculoskeletal: Normal range of motion and neck supple.      Thyroid: No thyromegaly.     Vascular: No carotid bruit or JVD.     Trachea: No tracheal deviation.  Cardiovascular:     Rate and Rhythm: Normal rate and regular rhythm.     Pulses:          Dorsalis pedis pulses are 1+ on the right side and 1+ on the left side.       Posterior tibial pulses are 1+ on the right side and 1+ on the left side.     Heart sounds: Normal heart sounds. No murmur. No friction rub. No gallop.   Pulmonary:     Effort: Pulmonary effort is normal. No respiratory distress.     Breath sounds: Normal breath  sounds. No wheezing or rales.  Chest:     Chest wall: No tenderness.     Breasts:        Right: Normal. No swelling, bleeding, inverted nipple, mass, nipple discharge, skin change or tenderness.        Left: Normal. No swelling, bleeding, inverted nipple, mass, nipple discharge, skin change or tenderness.  Abdominal:     General: Bowel sounds are normal.     Palpations: Abdomen is soft.     Tenderness: There is no abdominal tenderness.  Genitourinary:    General: Normal vulva.     Exam position: Supine.     Labia:        Right: No tenderness or lesion.        Left: No tenderness or lesion.      Vagina: Normal. No vaginal discharge, erythema or tenderness.     Cervix: No cervical motion tenderness, discharge, friability or erythema.     Uterus: Normal.      Adnexa: Right adnexa normal and left adnexa normal.     Comments: No tenderness, masses, or organomeglay present during bimanual exam . Musculoskeletal: Normal range of motion.     Right foot: Normal range of motion. No deformity.     Left foot: Normal range of motion. No deformity.  Feet:     Right foot:     Protective Sensation: 10 sites tested. 10 sites sensed.     Skin integrity: Skin integrity normal.     Toenail Condition: Right toenails are normal.     Left foot:     Protective Sensation: 10 sites tested. 10 sites sensed.     Skin integrity: Skin integrity normal.     Toenail Condition: Left  toenails are normal.  Lymphadenopathy:     Cervical: No cervical adenopathy.     Lower Body: No right inguinal adenopathy. No left inguinal adenopathy.  Skin:    General: Skin is warm and dry.  Neurological:     Mental Status: She is alert and oriented to person, place, and time.     Cranial Nerves: No cranial nerve deficit.  Psychiatric:        Attention and Perception: Attention and perception normal.        Mood and Affect: Affect normal. Mood is anxious.        Speech: Speech normal.        Behavior: Behavior normal. Behavior is cooperative.        Thought Content: Thought content normal.        Cognition and Memory: Cognition and memory normal.        Judgment: Judgment normal.    LABS: Recent Results (from the past 2160 hour(s))  UA/M w/rflx Culture, Routine     Status: Abnormal   Collection Time: 07/28/19 12:00 AM   Specimen: Urine   URINE  Result Value Ref Range   Specific Gravity, UA 1.023 1.005 - 1.030   pH, UA 5.0 5.0 - 7.5   Color, UA Yellow Yellow   Appearance Ur Clear Clear   Leukocytes,UA Negative Negative   Protein,UA 3+ (A) Negative/Trace   Glucose, UA Negative Negative   Ketones, UA Trace (A) Negative   RBC, UA Negative Negative   Bilirubin, UA Negative Negative   Urobilinogen, Ur 0.2 0.2 - 1.0 mg/dL   Nitrite, UA Negative Negative   Microscopic Examination See below:     Comment: Microscopic was indicated and was performed.   Urinalysis Reflex Comment  Comment: This specimen will not reflex to a Urine Culture.  Pap IG and HPV (high risk) DNA detection     Status: None   Collection Time: 07/28/19 12:00 AM  Result Value Ref Range   Interpretation NILM     Comment: NEGATIVE FOR INTRAEPITHELIAL LESION OR MALIGNANCY.   Category NIL     Comment: Negative for Intraepithelial Lesion   Adequacy ENDO     Comment: Satisfactory for evaluation. Endocervical and/or squamous metaplastic cells (endocervical component) are present.    Clinician Provided ICD10  Comment     Comment: Z12.4   Performed by: Comment     Comment: Mills Koller, Cytotechnologist (ASCP)   Note: Comment     Comment: The Pap smear is a screening test designed to aid in the detection of premalignant and malignant conditions of the uterine cervix.  It is not a diagnostic procedure and should not be used as the sole means of detecting cervical cancer.  Both false-positive and false-negative reports do occur.    Test Methodology Comment     Comment: This liquid based ThinPrep(R) pap test was screened with the use of an image guided system.    HPV, high-risk Negative Negative    Comment: This nucleic acid amplification high-risk HPV test detects thirteen high-risk types (16,18,31,33,35,39,45,51,52,56,58,59,68) without differentiation.   Microscopic Examination     Status: Abnormal   Collection Time: 07/28/19 12:00 AM   URINE  Result Value Ref Range   WBC, UA 0-5 0 - 5 /hpf   RBC 0-2 0 - 2 /hpf   Epithelial Cells (non renal) 0-10 0 - 10 /hpf   Casts Present (A) None seen /lpf   Cast Type Hyaline casts N/A   Mucus, UA Present Not Estab.   Bacteria, UA Few None seen/Few  POCT Urine Drug Screen     Status: Abnormal   Collection Time: 07/28/19 10:23 AM  Result Value Ref Range   POC METHAMPHETAMINE UR None Detected None Detected   POC Opiate Ur None Detected None Detected   POC Barbiturate UR None Detected None Detected   POC Amphetamine UR None Detected None Detected   POC Oxycodone UR None Detected None Detected   POC Cocaine UR None Detected None Detected   POC Ecstasy UR None Detected None Detected   POC TRICYCLICS UR Positive (A) None Detected   POC PHENCYCLIDINE UR None Detected None Detected   POC MARIJUANA UR None Detected None Detected   POC METHADONE UR None Detected None Detected   POC BENZODIAZEPINES UR Positive (A) None Detected   URINE TEMPERATURE     POC DRUG SCREEN OXIDANTS URINE     POC SPECIFIC GRAVITY URINE     POC PH URINE      Methylenedioxyamphetamine    POCT HgB A1C     Status: Abnormal   Collection Time: 07/28/19 10:25 AM  Result Value Ref Range   Hemoglobin A1C 7.0 (A) 4.0 - 5.6 %   HbA1c POC (<> result, manual entry)     HbA1c, POC (prediabetic range)     HbA1c, POC (controlled diabetic range)    CBC     Status: None   Collection Time: 08/09/19  3:45 PM  Result Value Ref Range   WBC 9.5 4.0 - 10.5 K/uL   RBC 4.43 3.87 - 5.11 MIL/uL   Hemoglobin 12.5 12.0 - 15.0 g/dL   HCT 37.0 36.0 - 46.0 %   MCV 83.5 80.0 - 100.0 fL   MCH 28.2 26.0 -  34.0 pg   MCHC 33.8 30.0 - 36.0 g/dL   RDW 14.0 11.5 - 15.5 %   Platelets 307 150 - 400 K/uL   nRBC 0.0 0.0 - 0.2 %    Comment: Performed at [facility], [address] [place], [place] [address]  Comprehensive metabolic panel     Status: None   Collection Time: 08/09/19  3:45 PM  Result Value Ref Range   Sodium 138 135 - 145 mmol/L   Potassium 3.8 3.5 - 5.1 mmol/L   Chloride 100 98 - 111 mmol/L   CO2 26 22 - 32 mmol/L   Glucose, Bld 99 70 - 99 mg/dL   BUN 10 6 - 20 mg/dL   Creatinine, Ser 0.62 0.44 - 1.00 mg/dL   Calcium 9.2 8.9 - 10.3 mg/dL   Total Protein 7.8 6.5 - 8.1 g/dL   Albumin 3.7 3.5 - 5.0 g/dL   AST 30 15 - 41 U/L   ALT 20 0 - 44 U/L   Alkaline Phosphatase 70 38 - 126 U/L   Total Bilirubin 0.5 0.3 - 1.2 mg/dL   GFR calc non Af Amer >60 >60 mL/min   GFR calc Af Amer >60 >60 mL/min   Anion gap 12 5 - 15    Comment: Performed at [facility], [address] [place], [place] [address]  Lipid panel     Status: Abnormal   Collection Time: 08/09/19  3:45 PM  Result Value Ref Range   Cholesterol 171 0 - 200 mg/dL   Triglycerides 165 (H) <150 mg/dL   HDL 66 >40 mg/dL   Total CHOL/HDL Ratio 2.6 RATIO   VLDL 33 0 - 40 mg/dL   LDL Cholesterol 72 0 - 99 mg/dL    Comment:        Total Cholesterol/HDL:CHD Risk Coronary Heart Disease Risk Table                     Men   Women  1/2 Average Risk   3.4   3.3  Average Risk       5.0    4.4  2 X Average Risk   9.6   7.1  3 X Average Risk  23.4   11.0        Use the calculated Patient Ratio above and the CHD Risk Table to determine the patient's CHD Risk.        ATP III CLASSIFICATION (LDL):  <100     mg/dL   Optimal  100-129  mg/dL   Near or Above                    Optimal  130-159  mg/dL   Borderline  160-189  mg/dL   High  >190     mg/dL   Very High Performed at [facility], [address] [place], [place] [address]   Folate     Status: None   Collection Time: 08/09/19  3:45 PM  Result Value Ref Range   Folate 23.0 >5.9 ng/mL    Comment: Performed at [facility], [address] [place], [place] [address]  VITAMIN D 25 Hydroxy (Vit-D Deficiency, Fractures)     Status: Abnormal   Collection Time: 08/09/19  3:45 PM  Result Value Ref Range   Vit D, 25-Hydroxy 25.73 (L) 30 - 100 ng/mL    Comment: (NOTE) Vitamin D deficiency has been defined by the Institute of Medicine  and an  Endocrine Society practice guideline as a level of serum 25-OH  vitamin D less than 20 ng/mL (1,2). The Endocrine Society went on to  further define vitamin D insufficiency as a level between 21 and 29  ng/mL (2). 1. IOM (Institute of Medicine). 2010. Dietary reference intakes for  calcium and D. [place]: The [employer]. 2. Holick MF, Binkley [place], Bischoff-Ferrari HA, et al. Evaluation,  treatment, and prevention of vitamin D deficiency: an Endocrine  Society clinical practice guideline, JCEM. 2011 Jul; 96(7): 1911-30. Performed at [facility], [place] [address] [place], [place] [address]   Vitamin B12     Status: None   Collection Time: 08/09/19  3:45 PM  Result Value Ref Range   Vitamin B-12 521 180 - 914 pg/mL    Comment: (NOTE) This assay is not validated for testing neonatal or myeloproliferative syndrome specimens for Vitamin B12 levels. Performed at [facility], [place] [address] [place], [place] [address]   TSH      Status: None   Collection Time: 08/09/19  3:45 PM  Result Value Ref Range   TSH 2.794 0.350 - 4.500 uIU/mL    Comment: Performed by a 3rd Generation assay with a functional sensitivity of <=0.01 uIU/mL. Performed at [facility], [address] [place], [place] [address]   T4, free     Status: None   Collection Time: 08/09/19  3:45 PM  Result Value Ref Range   Free T4 0.73 0.61 - 1.12 ng/dL    Comment: (NOTE) Biotin ingestion may interfere with free T4 tests. If the results are inconsistent with the TSH level, previous test results, or the clinical presentation, then consider biotin interference. If needed, order repeat testing after stopping biotin. Performed at [facility], [address], [place], [place] [address]     Assessment/Plan: 1. Encounter for general adult medical examination with abnormal findings Annual health maintenance exam with pap smear today.   2. Type 2 diabetes mellitus with hyperglycemia, without long-term current use of insulin (HCC) - POCT HgB A1C 7.0 today. Continue diabetic medication as prescribed  - Microalbumin, urine  3. Essential (primary) hypertension Add losartan 25mg  daily. Continue HCTZ as prescribed. Will get echocardiogram for further evaluation.  - losartan (COZAAR) 25 MG tablet; Take 1 tablet (25 mg total) by mouth daily.  Dispense: 30 tablet; Refill: 3 - ECHOCARDIOGRAM COMPLETE; Future  4. Cardiomegaly Will get echocardiogram for further evaluation.  - ECHOCARDIOGRAM COMPLETE; Future  5. Generalized anxiety disorder May take alprazolam 0.5mg  up to twice daily as needed for acute anxiety. New prescription sent to her pharmacy.  - ALPRAZolam (XANAX) 0.5 MG tablet; TAKE 1 TABLET TWICE A DAY AS NEEDED FOR ANXIETY  Dispense: 60 tablet; Refill: 3  6. Primary insomnia May take zolpidem 10mg  at bedtime as needed. New prescription sent to her pharmacy.  - zolpidem (AMBIEN) 10 MG tablet; Take 1 tablet (10 mg total) by  mouth at bedtime as needed for sleep.  Dispense: 30 tablet; Refill: 3  7. Routine cervical smear - Pap IG and HPV (high risk) DNA detection  8. Encounter for long-term (current) use of medications - POCT Urine Drug Screen positive for TCA and BZO  9. Dysuria - UA/M w/rflx Culture, Routine  General Counseling: Sandy Salaam understanding of the findings of todays visit and agrees with plan of treatment. I have discussed any further diagnostic evaluation that may be needed or ordered today. We also reviewed her medications today. she  has been encouraged to call the office with any questions or concerns that should arise related to todays visit.    Counseling:  Hypertension Counseling:   The following hypertensive lifestyle modification were recommended and discussed:  1. Limiting alcohol intake to less than 1 oz/day of ethanol:(24 oz of beer or 8 oz of wine or 2 oz of 100-proof whiskey). 2. Take baby ASA 81 mg daily. 3. Importance of regular aerobic exercise and losing weight. 4. Reduce dietary saturated fat and cholesterol intake for overall cardiovascular health. 5. Maintaining adequate dietary potassium, calcium, and magnesium intake. 6. Regular monitoring of the blood pressure. 7. Reduce sodium intake to less than 100 mmol/day (less than 2.3 gm of sodium or less than 6 gm of sodium choride)   This patient was seen by [place] with Dr Lavera Guise as a part of collaborative care agreement  Orders Placed This Encounter  Procedures  . Microscopic Examination  . UA/M w/rflx Culture, Routine  . Microalbumin, urine  . POCT HgB A1C  . POCT Urine Drug Screen  . ECHOCARDIOGRAM COMPLETE    Meds ordered this encounter  Medications  . losartan (COZAAR) 25 MG tablet    Sig: Take 1 tablet (25 mg total) by mouth daily.    Dispense:  30 tablet    Refill:  3    Order Specific Question:   Supervising Provider    Answer:   Lavera Guise [id number]  . ALPRAZolam  (XANAX) 0.5 MG tablet    Sig: TAKE 1 TABLET TWICE A DAY AS NEEDED FOR ANXIETY    Dispense:  60 tablet    Refill:  3    Order Specific Question:   Supervising Provider    Answer:   Lavera Guise [place]  . zolpidem (AMBIEN) 10 MG tablet    Sig: Take 1 tablet (10 mg total) by mouth at bedtime as needed for sleep.    Dispense:  30 tablet    Refill:  3    Order Specific Question:   Supervising Provider    Answer:   Lavera Guise [id number]    Time spent: [place], MD  Internal Medicine

## 2019-07-29 LAB — MICROSCOPIC EXAMINATION

## 2019-07-29 LAB — UA/M W/RFLX CULTURE, ROUTINE
Bilirubin, UA: NEGATIVE
Glucose, UA: NEGATIVE
Leukocytes,UA: NEGATIVE
Nitrite, UA: NEGATIVE
RBC, UA: NEGATIVE
Specific Gravity, UA: 1.023 (ref 1.005–1.030)
Urobilinogen, Ur: 0.2 mg/dL (ref 0.2–1.0)
pH, UA: 5 (ref 5.0–7.5)

## 2019-08-04 LAB — PAP IG AND HPV HIGH-RISK: HPV, high-risk: NEGATIVE

## 2019-08-04 NOTE — Progress Notes (Signed)
Please let the patient know that pap semar was normal. Thanks.

## 2019-08-05 ENCOUNTER — Telehealth: Payer: Self-pay

## 2019-08-05 NOTE — Telephone Encounter (Signed)
-----   Message from Ronnell Freshwater, NP sent at 08/04/2019  5:13 PM EST ----- Please let the patient know that pap semar was normal. Thanks.

## 2019-08-05 NOTE — Telephone Encounter (Signed)
Pt advised pap smear is normal  

## 2019-08-09 ENCOUNTER — Other Ambulatory Visit
Admission: RE | Admit: 2019-08-09 | Discharge: 2019-08-09 | Disposition: A | Discharge status: Home / Self Care | Payer: 59 | Source: Ambulatory Visit | Attending: Nurse Practitioner | Admitting: Nurse Practitioner

## 2019-08-09 DIAGNOSIS — E559 Vitamin D deficiency, unspecified: Secondary | ICD-10-CM | POA: Diagnosis not present

## 2019-08-09 DIAGNOSIS — Z0000 Encounter for general adult medical examination without abnormal findings: Secondary | ICD-10-CM | POA: Insufficient documentation

## 2019-08-09 DIAGNOSIS — E1142 Type 2 diabetes mellitus with diabetic polyneuropathy: Secondary | ICD-10-CM | POA: Insufficient documentation

## 2019-08-09 DIAGNOSIS — I10 Essential (primary) hypertension: Secondary | ICD-10-CM | POA: Diagnosis not present

## 2019-08-09 LAB — COMPREHENSIVE METABOLIC PANEL
ALT: 20 U/L (ref 0–44)
AST: 30 U/L (ref 15–41)
Albumin: 3.7 g/dL (ref 3.5–5.0)
Alkaline Phosphatase: 70 U/L (ref 38–126)
Anion gap: 12 (ref 5–15)
BUN: 10 mg/dL (ref 6–20)
CO2: 26 mmol/L (ref 22–32)
Calcium: 9.2 mg/dL (ref 8.9–10.3)
Chloride: 100 mmol/L (ref 98–111)
Creatinine, Ser: 0.62 mg/dL (ref 0.44–1.00)
GFR calc Af Amer: >60 mL/min (ref >60)
GFR calc non Af Amer: >60 mL/min (ref >60)
Glucose, Bld: 99 mg/dL (ref 70–99)
Potassium: 3.8 mmol/L (ref 3.5–5.1)
Sodium: 138 mmol/L (ref 135–145)
Total Bilirubin: 0.5 mg/dL (ref 0.3–1.2)
Total Protein: 7.8 g/dL (ref 6.5–8.1)

## 2019-08-09 LAB — T4, FREE: Free T4: 0.73 ng/dL (ref 0.61–1.12)

## 2019-08-09 LAB — CBC
HCT: 37 % (ref 36.0–46.0)
Hemoglobin: 12.5 g/dL (ref 12.0–15.0)
MCH: 28.2 pg (ref 26.0–34.0)
MCHC: 33.8 g/dL (ref 30.0–36.0)
MCV: 83.5 fL (ref 80.0–100.0)
Platelets: 307 10*3/uL (ref 150–400)
RBC: 4.43 MIL/uL (ref 3.87–5.11)
RDW: 14 % (ref 11.5–15.5)
WBC: 9.5 10*3/uL (ref 4.0–10.5)
nRBC: 0 % (ref 0.0–0.2)

## 2019-08-09 LAB — LIPID PANEL
Cholesterol: 171 mg/dL (ref 0–200)
HDL: 66 mg/dL (ref >40)
LDL Cholesterol: 72 mg/dL (ref 0–99)
Total CHOL/HDL Ratio: 2.6 RATIO
Triglycerides: 165 mg/dL — ABNORMAL HIGH (ref <150)
VLDL: 33 mg/dL (ref 0–40)

## 2019-08-09 LAB — TSH: TSH: 2.794 u[IU]/mL (ref 0.350–4.500)

## 2019-08-09 LAB — FOLATE: Folate: 23 ng/mL (ref >5.9)

## 2019-08-09 LAB — VITAMIN B12: Vitamin B-12: 521 pg/mL (ref 180–914)

## 2019-08-09 LAB — VITAMIN D 25 HYDROXY (VIT D DEFICIENCY, FRACTURES): Vit D, 25-Hydroxy: 25.73 ng/mL — ABNORMAL LOW (ref 30–100)

## 2019-08-11 NOTE — Progress Notes (Signed)
Discuss labs with patient at visit 08/22/2019

## 2019-08-13 DIAGNOSIS — Z124 Encounter for screening for malignant neoplasm of cervix: Secondary | ICD-10-CM | POA: Insufficient documentation

## 2019-08-13 DIAGNOSIS — I517 Cardiomegaly: Secondary | ICD-10-CM | POA: Insufficient documentation

## 2019-08-13 DIAGNOSIS — Z0001 Encounter for general adult medical examination with abnormal findings: Secondary | ICD-10-CM | POA: Insufficient documentation

## 2019-08-13 DIAGNOSIS — Z79899 Other long term (current) drug therapy: Secondary | ICD-10-CM | POA: Insufficient documentation

## 2019-08-17 ENCOUNTER — Telehealth: Payer: Self-pay

## 2019-08-17 NOTE — Telephone Encounter (Signed)
Confirmed pt ultrasound appt 08/19/19. Beth °

## 2019-08-18 ENCOUNTER — Telehealth: Payer: Self-pay

## 2019-08-18 NOTE — Telephone Encounter (Signed)
SCREENED FOR COVID AND CONFIRMED 08-22-19 OV.

## 2019-08-19 ENCOUNTER — Other Ambulatory Visit: Payer: Self-pay

## 2019-08-19 ENCOUNTER — Ambulatory Visit: Payer: No Typology Code available for payment source

## 2019-08-19 DIAGNOSIS — M5000 Cervical disc disorder with myelopathy, unspecified cervical region: Secondary | ICD-10-CM

## 2019-08-19 DIAGNOSIS — I517 Cardiomegaly: Secondary | ICD-10-CM

## 2019-08-19 DIAGNOSIS — I10 Essential (primary) hypertension: Secondary | ICD-10-CM

## 2019-08-19 MED ORDER — CYCLOBENZAPRINE HCL 5 MG PO TABS: 5.0000 mg | ORAL_TABLET | Freq: Every evening | ORAL | 2 refills | Status: DC | PRN

## 2019-08-22 ENCOUNTER — Other Ambulatory Visit: Payer: Self-pay

## 2019-08-22 ENCOUNTER — Ambulatory Visit: Payer: No Typology Code available for payment source | Admitting: Nurse Practitioner

## 2019-08-22 ENCOUNTER — Encounter: Payer: Self-pay | Admitting: Nurse Practitioner

## 2019-08-22 VITALS — BP 140/88 | HR 75 | Temp 96.5°F | Resp 16 | Ht 68.0 in | Wt 231.2 lb

## 2019-08-22 DIAGNOSIS — R300 Dysuria: Secondary | ICD-10-CM

## 2019-08-22 DIAGNOSIS — I5189 Other ill-defined heart diseases: Secondary | ICD-10-CM | POA: Diagnosis not present

## 2019-08-22 DIAGNOSIS — E1142 Type 2 diabetes mellitus with diabetic polyneuropathy: Secondary | ICD-10-CM | POA: Diagnosis not present

## 2019-08-22 DIAGNOSIS — E1165 Type 2 diabetes mellitus with hyperglycemia: Secondary | ICD-10-CM

## 2019-08-22 DIAGNOSIS — N390 Urinary tract infection, site not specified: Secondary | ICD-10-CM

## 2019-08-22 DIAGNOSIS — R319 Hematuria, unspecified: Secondary | ICD-10-CM

## 2019-08-22 LAB — POCT URINALYSIS DIPSTICK
Bilirubin, UA: NEGATIVE
Glucose, UA: NEGATIVE
Ketones, UA: NEGATIVE
Leukocytes, UA: NEGATIVE
Nitrite, UA: NEGATIVE
Protein, UA: NEGATIVE
Spec Grav, UA: 1.025 (ref 1.010–1.025)
Urobilinogen, UA: 0.2 E.U./dL
pH, UA: 5 (ref 5.0–8.0)

## 2019-08-22 MED ORDER — ROSUVASTATIN CALCIUM 5 MG PO TABS: 5.0000 mg | ORAL_TABLET | Freq: Every day | ORAL | 3 refills | Status: DC

## 2019-08-22 MED ORDER — NITROFURANTOIN MONOHYD MACRO 100 MG PO CAPS: 100.0000 mg | ORAL_CAPSULE | Freq: Two times a day (BID) | ORAL | 0 refills | Status: DC

## 2019-08-22 MED ORDER — PHENAZOPYRIDINE HCL 200 MG PO TABS: 200.0000 mg | ORAL_TABLET | Freq: Three times a day (TID) | ORAL | 0 refills | Status: DC | PRN

## 2019-08-22 MED ORDER — GABAPENTIN 100 MG PO CAPS: ORAL_CAPSULE | ORAL | 3 refills | Status: DC

## 2019-08-22 NOTE — Progress Notes (Signed)
[facility] [place], [place] [address]  Internal MEDICINE  Office Visit Note  Patient Name: Valerie Ramos  [id number]  [id number]  Date of Service: 09/04/2019  Chief Complaint  Patient presents with  . Hypertension    ADDED LOSARTAN   . Dysuria    HAS URGENCY TO URINATE BUT CAN ONLY URINATE A SMALL AMOUNT     Patient is here for follow up visit.  1.Echocardiogram - normal LVEF. Diastolic dysfunction 2. Neuropathy in both lower legs - cold and burning sensation, bilaterally.  3. Anxiety/depression - generally well managed.  4. Labs - look good.  5.urinary frequency with painful urination. Started yesterday. Has become worse. U/a showing trace blood only.      Current Medication: Outpatient Encounter Medications as of 08/22/2019  Medication Sig  . acetaminophen (TYLENOL) 325 MG tablet Take 2 tablets (650 mg total) by mouth every 6 (six) hours as needed for mild pain (or Fever >/= 101).  Marland Kitchen ALPRAZolam (XANAX) 0.5 MG tablet TAKE 1 TABLET TWICE A DAY AS NEEDED FOR ANXIETY  . cyclobenzaprine (FLEXERIL) 5 MG tablet Take 1 tablet (5 mg total) by mouth at bedtime as needed for muscle spasms.  Marland Kitchen dicyclomine (BENTYL) 10 MG capsule Take 1 capsule (10 mg total) by mouth 3 (three) times daily before meals.  . DULoxetine (CYMBALTA) 30 MG capsule Take 30 mg by mouth daily.  Marland Kitchen estrogens, conjugated, (PREMARIN) 0.9 MG tablet 1 TABLET, COATED BY MOUTH ONCE A DAY  . glucose blood (ONE TOUCH ULTRA TEST) test strip FOR ONCE DAILY TESTING DX. E11.65  . hydrochlorothiazide (HYDRODIURIL) 25 MG tablet TAKE 1 TABLET(S) BY MOUTH DAILY FOR BLOOD PRESSURE/EDEMA  . ibuprofen (ADVIL,MOTRIN) 800 MG tablet Take 1 tablet (800 mg total) by mouth 3 (three) times daily as needed.  Marland Kitchen losartan (COZAAR) 25 MG tablet Take 1 tablet (25 mg total) by mouth daily.  . metFORMIN (GLUCOPHAGE) 500 MG tablet Take 0.5 tablets (250 mg total) by mouth 2 (two) times daily with a meal.  . metoCLOPramide  (REGLAN) 10 MG tablet TAKE 1 TABLET THREE TIMES A DAY AS NEEDED FOR REFLUX  . OneTouch Delica Lancets 99991111 MISC 1 each by Does not apply route daily. Use as directed once daily diag e11.65  . pantoprazole (PROTONIX) 40 MG tablet Take 1 tablet (40 mg total) by mouth daily.  . traMADol (ULTRAM) 50 MG tablet TAKE 1 TO 2 TABLETS BY MOUTH 3 TIMES A DAY AS NEEDED FOR PAIN  . zolpidem (AMBIEN) 10 MG tablet Take 1 tablet (10 mg total) by mouth at bedtime as needed for sleep.  . famotidine (PEPCID) 20 MG tablet Take 1 tablet (20 mg total) by mouth 2 (two) times daily.  Marland Kitchen gabapentin (NEURONTIN) 100 MG capsule Take 1 capsule po every evening. May increase to twice daily as needed and as tolerated .  Marland Kitchen nitrofurantoin, macrocrystal-monohydrate, (MACROBID) 100 MG capsule Take 1 capsule (100 mg total) by mouth 2 (two) times daily.  . phenazopyridine (PYRIDIUM) 200 MG tablet Take 1 tablet (200 mg total) by mouth 3 (three) times daily as needed for pain.  . rosuvastatin (CRESTOR) 5 MG tablet Take 1 tablet (5 mg total) by mouth daily.   No facility-administered encounter medications on file as of 08/22/2019.     Surgical History: Past Surgical History:  Procedure Laterality Date  . ABDOMINAL HYSTERECTOMY    . CESAREAN SECTION  1989  . CYSTECTOMY  2000   Ovarian  . OOPHORECTOMY    .  OVARIAN CYST SURGERY  2006  . RECTOVAGINAL FISTULA CLOSURE  1980    Medical History: Past Medical History:  Diagnosis Date  . Anxiety   . Arthritis   . Depression   . Diabetes 1.5, managed as type 2 ([place])   . Hypertension   . Insomnia   . Migraines   . Reflux   . Sinusitis     Family History: Family History  Problem Relation Age of Onset  . Hodgkin's lymphoma Mother   . Diabetes Mother   . Thyroid disease Mother   . Glaucoma Mother   . Stroke Brother   . Heart disease Brother     Social History   Socioeconomic History  . Marital status: Married    Spouse name: Not on file  . Number of children: Not on  file  . Years of education: Not on file  . Highest education level: Not on file  Occupational History  . Not on file  Social Needs  . Financial resource strain: Not on file  . Food insecurity    Worry: Not on file    Inability: Not on file  . Transportation needs    Medical: Not on file    Non-medical: Not on file  Tobacco Use  . Smoking status: Former Smoker    Quit date: 05/24/2011    Years since quitting: 8.2  . Smokeless tobacco: Never Used  Substance and Sexual Activity  . Alcohol use: No  . Drug use: No  . Sexual activity: Not on file  Lifestyle  . Physical activity    Days per week: Not on file    Minutes per session: Not on file  . Stress: Not on file  Relationships  . Social Herbalist on phone: Not on file    Gets together: Not on file    Attends religious service: Not on file    Active member of club or organization: Not on file    Attends meetings of clubs or organizations: Not on file    Relationship status: Not on file  . Intimate partner violence    Fear of current or ex partner: Not on file    Emotionally abused: Not on file    Physically abused: Not on file    Forced sexual activity: Not on file  Other Topics Concern  . Not on file  Social History Narrative  . Not on file      Review of Systems  Constitutional: Positive for fatigue. Negative for activity change, chills and unexpected weight change.  HENT: Negative for congestion, postnasal drip, rhinorrhea, sneezing and sore throat.   Respiratory: Negative for cough, chest tightness, shortness of breath and wheezing.   Cardiovascular: Negative for chest pain and palpitations.  Gastrointestinal: Negative for abdominal pain, constipation, diarrhea, nausea and vomiting.  Endocrine: Negative for cold intolerance, heat intolerance, polydipsia and polyuria.  Genitourinary: Positive for dysuria and frequency.  Musculoskeletal: Negative for arthralgias, back pain, joint swelling and neck pain.   Skin: Negative for rash.  Allergic/Immunologic: Negative for environmental allergies.  Neurological: Positive for numbness. Negative for tremors.       Burning sensation in both feet.   Hematological: Negative for adenopathy. Does not bruise/bleed easily.  Psychiatric/Behavioral: Negative for behavioral problems (Depression), sleep disturbance and suicidal ideas. The patient is nervous/anxious.     Today's Vitals   08/22/19 1557  BP: 140/88  Pulse: 75  Resp: 16  Temp: (!) 96.5 F (35.8 C)  SpO2: 98%  Weight: 231 lb 3.2 oz (104.9 kg)  Height: 5\' 8"  (1.727 m)   Body mass index is 35.15 kg/m.  Physical Exam Vitals signs and nursing note reviewed.  Constitutional:      General: She is not in acute distress.    Appearance: Normal appearance. She is well-developed. She is not diaphoretic.  HENT:     Head: Normocephalic and atraumatic.     Nose: Nose normal.     Mouth/Throat:     Pharynx: No oropharyngeal exudate.  Eyes:     Extraocular Movements: Extraocular movements intact.     Pupils: Pupils are equal, round, and reactive to light.  Neck:     Musculoskeletal: Normal range of motion and neck supple.     Thyroid: No thyromegaly.     Vascular: No JVD.     Trachea: No tracheal deviation.  Cardiovascular:     Rate and Rhythm: Normal rate and regular rhythm.     Heart sounds: Normal heart sounds. No murmur. No friction rub. No gallop.   Pulmonary:     Effort: Pulmonary effort is normal. No respiratory distress.     Breath sounds: Normal breath sounds. No wheezing or rales.  Chest:     Chest wall: No tenderness.  Abdominal:     General: Bowel sounds are normal.     Palpations: Abdomen is soft.     Tenderness: There is no abdominal tenderness.  Genitourinary:    Comments: Urine sample negative for evidence of infection or other abnormalities.  Musculoskeletal: Normal range of motion.  Lymphadenopathy:     Cervical: No cervical adenopathy.  Skin:    General: Skin is  warm and dry.  Neurological:     Mental Status: She is alert and oriented to person, place, and time.     Cranial Nerves: No cranial nerve deficit.  Psychiatric:        Behavior: Behavior normal.        Thought Content: Thought content normal.        Judgment: Judgment normal.    Assessment/Plan: 1. Urinary tract infection with hematuria, site unspecified Will do round macrobid 100mg  twice daily for 5 days.  - nitrofurantoin, macrocrystal-monohydrate, (MACROBID) 100 MG capsule; Take 1 capsule (100 mg total) by mouth 2 (two) times daily.  Dispense: 10 capsule; Refill: 0  2. Diastolic dysfunction Present on echocardiogram. Will get home sleep study for further evaluation.  - Home sleep test  3. Diabetic polyneuropathy associated with type 2 diabetes mellitus (HCC) Start gabapentin 10.0mg  in the evening. May increase to twice daily as needed and as tolerated.  - gabapentin (NEURONTIN) 100 MG capsule; Take 1 capsule po every evening. May increase to twice daily as needed and as tolerated .  Dispense: 60 capsule; Refill: 3  4. Dysuria Will give pyridium 200mg  up to three times daily if needed for bladder pain/spasms.  - POCT Urinalysis Dipstick - phenazopyridine (PYRIDIUM) 200 MG tablet; Take 1 tablet (200 mg total) by mouth 3 (three) times daily as needed for pain.  Dispense: 10 tablet; Refill: 0  5. Type 2 diabetes mellitus with hyperglycemia, without long-term current use of insulin ([place]) Start crestor 5mg  every evening for preventative measures.  - rosuvastatin (CRESTOR) 5 MG tablet; Take 1 tablet (5 mg total) by mouth daily.  Dispense: 30 tablet; Refill: 3  General Counseling: Kajah verbalizes understanding of the findings of todays visit and agrees with plan of treatment. I have discussed any further diagnostic evaluation that may be needed or  ordered today. We also reviewed her medications today. she has been encouraged to call the office with any questions or concerns that should  arise related to todays visit.  Diabetes Counseling:  1. Addition of ACE inh/ ARB'S for nephroprotection. Microalbumin is updated  2. Diabetic foot care, prevention of complications. Podiatry consult 3. Exercise and lose weight.  4. Diabetic eye examination, Diabetic eye exam is updated  5. Monitor blood sugar closlely. nutrition counseling.  6. Sign and symptoms of hypoglycemia including shaking sweating,confusion and headaches.   This patient was seen by Leretha Pol FNP Collaboration with Dr Lavera Guise as a part of collaborative care agreement  Orders Placed This Encounter  Procedures  . POCT Urinalysis Dipstick  . Home sleep test    Meds ordered this encounter  Medications  . rosuvastatin (CRESTOR) 5 MG tablet    Sig: Take 1 tablet (5 mg total) by mouth daily.    Dispense:  30 tablet    Refill:  3    Order Specific Question:   Supervising Provider    Answer:   Lavera Guise [id number]  . gabapentin (NEURONTIN) 100 MG capsule    Sig: Take 1 capsule po every evening. May increase to twice daily as needed and as tolerated .    Dispense:  60 capsule    Refill:  3    Order Specific Question:   Supervising Provider    Answer:   Lavera Guise [id number]  . nitrofurantoin, macrocrystal-monohydrate, (MACROBID) 100 MG capsule    Sig: Take 1 capsule (100 mg total) by mouth 2 (two) times daily.    Dispense:  10 capsule    Refill:  0    Order Specific Question:   Supervising Provider    Answer:   Lavera Guise [id number]  . phenazopyridine (PYRIDIUM) 200 MG tablet    Sig: Take 1 tablet (200 mg total) by mouth 3 (three) times daily as needed for pain.    Dispense:  10 tablet    Refill:  0    Order Specific Question:   Supervising Provider    Answer:   Lavera Guise [id number]    Time spent: 47 Minutes      Dr Lavera Guise Internal medicine

## 2019-08-22 NOTE — Progress Notes (Signed)
Discuss with patient at visit 08/22/2019

## 2019-09-01 ENCOUNTER — Telehealth: Payer: Self-pay

## 2019-09-01 NOTE — Telephone Encounter (Signed)
Confirmed appointment with patient. klh °

## 2019-09-04 DIAGNOSIS — E1142 Type 2 diabetes mellitus with diabetic polyneuropathy: Secondary | ICD-10-CM | POA: Insufficient documentation

## 2019-09-04 DIAGNOSIS — I5189 Other ill-defined heart diseases: Secondary | ICD-10-CM | POA: Insufficient documentation

## 2019-09-04 DIAGNOSIS — R319 Hematuria, unspecified: Secondary | ICD-10-CM | POA: Insufficient documentation

## 2019-09-04 DIAGNOSIS — N390 Urinary tract infection, site not specified: Secondary | ICD-10-CM | POA: Insufficient documentation

## 2019-09-05 ENCOUNTER — Other Ambulatory Visit: Payer: Self-pay

## 2019-09-05 ENCOUNTER — Ambulatory Visit: Payer: No Typology Code available for payment source | Admitting: Internal Medicine

## 2019-09-05 DIAGNOSIS — G4719 Other hypersomnia: Secondary | ICD-10-CM

## 2019-09-11 NOTE — Procedures (Signed)
[facility] [place], [place] [address]  Sleep Specialist: Allyne Gee, MD [place] Sleep Study Interpretation  Patient Name: Valerie Ramos Patient MR [id number] DOB:1956-09-03  Date of Study: September 05 2019  Indications for study: Hypersomnia snoring  BMI: 35.1 kg/m       Respiratory Data:  Total AHI: 1.5/h  Total Obstructive Apneas: 0  Total Central Apneas: 0  Total Mixed Apneas: 0  Total Hypopneas: 10  If the AHI is greater than 5 per hour patient qualifies for PAP evaluation  Oximetry Data:  Oxygen Desaturation Index: 2.2/h  Lowest Desaturation: 86%  Cardiac Data:  Minimum Heart Rate: 63  Maximum Heart Rate: 101   Impression / Diagnosis:  This apnea study is basically within normal limits.  Patient did exhibit some oxygen desaturations and it may be worthwhile to check an overnight oximetry to determine if she qualifies for nocturnal oxygen.  Clinical correlation is recommended.  GENERAL Recommendations:  1.  Consider Auto PAP with pressure ranges 5-20 cmH20 with download, or facility based PAP Titration Study  2.  Consider PAP interface mask fitted for patient comfort, Heated Humidification & PAP compliance monitoring (1 month, 3 months & 12 months after PAP initiation)  3. Consider treatment with mandibular advancement splint (MAS) or referral to an ENT surgeon for modification to the upper airway if the patient prefers an alternate therapy or the PAP trial is unsuccessful  4. Sleep hygiene measures should be discussed with the patient  5. Behavioral therapy such as weight reduction or smoking cessation as appropriate for the patient  6. Advise patient against the use of alcohol or sedatives in so much as these substances can worsen excessive daytime sleepiness and respiratory disturbances of sleep  7. Advise patient against participating in potentially dangerous activities while drowsy such as operating a  motor vehicle, heavy equipment or power tools as it can put them and others in danger  8. Advise patient of the long term consequences of OSA if left untreated, need for treatment and close follow up  9. Clinical follow up as deemed necessary     This Level III home sleep study was performed using the [employer], a 4 channel screening device subject to limitations. Depending on actual total sleep time, not measured in this study, the AHI (sum of apneas and hypopneas/hr of sleep) and therefore the severity of sleep apnea may be underestimated. As with any single night study, including Level 1 attended PSG, severity of sleep apnea may also be underestimated due to the lack of supine and/or REM sleep.  The interpretation associated with this report is based on normal values and degrees of severity in accordance with AASM parameters and/or estimated from multiple sources in the literature for adults ages 59-80+. These may not agree with the displayed values. The patient's treating physician should use the interpretation and recommendations in conjunction with the overall clinical evaluation and treatment of the patient.  Some of the terminology used in this scored [facility] report was developed several years ago and may not always be in accordance with current nomenclature. This in no way affects the accuracy of the data or the reliability of the interpretation and recommendations.

## 2019-09-12 ENCOUNTER — Telehealth: Payer: Self-pay

## 2019-09-12 NOTE — Telephone Encounter (Signed)
Confirmed appointment with patient. klh °

## 2019-09-14 ENCOUNTER — Ambulatory Visit: Payer: No Typology Code available for payment source | Admitting: Internal Medicine

## 2019-09-14 ENCOUNTER — Encounter: Payer: Self-pay | Admitting: Internal Medicine

## 2019-09-14 ENCOUNTER — Telehealth: Payer: Self-pay

## 2019-09-14 DIAGNOSIS — G4734 Idiopathic sleep related nonobstructive alveolar hypoventilation: Secondary | ICD-10-CM | POA: Diagnosis not present

## 2019-09-14 DIAGNOSIS — I5189 Other ill-defined heart diseases: Secondary | ICD-10-CM | POA: Diagnosis not present

## 2019-09-14 DIAGNOSIS — I10 Essential (primary) hypertension: Secondary | ICD-10-CM

## 2019-09-14 NOTE — Telephone Encounter (Signed)
ERROR

## 2019-09-14 NOTE — Progress Notes (Signed)
[facility] [place], [place] [address]  Internal MEDICINE  Telephone Visit  Patient Name: Valerie Ramos  [id number]  [id number]  Date of Service: 09/14/2019  I connected with the patient at 358 by telephone and verified the patients identity using two identifiers.   I discussed the limitations, risks, security and privacy concerns of performing an evaluation and management service by telephone and the availability of in person appointments. I also discussed with the patient that there may be a patient responsible charge related to the service.  The patient expressed understanding and agrees to proceed.    Chief Complaint  Patient presents with  . Telephone Screen  . Telephone Assessment  . Follow-up    HOME SLEEP STUDY    HPI  Pt seen via telephone to establish care with pulmonary.  She had a echocardiogram by pcp that shows some diastolic dysfunction.  A sleep study was ordered, and the results were explained to patient today.  Her overall AHI is 1.5.  She does not appear to have OSA.  She did have a oxygen desaturation of 86%, and an overnight oximetry is recommended.       Current Medication: Outpatient Encounter Medications as of 09/14/2019  Medication Sig  . acetaminophen (TYLENOL) 325 MG tablet Take 2 tablets (650 mg total) by mouth every 6 (six) hours as needed for mild pain (or Fever >/= 101).  Marland Kitchen ALPRAZolam (XANAX) 0.5 MG tablet TAKE 1 TABLET TWICE A DAY AS NEEDED FOR ANXIETY  . cyclobenzaprine (FLEXERIL) 5 MG tablet Take 1 tablet (5 mg total) by mouth at bedtime as needed for muscle spasms.  Marland Kitchen dicyclomine (BENTYL) 10 MG capsule Take 1 capsule (10 mg total) by mouth 3 (three) times daily before meals.  . DULoxetine (CYMBALTA) 30 MG capsule Take 30 mg by mouth daily.  Marland Kitchen estrogens, conjugated, (PREMARIN) 0.9 MG tablet 1 TABLET, COATED BY MOUTH ONCE A DAY  . gabapentin (NEURONTIN) 100 MG capsule Take 1 capsule po every evening. May increase to twice  daily as needed and as tolerated .  . glucose blood (ONE TOUCH ULTRA TEST) test strip FOR ONCE DAILY TESTING DX. E11.65  . hydrochlorothiazide (HYDRODIURIL) 25 MG tablet TAKE 1 TABLET(S) BY MOUTH DAILY FOR BLOOD PRESSURE/EDEMA  . ibuprofen (ADVIL,MOTRIN) 800 MG tablet Take 1 tablet (800 mg total) by mouth 3 (three) times daily as needed.  Marland Kitchen losartan (COZAAR) 25 MG tablet Take 1 tablet (25 mg total) by mouth daily.  . metFORMIN (GLUCOPHAGE) 500 MG tablet Take 0.5 tablets (250 mg total) by mouth 2 (two) times daily with a meal.  . metoCLOPramide (REGLAN) 10 MG tablet TAKE 1 TABLET THREE TIMES A DAY AS NEEDED FOR REFLUX  . nitrofurantoin, macrocrystal-monohydrate, (MACROBID) 100 MG capsule Take 1 capsule (100 mg total) by mouth 2 (two) times daily.  Glory Rosebush Delica Lancets [id number] MISC 1 each by Does not apply route daily. Use as directed once daily diag e11.65  . pantoprazole (PROTONIX) 40 MG tablet Take 1 tablet (40 mg total) by mouth daily.  . phenazopyridine (PYRIDIUM) 200 MG tablet Take 1 tablet (200 mg total) by mouth 3 (three) times daily as needed for pain.  . rosuvastatin (CRESTOR) 5 MG tablet Take 1 tablet (5 mg total) by mouth daily.  . traMADol (ULTRAM) 50 MG tablet TAKE 1 TO 2 TABLETS BY MOUTH 3 TIMES A DAY AS NEEDED FOR PAIN  . zolpidem (AMBIEN) 10 MG tablet Take 1 tablet (10 mg total) by mouth  at bedtime as needed for sleep.  . famotidine (PEPCID) 20 MG tablet Take 1 tablet (20 mg total) by mouth 2 (two) times daily.   No facility-administered encounter medications on file as of 09/14/2019.    Surgical History: Past Surgical History:  Procedure Laterality Date  . ABDOMINAL HYSTERECTOMY    . CESAREAN SECTION  1989  . CYSTECTOMY  2000   Ovarian  . OOPHORECTOMY    . OVARIAN CYST SURGERY  2006  . RECTOVAGINAL FISTULA CLOSURE  1980    Medical History: Past Medical History:  Diagnosis Date  . Anxiety   . Arthritis   . Depression   . Diabetes 1.5, managed as type 2 ([place])   .  Hypertension   . Insomnia   . Migraines   . Reflux   . Sinusitis     Family History: Family History  Problem Relation Age of Onset  . Hodgkin's lymphoma Mother   . Diabetes Mother   . Thyroid disease Mother   . Glaucoma Mother   . Stroke Brother   . Heart disease Brother     Social History   Socioeconomic History  . Marital status: Married    Spouse name: Not on file  . Number of children: Not on file  . Years of education: Not on file  . Highest education level: Not on file  Occupational History  . Not on file  Tobacco Use  . Smoking status: Former Smoker    Quit date: 05/24/2011    Years since quitting: 8.3  . Smokeless tobacco: Never Used  Substance and Sexual Activity  . Alcohol use: No  . Drug use: No  . Sexual activity: Not on file  Other Topics Concern  . Not on file  Social History Narrative  . Not on file   Social Determinants of Health   Financial Resource Strain:   . Difficulty of Paying Living Expenses: Not on file  Food Insecurity:   . Worried About Charity fundraiser in the Last Year: Not on file  . Ran Out of Food in the Last Year: Not on file  Transportation Needs:   . Lack of Transportation (Medical): Not on file  . Lack of Transportation (Non-Medical): Not on file  Physical Activity:   . Days of Exercise per Week: Not on file  . Minutes of Exercise per Session: Not on file  Stress:   . Feeling of Stress : Not on file  Social Connections:   . Frequency of Communication with Friends and Family: Not on file  . Frequency of Social Gatherings with Friends and Family: Not on file  . Attends Religious Services: Not on file  . Active Member of Clubs or Organizations: Not on file  . Attends Archivist Meetings: Not on file  . Marital Status: Not on file  Intimate Partner Violence:   . Fear of Current or Ex-Partner: Not on file  . Emotionally Abused: Not on file  . Physically Abused: Not on file  . Sexually Abused: Not on file       Review of Systems  Constitutional: Negative for chills, fatigue and unexpected weight change.  HENT: Negative for congestion, rhinorrhea, sneezing and sore throat.   Eyes: Negative for photophobia, pain and redness.  Respiratory: Negative for cough, chest tightness and shortness of breath.   Cardiovascular: Negative for chest pain and palpitations.  Gastrointestinal: Negative for abdominal pain, constipation, diarrhea, nausea and vomiting.  Endocrine: Negative.   Genitourinary: Negative for dysuria  and frequency.  Musculoskeletal: Negative for arthralgias, back pain, joint swelling and neck pain.  Skin: Negative for rash.  Allergic/Immunologic: Negative.   Neurological: Negative for tremors and numbness.  Hematological: Negative for adenopathy. Does not bruise/bleed easily.  Psychiatric/Behavioral: Negative for behavioral problems and sleep disturbance. The patient is not nervous/anxious.     Vital Signs: There were no vitals taken for this visit.   Observation/Objective:  Well sounding, NAD noted.    Assessment/Plan: 1. Nocturnal oxygen desaturation Will get overnight oximetry, and follow up when results are available.  - Pulse oximetry, overnight; Future  2. Diastolic dysfunction Does not appear to be from OSA, Pt does have HTN and could be related.  3. Essential (primary) hypertension No bp today due to virtual visit.  Continue to monitor.   General Counseling: annalya reinsel understanding of the findings of today's phone visit and agrees with plan of treatment. I have discussed any further diagnostic evaluation that may be needed or ordered today. We also reviewed her medications today. she has been encouraged to call the office with any questions or concerns that should arise related to todays visit.    Orders Placed This Encounter  Procedures  . Pulse oximetry, overnight    No orders of the defined types were placed in this encounter.   Time spent:  [place] AGNP-C Internal medicine

## 2019-09-15 ENCOUNTER — Telehealth: Payer: Self-pay

## 2019-09-15 NOTE — Telephone Encounter (Signed)
Gave [employer] orders for Overnight Ox test. Valerie Ramos

## 2019-09-19 ENCOUNTER — Other Ambulatory Visit: Payer: Self-pay

## 2019-09-19 DIAGNOSIS — I10 Essential (primary) hypertension: Secondary | ICD-10-CM

## 2019-09-19 MED ORDER — LOSARTAN POTASSIUM 25 MG PO TABS: 25.0000 mg | ORAL_TABLET | Freq: Every day | ORAL | 3 refills | Status: DC

## 2019-09-20 ENCOUNTER — Telehealth: Payer: Self-pay

## 2019-09-21 ENCOUNTER — Other Ambulatory Visit: Payer: Self-pay

## 2019-09-21 DIAGNOSIS — K219 Gastro-esophageal reflux disease without esophagitis: Secondary | ICD-10-CM

## 2019-09-21 NOTE — Progress Notes (Signed)
Normal home sleep study. Discuss at visit 10/06/2019

## 2019-09-27 ENCOUNTER — Telehealth: Payer: Self-pay

## 2019-09-27 NOTE — Telephone Encounter (Signed)
Prior authorization for protonix has been approved from 09/21/2019-03/21/2020.tat

## 2019-10-03 ENCOUNTER — Other Ambulatory Visit: Payer: Self-pay

## 2019-10-04 ENCOUNTER — Telehealth: Payer: Self-pay

## 2019-10-04 NOTE — Telephone Encounter (Signed)
NO VM SET UP, ALTERNATE # THERE IS NO DIALTONE  I WAS UNABLE TO CONFRIM APPT FOR 10-06-19 OV.

## 2019-10-04 NOTE — Telephone Encounter (Signed)
Gave PA RX reminder to back staff. Valerie Ramos

## 2019-10-05 ENCOUNTER — Other Ambulatory Visit: Payer: Self-pay

## 2019-10-05 MED ORDER — METOCLOPRAMIDE HCL 10 MG PO TABS: ORAL_TABLET | ORAL | 0 refills | Status: DC

## 2019-10-06 ENCOUNTER — Ambulatory Visit: Payer: 59 | Admitting: Nurse Practitioner

## 2019-10-13 ENCOUNTER — Other Ambulatory Visit: Payer: Self-pay

## 2019-10-13 ENCOUNTER — Encounter: Payer: Self-pay | Admitting: Nurse Practitioner

## 2019-10-13 ENCOUNTER — Telehealth: Payer: Self-pay

## 2019-10-13 ENCOUNTER — Ambulatory Visit: Payer: No Typology Code available for payment source | Admitting: Nurse Practitioner

## 2019-10-13 VITALS — BP 144/83 | HR 80 | Ht 68.5 in | Wt 225.0 lb

## 2019-10-13 DIAGNOSIS — E1142 Type 2 diabetes mellitus with diabetic polyneuropathy: Secondary | ICD-10-CM

## 2019-10-13 DIAGNOSIS — I10 Essential (primary) hypertension: Secondary | ICD-10-CM

## 2019-10-13 DIAGNOSIS — I5189 Other ill-defined heart diseases: Secondary | ICD-10-CM

## 2019-10-13 DIAGNOSIS — Z1231 Encounter for screening mammogram for malignant neoplasm of breast: Secondary | ICD-10-CM | POA: Diagnosis not present

## 2019-10-13 NOTE — Progress Notes (Signed)
[facility] [place],  [address]  Internal MEDICINE  Telephone Visit  Patient Name: Valerie Ramos  [id number]  [id number]  Date of Service: 10/13/2019  I connected with the patient at 12:18pm by telephone and verified the patients identity using two identifiers.   I discussed the limitations, risks, security and privacy concerns of performing an evaluation and management service by telephone and the availability of in person appointments. I also discussed with the patient that there may be a patient responsible charge related to the service.  The patient expressed understanding and agrees to proceed.    Chief Complaint  Patient presents with  . Telephone Assessment  . Telephone Screen  . Follow-up    added gabapentin, takes every other day, make pt head hurt if she takes it everyday     The patient has been contacted via webcam for follow up visit due to concerns for spread of novel coronavirus. The patient presents for follow up visit. She was started on gabapentin due to polyneuropathy in her feet. She is taking 1 capsule at night. She states that she does get a bit of a headache from this, at least that whet she believes is causing the headache. She also feels very fatigued. Fatigue is all the time.  She did have home sleep study which did not indicate sleep apnea. There was concern for oxygen desaturations while sleeping. An overnight pulse oximetry study has been ordered for further evaluation.       Current Medication: Outpatient Encounter Medications as of 10/13/2019  Medication Sig  . acetaminophen (TYLENOL) 325 MG tablet Take 2 tablets (650 mg total) by mouth every 6 (six) hours as needed for mild pain (or Fever >/= 101).  Marland Kitchen ALPRAZolam (XANAX) 0.5 MG tablet TAKE 1 TABLET TWICE A DAY AS NEEDED FOR ANXIETY  . cyclobenzaprine (FLEXERIL) 5 MG tablet Take 1 tablet (5 mg total) by mouth at bedtime as needed for muscle spasms.  Marland Kitchen dicyclomine (BENTYL) 10  MG capsule Take 1 capsule (10 mg total) by mouth 3 (three) times daily before meals.  . DULoxetine (CYMBALTA) 30 MG capsule Take 30 mg by mouth daily.  Marland Kitchen estrogens, conjugated, (PREMARIN) 0.9 MG tablet 1 TABLET, COATED BY MOUTH ONCE A DAY  . gabapentin (NEURONTIN) 100 MG capsule Take 1 capsule po every evening. May increase to twice daily as needed and as tolerated .  . glucose blood (ONE TOUCH ULTRA TEST) test strip FOR ONCE DAILY TESTING DX. E11.65  . hydrochlorothiazide (HYDRODIURIL) 25 MG tablet TAKE 1 TABLET(S) BY MOUTH DAILY FOR BLOOD PRESSURE/EDEMA  . ibuprofen (ADVIL,MOTRIN) 800 MG tablet Take 1 tablet (800 mg total) by mouth 3 (three) times daily as needed.  Marland Kitchen losartan (COZAAR) 25 MG tablet Take 1 tablet (25 mg total) by mouth daily.  . metFORMIN (GLUCOPHAGE) 500 MG tablet Take 0.5 tablets (250 mg total) by mouth 2 (two) times daily with a meal.  . metoCLOPramide (REGLAN) 10 MG tablet TAKE 1 TABLET THREE TIMES A DAY AS NEEDED FOR REFLUX  . nitrofurantoin, macrocrystal-monohydrate, (MACROBID) 100 MG capsule Take 1 capsule (100 mg total) by mouth 2 (two) times daily.  Glory Rosebush Delica Lancets 99991111 MISC 1 each by Does not apply route daily. Use as directed once daily diag e11.65  . pantoprazole (PROTONIX) 40 MG tablet Take 1 tablet (40 mg total) by mouth daily.  . phenazopyridine (PYRIDIUM) 200 MG tablet Take 1 tablet (200 mg total) by mouth 3 (three) times daily as  needed for pain.  . rosuvastatin (CRESTOR) 5 MG tablet Take 1 tablet (5 mg total) by mouth daily.  . traMADol (ULTRAM) 50 MG tablet TAKE 1 TO 2 TABLETS BY MOUTH 3 TIMES A DAY AS NEEDED FOR PAIN  . zolpidem (AMBIEN) 10 MG tablet Take 1 tablet (10 mg total) by mouth at bedtime as needed for sleep.  . famotidine (PEPCID) 20 MG tablet Take 1 tablet (20 mg total) by mouth 2 (two) times daily.   No facility-administered encounter medications on file as of 10/13/2019.    Surgical History: Past Surgical History:  Procedure Laterality  Date  . ABDOMINAL HYSTERECTOMY    . CESAREAN SECTION  1989  . CYSTECTOMY  2000   Ovarian  . OOPHORECTOMY    . OVARIAN CYST SURGERY  2006  . RECTOVAGINAL FISTULA CLOSURE  1980    Medical History: Past Medical History:  Diagnosis Date  . Anxiety   . Arthritis   . Depression   . Diabetes 1.5, managed as type 2 ([place])   . Hypertension   . Insomnia   . Migraines   . Reflux   . Sinusitis     Family History: Family History  Problem Relation Age of Onset  . Hodgkin's lymphoma Mother   . Diabetes Mother   . Thyroid disease Mother   . Glaucoma Mother   . Stroke Brother   . Heart disease Brother     Social History   Socioeconomic History  . Marital status: Married    Spouse name: Not on file  . Number of children: Not on file  . Years of education: Not on file  . Highest education level: Not on file  Occupational History  . Not on file  Tobacco Use  . Smoking status: Former Smoker    Quit date: 05/24/2011    Years since quitting: 8.3  . Smokeless tobacco: Never Used  Substance and Sexual Activity  . Alcohol use: No  . Drug use: No  . Sexual activity: Not on file  Other Topics Concern  . Not on file  Social History Narrative  . Not on file   Social Determinants of Health   Financial Resource Strain:   . Difficulty of Paying Living Expenses: Not on file  Food Insecurity:   . Worried About Charity fundraiser in the Last Year: Not on file  . Ran Out of Food in the Last Year: Not on file  Transportation Needs:   . Lack of Transportation (Medical): Not on file  . Lack of Transportation (Non-Medical): Not on file  Physical Activity:   . Days of Exercise per Week: Not on file  . Minutes of Exercise per Session: Not on file  Stress:   . Feeling of Stress : Not on file  Social Connections:   . Frequency of Communication with Friends and Family: Not on file  . Frequency of Social Gatherings with Friends and Family: Not on file  . Attends Religious Services: Not  on file  . Active Member of Clubs or Organizations: Not on file  . Attends Archivist Meetings: Not on file  . Marital Status: Not on file  Intimate Partner Violence:   . Fear of Current or Ex-Partner: Not on file  . Emotionally Abused: Not on file  . Physically Abused: Not on file  . Sexually Abused: Not on file      Review of Systems  Constitutional: Positive for fatigue. Negative for activity change, chills and unexpected weight  change.  HENT: Negative for congestion, postnasal drip, rhinorrhea, sneezing and sore throat.   Respiratory: Negative for cough, chest tightness, shortness of breath and wheezing.   Cardiovascular: Negative for chest pain and palpitations.  Gastrointestinal: Negative for abdominal pain, constipation, diarrhea, nausea and vomiting.  Endocrine: Negative for cold intolerance, heat intolerance, polydipsia and polyuria.  Musculoskeletal: Negative for arthralgias, back pain, joint swelling and neck pain.  Skin: Negative for rash.  Allergic/Immunologic: Negative for environmental allergies.  Neurological: Positive for numbness. Negative for tremors.       Burning sensation in both feet. This has improved some since starting on gabapentin 100mg  at bedtime.   Hematological: Negative for adenopathy. Does not bruise/bleed easily.  Psychiatric/Behavioral: Negative for behavioral problems (Depression), sleep disturbance and suicidal ideas. The patient is nervous/anxious.     Today's Vitals   10/13/19 1111  BP: (!) 144/83  Pulse: 80  Weight: 225 lb (102.1 kg)  Height: 5' 8.5" (1.74 m)   Body mass index is 33.71 kg/m.  Observation/Objective:   The patient is alert and oriented. She is pleasant and answers all questions appropriately. Breathing is non-labored. She is in no acute distress at this time.    Assessment/Plan: 1. Diabetic polyneuropathy associated with type 2 diabetes mellitus ([place]) Improved some with addition of gabapentin 100mg  at  bedtime. No changes to this. Will consider changing to lyrica if headache and fatigue are persistent.   2. Encounter for screening mammogram for malignant neoplasm of breast - screening mammo; Future  3. Essential (primary) hypertension Stable. Continue bp medication as prescribed   4. Diastolic dysfunction Negative sleep study. Overnight pulse oximetry study ordered.   General Counseling: leylah knierim understanding of the findings of today's phone visit and agrees with plan of treatment. I have discussed any further diagnostic evaluation that may be needed or ordered today. We also reviewed her medications today. she has been encouraged to call the office with any questions or concerns that should arise related to todays visit.    Orders Placed This Encounter  Procedures  . screening mammo   Diabetes Counseling:  1. Addition of ACE inh/ ARB'S for nephroprotection. Microalbumin is updated  2. Diabetic foot care, prevention of complications. Podiatry consult 3. Exercise and lose weight.  4. Diabetic eye examination, Diabetic eye exam is updated  5. Monitor blood sugar closlely. nutrition counseling.  6. Sign and symptoms of hypoglycemia including shaking sweating,confusion and headaches.  This patient was seen by Leretha Pol FNP Collaboration with Dr Lavera Guise as a part of collaborative care agreement   Time spent: 20 Minutes  Time spent with patient included reviewing progress notes, labs, imaging studies, and discussing plan for follow up.   Dr Lavera Guise Internal medicine

## 2019-10-13 NOTE — Telephone Encounter (Signed)
Patient scheduled for a mammogram on 10/27/2019 @ 1:10pm at [place]. [phone]

## 2019-10-25 ENCOUNTER — Other Ambulatory Visit: Payer: Self-pay

## 2019-10-25 DIAGNOSIS — I10 Essential (primary) hypertension: Secondary | ICD-10-CM

## 2019-10-25 MED ORDER — LOSARTAN POTASSIUM 25 MG PO TABS: 25.0000 mg | ORAL_TABLET | Freq: Every day | ORAL | 3 refills | Status: DC

## 2019-11-10 ENCOUNTER — Telehealth: Payer: Self-pay

## 2019-11-10 NOTE — Telephone Encounter (Signed)
COMPLETED RECORD REQUEST PER [employer] AND FAXED REQUESTED INFORMATION TO [phone].

## 2019-11-14 ENCOUNTER — Other Ambulatory Visit: Payer: Self-pay

## 2019-11-14 DIAGNOSIS — E1165 Type 2 diabetes mellitus with hyperglycemia: Secondary | ICD-10-CM

## 2019-11-14 DIAGNOSIS — M5000 Cervical disc disorder with myelopathy, unspecified cervical region: Secondary | ICD-10-CM

## 2019-11-14 MED ORDER — CYCLOBENZAPRINE HCL 5 MG PO TABS: 5.0000 mg | ORAL_TABLET | Freq: Every evening | ORAL | 2 refills | Status: DC | PRN

## 2019-11-14 MED ORDER — ROSUVASTATIN CALCIUM 5 MG PO TABS: 5.0000 mg | ORAL_TABLET | Freq: Every day | ORAL | 3 refills | Status: DC

## 2019-11-16 ENCOUNTER — Other Ambulatory Visit: Payer: Self-pay

## 2019-11-16 MED ORDER — METOCLOPRAMIDE HCL 10 MG PO TABS: ORAL_TABLET | ORAL | 0 refills | Status: DC

## 2019-11-29 ENCOUNTER — Telehealth: Payer: Self-pay

## 2019-11-29 NOTE — Telephone Encounter (Signed)
CONFIRMED AND SCREENED FOR 12-01-19 OV.

## 2019-12-01 ENCOUNTER — Encounter: Payer: Self-pay | Admitting: Nurse Practitioner

## 2019-12-01 ENCOUNTER — Ambulatory Visit: Payer: No Typology Code available for payment source | Admitting: Nurse Practitioner

## 2019-12-01 ENCOUNTER — Other Ambulatory Visit: Payer: Self-pay

## 2019-12-01 ENCOUNTER — Ambulatory Visit
Admission: RE | Admit: 2019-12-01 | Discharge: 2019-12-01 | Disposition: A | Discharge status: Home / Self Care | Payer: 59 | Source: Ambulatory Visit | Attending: Nurse Practitioner | Admitting: Nurse Practitioner

## 2019-12-01 VITALS — BP 145/76 | HR 95 | Temp 97.1°F | Resp 16 | Ht 68.0 in | Wt 237.6 lb

## 2019-12-01 DIAGNOSIS — E1142 Type 2 diabetes mellitus with diabetic polyneuropathy: Secondary | ICD-10-CM

## 2019-12-01 DIAGNOSIS — I10 Essential (primary) hypertension: Secondary | ICD-10-CM

## 2019-12-01 DIAGNOSIS — M546 Pain in thoracic spine: Secondary | ICD-10-CM | POA: Insufficient documentation

## 2019-12-01 DIAGNOSIS — E1165 Type 2 diabetes mellitus with hyperglycemia: Secondary | ICD-10-CM

## 2019-12-01 DIAGNOSIS — F5101 Primary insomnia: Secondary | ICD-10-CM

## 2019-12-01 LAB — POCT GLYCOSYLATED HEMOGLOBIN (HGB A1C): Hemoglobin A1C: 7 % — AB (ref 4.0–5.6)

## 2019-12-01 MED ORDER — GLUCOSE BLOOD VI STRP: ORAL_STRIP | 3 refills | Status: DC

## 2019-12-01 MED ORDER — MELOXICAM 7.5 MG PO TABS: 7.5000 mg | ORAL_TABLET | Freq: Every day | ORAL | 3 refills | Status: DC

## 2019-12-01 MED ORDER — ZOLPIDEM TARTRATE 10 MG PO TABS: 10.0000 mg | ORAL_TABLET | Freq: Every evening | ORAL | 3 refills | Status: DC | PRN

## 2019-12-01 MED ORDER — TIZANIDINE HCL 2 MG PO TABS: 2.0000 mg | ORAL_TABLET | Freq: Two times a day (BID) | ORAL | 3 refills | Status: DC | PRN

## 2019-12-01 NOTE — Progress Notes (Signed)
[facility] [place], [place] [address]  Internal MEDICINE  Office Visit Note  Patient Name: Valerie Ramos  [id number]  [id number]  Date of Service: 12/04/2019  Chief Complaint  Patient presents with  . Diabetes  . Hypertension  . Arthritis  . Back Pain    middle back hurting for a month  . Nasal Congestion    stuck in throat, always has to clear throat    The patient is here for routine follow up. Today, she is c/o mid back pain. Pain is worse with bending at the waist and lifting and reaching with the arms. Denies any injury, trauma, or fall which would effect the mid back. She does have mild to moderate degenerative disc changes in cervical spine.  Her blood sugar and blood pressure are both well controlled. Her HgbA1c is 7.0 today.        Current Medication: Outpatient Encounter Medications as of 12/01/2019  Medication Sig Note  . acetaminophen (TYLENOL) 325 MG tablet Take 2 tablets (650 mg total) by mouth every 6 (six) hours as needed for mild pain (or Fever >/= 101).   Marland Kitchen ALPRAZolam (XANAX) 0.5 MG tablet TAKE 1 TABLET TWICE A DAY AS NEEDED FOR ANXIETY   . dicyclomine (BENTYL) 10 MG capsule Take 1 capsule (10 mg total) by mouth 3 (three) times daily before meals.   . DULoxetine (CYMBALTA) 30 MG capsule Take 30 mg by mouth daily.   Marland Kitchen estrogens, conjugated, (PREMARIN) 0.9 MG tablet 1 TABLET, COATED BY MOUTH ONCE A DAY   . gabapentin (NEURONTIN) 100 MG capsule Take 1 capsule po every evening. May increase to twice daily as needed and as tolerated .   . hydrochlorothiazide (HYDRODIURIL) 25 MG tablet TAKE 1 TABLET(S) BY MOUTH DAILY FOR BLOOD PRESSURE/EDEMA   . losartan (COZAAR) 25 MG tablet Take 1 tablet (25 mg total) by mouth daily.   . metFORMIN (GLUCOPHAGE) 500 MG tablet Take 0.5 tablets (250 mg total) by mouth 2 (two) times daily with a meal.   . metoCLOPramide (REGLAN) 10 MG tablet TAKE 1 TABLET THREE TIMES A DAY AS NEEDED FOR REFLUX   . OneTouch Delica  Lancets 99991111 MISC 1 each by Does not apply route daily. Use as directed once daily diag e11.65   . pantoprazole (PROTONIX) 40 MG tablet Take 1 tablet (40 mg total) by mouth daily.   . phenazopyridine (PYRIDIUM) 200 MG tablet Take 1 tablet (200 mg total) by mouth 3 (three) times daily as needed for pain.   . rosuvastatin (CRESTOR) 5 MG tablet Take 1 tablet (5 mg total) by mouth daily.   . traMADol (ULTRAM) 50 MG tablet TAKE 1 TO 2 TABLETS BY MOUTH 3 TIMES A DAY AS NEEDED FOR PAIN   . zolpidem (AMBIEN) 10 MG tablet Take 1 tablet (10 mg total) by mouth at bedtime as needed for sleep.   . [DISCONTINUED] cyclobenzaprine (FLEXERIL) 5 MG tablet Take 1 tablet (5 mg total) by mouth at bedtime as needed for muscle spasms.   . [DISCONTINUED] glucose blood (ONE TOUCH ULTRA TEST) test strip FOR ONCE DAILY TESTING DX. E11.65   . [DISCONTINUED] glucose blood (ONE TOUCH ULTRA TEST) test strip FOR ONCE DAILY TESTING DX. E11.65   . [DISCONTINUED] ibuprofen (ADVIL,MOTRIN) 800 MG tablet Take 1 tablet (800 mg total) by mouth 3 (three) times daily as needed. 12/01/2019: will change to meloxicam  . [DISCONTINUED] zolpidem (AMBIEN) 10 MG tablet Take 1 tablet (10 mg total) by mouth at  bedtime as needed for sleep.   . famotidine (PEPCID) 20 MG tablet Take 1 tablet (20 mg total) by mouth 2 (two) times daily.   . meloxicam (MOBIC) 7.5 MG tablet Take 1 tablet (7.5 mg total) by mouth daily.   Marland Kitchen tiZANidine (ZANAFLEX) 2 MG tablet Take 1 tablet (2 mg total) by mouth 2 (two) times daily as needed for muscle spasms.   . [DISCONTINUED] nitrofurantoin, macrocrystal-monohydrate, (MACROBID) 100 MG capsule Take 1 capsule (100 mg total) by mouth 2 (two) times daily. (Patient not taking: Reported on 12/01/2019)    No facility-administered encounter medications on file as of 12/01/2019.    Surgical History: Past Surgical History:  Procedure Laterality Date  . ABDOMINAL HYSTERECTOMY    . CESAREAN SECTION  1989  . CYSTECTOMY  2000   Ovarian   . OOPHORECTOMY    . OVARIAN CYST SURGERY  2006  . RECTOVAGINAL FISTULA CLOSURE  1980    Medical History: Past Medical History:  Diagnosis Date  . Anxiety   . Arthritis   . Depression   . Diabetes 1.5, managed as type 2 ([place])   . Hypertension   . Insomnia   . Migraines   . Reflux   . Sinusitis     Family History: Family History  Problem Relation Age of Onset  . Hodgkin's lymphoma Mother   . Diabetes Mother   . Thyroid disease Mother   . Glaucoma Mother   . Stroke Brother   . Heart disease Brother     Social History   Socioeconomic History  . Marital status: Married    Spouse name: Not on file  . Number of children: Not on file  . Years of education: Not on file  . Highest education level: Not on file  Occupational History  . Not on file  Tobacco Use  . Smoking status: Former Smoker    Quit date: 05/24/2011    Years since quitting: 8.5  . Smokeless tobacco: Never Used  Substance and Sexual Activity  . Alcohol use: No  . Drug use: No  . Sexual activity: Not on file  Other Topics Concern  . Not on file  Social History Narrative  . Not on file   Social Determinants of Health   Financial Resource Strain:   . Difficulty of Paying Living Expenses: Not on file  Food Insecurity:   . Worried About Charity fundraiser in the Last Year: Not on file  . Ran Out of Food in the Last Year: Not on file  Transportation Needs:   . Lack of Transportation (Medical): Not on file  . Lack of Transportation (Non-Medical): Not on file  Physical Activity:   . Days of Exercise per Week: Not on file  . Minutes of Exercise per Session: Not on file  Stress:   . Feeling of Stress : Not on file  Social Connections:   . Frequency of Communication with Friends and Family: Not on file  . Frequency of Social Gatherings with Friends and Family: Not on file  . Attends Religious Services: Not on file  . Active Member of Clubs or Organizations: Not on file  . Attends Theatre manager Meetings: Not on file  . Marital Status: Not on file  Intimate Partner Violence:   . Fear of Current or Ex-Partner: Not on file  . Emotionally Abused: Not on file  . Physically Abused: Not on file  . Sexually Abused: Not on file      Review  of Systems  Constitutional: Positive for fatigue. Negative for chills and unexpected weight change.  HENT: Negative for congestion, postnasal drip, rhinorrhea, sneezing and sore throat.   Respiratory: Negative for cough, chest tightness, shortness of breath and wheezing.   Cardiovascular: Negative for chest pain and palpitations.  Gastrointestinal: Negative for abdominal pain, constipation, diarrhea, nausea and vomiting.  Endocrine: Negative for cold intolerance, heat intolerance, polydipsia and polyuria.       Blood sugars doing well   Musculoskeletal: Positive for back pain. Negative for arthralgias, joint swelling and neck pain.       Pain in mid back without trauma, injury, or fall.   Skin: Negative for rash.  Allergic/Immunologic: Negative for environmental allergies.  Neurological: Positive for numbness. Negative for tremors.       Burning sensation in both feet. This has improved some since starting on gabapentin 100mg  at bedtime.   Hematological: Negative for adenopathy. Does not bruise/bleed easily.  Psychiatric/Behavioral: Negative for behavioral problems (Depression), sleep disturbance and suicidal ideas. The patient is nervous/anxious.     Today's Vitals   12/01/19 1116  BP: (!) 145/76  Pulse: 95  Resp: 16  Temp: (!) 97.1 F (36.2 C)  SpO2: 97%  Weight: 237 lb 9.6 oz (107.8 kg)  Height: 5\' 8"  (1.727 m)   Body mass index is 36.13 kg/m.   Physical Exam Vitals and nursing note reviewed.  Constitutional:      General: She is not in acute distress.    Appearance: Normal appearance. She is well-developed. She is not diaphoretic.  HENT:     Head: Normocephalic and atraumatic.     Nose: Nose normal.      Mouth/Throat:     Pharynx: No oropharyngeal exudate.  Eyes:     Extraocular Movements: Extraocular movements intact.     Pupils: Pupils are equal, round, and reactive to light.  Neck:     Thyroid: No thyromegaly.     Vascular: No JVD.     Trachea: No tracheal deviation.  Cardiovascular:     Rate and Rhythm: Normal rate and regular rhythm.     Heart sounds: Normal heart sounds. No murmur. No friction rub. No gallop.   Pulmonary:     Effort: Pulmonary effort is normal. No respiratory distress.     Breath sounds: Normal breath sounds. No wheezing or rales.  Chest:     Chest wall: No tenderness.  Abdominal:     Palpations: Abdomen is soft.  Musculoskeletal:        General: Tenderness present. Normal range of motion.     Cervical back: Normal range of motion and neck supple.     Comments: Mid back pain. Tender with palpation of the thoracic spine. No abnormalities or deformities are noted at this time. No swelling or bruising is present.   Lymphadenopathy:     Cervical: No cervical adenopathy.  Skin:    General: Skin is warm and dry.  Neurological:     Mental Status: She is alert and oriented to person, place, and time.     Cranial Nerves: No cranial nerve deficit.  Psychiatric:        Attention and Perception: Attention normal.        Mood and Affect: Mood is anxious and depressed.        Speech: Speech normal.        Behavior: Behavior normal. Behavior is cooperative.        Thought Content: Thought content normal.  Cognition and Memory: Cognition and memory normal.        Judgment: Judgment normal.    Assessment/Plan: 1. Dorsalgia of thoracic region D/c ibuprofen. Start meloxicam 7.5mg  daily to prevent pain and inflammation. Change cyclobenzaprine to tizanidine 2mg  which may be taken up to twice daily to help with muscle pain and tightness. Will x-ray the thoracic spine for further evaluation.  - DG Thoracic Spine 2 View; Future - meloxicam (MOBIC) 7.5 MG tablet; Take  1 tablet (7.5 mg total) by mouth daily.  Dispense: 30 tablet; Refill: 3 - tiZANidine (ZANAFLEX) 2 MG tablet; Take 1 tablet (2 mg total) by mouth 2 (two) times daily as needed for muscle spasms.  Dispense: 45 tablet; Refill: 3  2. Type 2 diabetes mellitus with hyperglycemia, unspecified whether long term insulin use (HCC) - POCT HgB A1C 7.0 today. Continue diabetic medication as prescribed.   3. Essential (primary) hypertension Generally stable. Continue bp medication a prescribed   4. Diabetic polyneuropathy associated with type 2 diabetes mellitus ([place]) May increase dose to two capsules up to twice daily as needed for neuropathy.   5. Primary insomnia May continue ambien 10mg  at bedtime as needed for insomnia. New prescription sent to her pharmacy today.  - zolpidem (AMBIEN) 10 MG tablet; Take 1 tablet (10 mg total) by mouth at bedtime as needed for sleep.  Dispense: 30 tablet; Refill: 3  General Counseling: Safari verbalizes understanding of the findings of todays visit and agrees with plan of treatment. I have discussed any further diagnostic evaluation that may be needed or ordered today. We also reviewed her medications today. she has been encouraged to call the office with any questions or concerns that should arise related to todays visit.  Diabetes Counseling:  1. Addition of ACE inh/ ARB'S for nephroprotection. Microalbumin is updated  2. Diabetic foot care, prevention of complications. Podiatry consult 3. Exercise and lose weight.  4. Diabetic eye examination, Diabetic eye exam is updated  5. Monitor blood sugar closlely. nutrition counseling.  6. Sign and symptoms of hypoglycemia including shaking sweating,confusion and headaches.  This patient was seen by Leretha Pol FNP Collaboration with Dr Lavera Guise as a part of collaborative care agreement   Orders Placed This Encounter  Procedures  . DG Thoracic Spine 2 View  . POCT HgB A1C    Meds ordered this encounter   Medications  . DISCONTD: glucose blood (ONE TOUCH ULTRA TEST) test strip    Sig: FOR ONCE DAILY TESTING DX. E11.65    Dispense:  100 each    Refill:  3    Order Specific Question:   Supervising Provider    Answer:   Lavera Guise [place]  . zolpidem (AMBIEN) 10 MG tablet    Sig: Take 1 tablet (10 mg total) by mouth at bedtime as needed for sleep.    Dispense:  30 tablet    Refill:  3    Order Specific Question:   Supervising Provider    Answer:   Lavera Guise [id number]  . meloxicam (MOBIC) 7.5 MG tablet    Sig: Take 1 tablet (7.5 mg total) by mouth daily.    Dispense:  30 tablet    Refill:  3    Order Specific Question:   Supervising Provider    Answer:   Lavera Guise [id number]  . tiZANidine (ZANAFLEX) 2 MG tablet    Sig: Take 1 tablet (2 mg total) by mouth 2 (two) times daily as needed for muscle  spasms.    Dispense:  45 tablet    Refill:  3    Order Specific Question:   Supervising Provider    Answer:   Lavera Guise [id number]    Total time spent: 30 Minutes   Time spent includes review of chart, medications, test results, and follow up plan with the patient.      Dr Lavera Guise Internal medicine

## 2019-12-03 ENCOUNTER — Ambulatory Visit: Payer: 59 | Attending: Internal Medicine

## 2019-12-03 DIAGNOSIS — Z23 Encounter for immunization: Secondary | ICD-10-CM | POA: Insufficient documentation

## 2019-12-03 NOTE — Progress Notes (Signed)
   Covid-19 Vaccination Clinic  Name:  Valerie Ramos    MRN: [id number] DOB: 23-Apr-1956  12/03/2019  [name] was observed post Covid-19 immunization for 15 minutes without incident. She was provided with Vaccine Information Sheet and instruction to access the V-Safe system.   [name] was instructed to call 911 with any severe reactions post vaccine: Marland Kitchen Difficulty breathing  . Swelling of face and throat  . A fast heartbeat  . A bad rash all over body  . Dizziness and weakness   Immunizations Administered    Name Date Dose VIS Date Route   Moderna COVID-19 Vaccine 12/03/2019  4:45 PM 0.5 mL 08/30/2019 Intramuscular   Manufacturer: Moderna   Lot: [id number]   [id number]

## 2019-12-04 DIAGNOSIS — M546 Pain in thoracic spine: Secondary | ICD-10-CM | POA: Insufficient documentation

## 2019-12-05 ENCOUNTER — Other Ambulatory Visit: Payer: Self-pay

## 2019-12-05 MED ORDER — ONETOUCH DELICA LANCETS 30G MISC: 1.0000 | Freq: Every day | 3 refills | Status: DC

## 2019-12-07 ENCOUNTER — Telehealth: Payer: Self-pay

## 2019-12-07 NOTE — Telephone Encounter (Signed)
PRIOR AUTHORIZATION FOR PANTOPRAZOLE AND PREMARIN HAS BEEN APPROVED.TAT

## 2019-12-08 ENCOUNTER — Ambulatory Visit: Payer: 59

## 2019-12-28 ENCOUNTER — Telehealth: Payer: Self-pay

## 2019-12-28 NOTE — Telephone Encounter (Signed)
Confirmed and screened for 01-02-20 ov.

## 2019-12-31 ENCOUNTER — Ambulatory Visit: Payer: 59 | Attending: Internal Medicine

## 2019-12-31 ENCOUNTER — Ambulatory Visit: Payer: 59

## 2019-12-31 DIAGNOSIS — Z23 Encounter for immunization: Secondary | ICD-10-CM

## 2019-12-31 NOTE — Progress Notes (Signed)
   Covid-19 Vaccination Clinic  Name:  Valerie Ramos    MRN: [id number] DOB: 1956-01-15  12/31/2019  [name] was observed post Covid-19 immunization for 15 minutes without incident. She was provided with Vaccine Information Sheet and instruction to access the V-Safe system.   [name] was instructed to call 911 with any severe reactions post vaccine: Marland Kitchen Difficulty breathing  . Swelling of face and throat  . A fast heartbeat  . A bad rash all over body  . Dizziness and weakness   Immunizations Administered    Name Date Dose VIS Date Route   Moderna COVID-19 Vaccine 12/31/2019  1:40 PM 0.5 mL 08/30/2019 Intramuscular   Manufacturer: Levan Hurst   [id number]   [id number]

## 2020-01-02 ENCOUNTER — Ambulatory Visit: Payer: No Typology Code available for payment source | Admitting: Nurse Practitioner

## 2020-01-02 ENCOUNTER — Other Ambulatory Visit: Payer: Self-pay

## 2020-01-02 ENCOUNTER — Encounter: Payer: Self-pay | Admitting: Nurse Practitioner

## 2020-01-02 VITALS — BP 147/77 | HR 87 | Temp 97.0°F | Resp 16 | Ht 68.0 in | Wt 238.8 lb

## 2020-01-02 DIAGNOSIS — I10 Essential (primary) hypertension: Secondary | ICD-10-CM

## 2020-01-02 DIAGNOSIS — F411 Generalized anxiety disorder: Secondary | ICD-10-CM | POA: Diagnosis not present

## 2020-01-02 DIAGNOSIS — M546 Pain in thoracic spine: Secondary | ICD-10-CM

## 2020-01-02 DIAGNOSIS — I83893 Varicose veins of bilateral lower extremities with other complications: Secondary | ICD-10-CM | POA: Diagnosis not present

## 2020-01-02 MED ORDER — ALPRAZOLAM 0.5 MG PO TABS: ORAL_TABLET | ORAL | 3 refills | Status: DC

## 2020-01-02 NOTE — Progress Notes (Signed)
[facility] [place], [place] [address]  Internal MEDICINE  Office Visit Note  Patient Name: Valerie Ramos  [id number]  [id number]  Date of Service: 01/14/2020   Pt is here for a sick visit.   Chief Complaint  Patient presents with  . Leg Swelling    both legs, more in right leg, experiencing pain in right leg going on for 2 weeks  . Foot Swelling    both feet, more in right foot going on for  2 weeks      The patient presents for sick visit. Today, she is complaining of lower leg pain and swelling of her ankles and feet. This has been going on for a few days. Gets worse when she is on her feet for a while and gets a little better with rest. The blood pressure is mildly elevated and is generally well controlled. She continues to have moderate lower back pain and she Is unsure if the swelling and discomfort she is feeling is related to the issues she has with her back. She did have an x-ray of the thoracic spine which showed mild scoliosis and degenerative disc disease.       Current Medication:  Outpatient Encounter Medications as of 01/02/2020  Medication Sig  . acetaminophen (TYLENOL) 325 MG tablet Take 2 tablets (650 mg total) by mouth every 6 (six) hours as needed for mild pain (or Fever >/= 101).  Marland Kitchen ALPRAZolam (XANAX) 0.5 MG tablet TAKE 1 TABLET TWICE A DAY AS NEEDED FOR ANXIETY  . dicyclomine (BENTYL) 10 MG capsule Take 1 capsule (10 mg total) by mouth 3 (three) times daily before meals.  . DULoxetine (CYMBALTA) 30 MG capsule Take 30 mg by mouth daily.  Marland Kitchen estrogens, conjugated, (PREMARIN) 0.9 MG tablet 1 TABLET, COATED BY MOUTH ONCE A DAY  . glucose blood (ONE TOUCH ULTRA TEST) test strip FOR ONCE DAILY TESTING DX. E11.65  . hydrochlorothiazide (HYDRODIURIL) 25 MG tablet TAKE 1 TABLET(S) BY MOUTH DAILY FOR BLOOD PRESSURE/EDEMA  . losartan (COZAAR) 25 MG tablet Take 1 tablet (25 mg total) by mouth daily.  . meloxicam (MOBIC) 7.5 MG tablet Take 1  tablet (7.5 mg total) by mouth daily.  . metFORMIN (GLUCOPHAGE) 500 MG tablet Take 0.5 tablets (250 mg total) by mouth 2 (two) times daily with a meal.  . metoCLOPramide (REGLAN) 10 MG tablet TAKE 1 TABLET THREE TIMES A DAY AS NEEDED FOR REFLUX  . OneTouch Delica Lancets 99991111 MISC 1 each by Does not apply route daily. Use as directed once daily diag e11.65  . pantoprazole (PROTONIX) 40 MG tablet Take 1 tablet (40 mg total) by mouth daily.  . phenazopyridine (PYRIDIUM) 200 MG tablet Take 1 tablet (200 mg total) by mouth 3 (three) times daily as needed for pain.  . rosuvastatin (CRESTOR) 5 MG tablet Take 1 tablet (5 mg total) by mouth daily.  Marland Kitchen tiZANidine (ZANAFLEX) 2 MG tablet Take 1 tablet (2 mg total) by mouth 2 (two) times daily as needed for muscle spasms.  . traMADol (ULTRAM) 50 MG tablet TAKE 1 TO 2 TABLETS BY MOUTH 3 TIMES A DAY AS NEEDED FOR PAIN  . zolpidem (AMBIEN) 10 MG tablet Take 1 tablet (10 mg total) by mouth at bedtime as needed for sleep.  . [DISCONTINUED] ALPRAZolam (XANAX) 0.5 MG tablet TAKE 1 TABLET TWICE A DAY AS NEEDED FOR ANXIETY  . [DISCONTINUED] gabapentin (NEURONTIN) 100 MG capsule Take 1 capsule po every evening. May increase to twice  daily as needed and as tolerated .  . famotidine (PEPCID) 20 MG tablet Take 1 tablet (20 mg total) by mouth 2 (two) times daily.   No facility-administered encounter medications on file as of 01/02/2020.      Medical History: Past Medical History:  Diagnosis Date  . Anxiety   . Arthritis   . Depression   . Diabetes 1.5, managed as type 2 ([place])   . Hypertension   . Insomnia   . Migraines   . Reflux   . Sinusitis      Today's Vitals   01/02/20 1108  BP: (!) 147/77  Pulse: 87  Resp: 16  Temp: (!) 97 F (36.1 C)  SpO2: 94%  Weight: 238 lb 12.8 oz (108.3 kg)  Height: 5\' 8"  (1.727 m)   Body mass index is 36.31 kg/m.  Review of Systems  Constitutional: Positive for fatigue. Negative for chills and unexpected weight  change.  HENT: Negative for congestion, postnasal drip, rhinorrhea, sneezing and sore throat.   Respiratory: Negative for cough, chest tightness, shortness of breath and wheezing.   Cardiovascular: Positive for leg swelling. Negative for chest pain and palpitations.  Gastrointestinal: Negative for abdominal pain, constipation, diarrhea, nausea and vomiting.  Endocrine: Negative for cold intolerance, heat intolerance, polydipsia and polyuria.       Blood sugars doing well   Musculoskeletal: Positive for back pain. Negative for arthralgias, joint swelling and neck pain.       Pain in mid back without trauma, injury, or fall.   Skin: Negative for rash.  Allergic/Immunologic: Negative for environmental allergies.  Neurological: Positive for numbness. Negative for tremors.       Burning sensation in both feet. This has improved some since starting on gabapentin 100mg  at bedtime.   Hematological: Negative for adenopathy. Does not bruise/bleed easily.  Psychiatric/Behavioral: Negative for behavioral problems (Depression), sleep disturbance and suicidal ideas. The patient is nervous/anxious.     Physical Exam Vitals and nursing note reviewed.  Constitutional:      General: She is not in acute distress.    Appearance: Normal appearance. She is well-developed. She is not diaphoretic.  HENT:     Head: Normocephalic and atraumatic.     Nose: Nose normal.     Mouth/Throat:     Pharynx: No oropharyngeal exudate.  Eyes:     Extraocular Movements: Extraocular movements intact.     Pupils: Pupils are equal, round, and reactive to light.  Neck:     Thyroid: No thyromegaly.     Vascular: No JVD.     Trachea: No tracheal deviation.  Cardiovascular:     Rate and Rhythm: Normal rate and regular rhythm.     Heart sounds: Normal heart sounds. No murmur. No friction rub. No gallop.      Comments: There is currently mild swelling in both lower legs and both ankles and feet. It is non pitting. There is  mild tenderness with palpation.  Pulmonary:     Effort: Pulmonary effort is normal. No respiratory distress.     Breath sounds: Normal breath sounds. No wheezing or rales.  Chest:     Chest wall: No tenderness.  Abdominal:     Palpations: Abdomen is soft.  Musculoskeletal:        General: Tenderness present. Normal range of motion.     Cervical back: Normal range of motion and neck supple.     Comments: Mid back pain. Tender with palpation of the thoracic spine. No abnormalities or  deformities are noted at this time. No swelling or bruising is present.   Lymphadenopathy:     Cervical: No cervical adenopathy.  Skin:    General: Skin is warm and dry.  Neurological:     Mental Status: She is alert and oriented to person, place, and time.     Cranial Nerves: No cranial nerve deficit.  Psychiatric:        Attention and Perception: Attention normal.        Mood and Affect: Mood is anxious and depressed.        Speech: Speech normal.        Behavior: Behavior normal. Behavior is cooperative.        Thought Content: Thought content normal.        Cognition and Memory: Cognition and memory normal.        Judgment: Judgment normal.   Assessment/Plan: 1. Varicose veins of leg with edema, bilateral Mild swelling and tenderness with palpation of both lower legs and ankles. There are varicose veins present, bilaterally. Will get vascular ultrasound to evaluation for vascular reflux.  - VAS [place] LOWER EXTREMITY VENOUS REFLUX; Future  2. Dorsalgia of thoracic region Reviewed results of x-ray of thoracic spine. Showed mild scoliosis and degenerative disc disease. Offered referral to orthopedics for further evaluation and she wishes to wait on this for now.   3. Generalized anxiety disorder May continue to take alprazolam 0.5mg  twice daily as needed for acute anxieety.  - ALPRAZolam (XANAX) 0.5 MG tablet; TAKE 1 TABLET TWICE A DAY AS NEEDED FOR ANXIETY  Dispense: 60 tablet; Refill: 3  4.  Essential (primary) hypertension Generally stable. Continue bp medication as prescribed   General Counseling: Sandy Salaam understanding of the findings of todays visit and agrees with plan of treatment. I have discussed any further diagnostic evaluation that may be needed or ordered today. We also reviewed her medications today. she has been encouraged to call the office with any questions or concerns that should arise related to todays visit.    Counseling:  This patient was seen by Leretha Pol FNP Collaboration with Dr Lavera Guise as a part of collaborative care agreement  Orders Placed This Encounter  Procedures  . VAS [place] LOWER EXTREMITY VENOUS REFLUX    Meds ordered this encounter  Medications  . ALPRAZolam (XANAX) 0.5 MG tablet    Sig: TAKE 1 TABLET TWICE A DAY AS NEEDED FOR ANXIETY    Dispense:  60 tablet    Refill:  3    Order Specific Question:   Supervising Provider    Answer:   Lavera Guise [id number]    Time spent: 30 Minutes

## 2020-01-08 NOTE — Progress Notes (Signed)
Discussed with patient at most recent visit. Will hold off on referral to orthopedics.

## 2020-01-09 ENCOUNTER — Other Ambulatory Visit: Payer: Self-pay

## 2020-01-09 DIAGNOSIS — E1142 Type 2 diabetes mellitus with diabetic polyneuropathy: Secondary | ICD-10-CM

## 2020-01-09 MED ORDER — GABAPENTIN 100 MG PO CAPS: ORAL_CAPSULE | ORAL | 3 refills | Status: DC

## 2020-01-14 DIAGNOSIS — I83893 Varicose veins of bilateral lower extremities with other complications: Secondary | ICD-10-CM | POA: Insufficient documentation

## 2020-01-17 ENCOUNTER — Telehealth: Payer: Self-pay

## 2020-01-17 NOTE — Telephone Encounter (Signed)
Pt called that she is having pain her right arm on injection site no rash as per heather advised to used ice pack and tylenol and ibuprofen

## 2020-01-26 ENCOUNTER — Telehealth: Payer: Self-pay

## 2020-01-26 NOTE — Telephone Encounter (Signed)
CONFIRMED PATIENT ULTRASOUND APPT FOR 01/27/20

## 2020-01-27 ENCOUNTER — Ambulatory Visit: Payer: No Typology Code available for payment source

## 2020-01-27 ENCOUNTER — Other Ambulatory Visit: Payer: Self-pay

## 2020-01-27 DIAGNOSIS — I83893 Varicose veins of bilateral lower extremities with other complications: Secondary | ICD-10-CM

## 2020-01-31 ENCOUNTER — Other Ambulatory Visit: Payer: Self-pay

## 2020-01-31 MED ORDER — CYCLOBENZAPRINE HCL 5 MG PO TABS: 5.0000 mg | ORAL_TABLET | Freq: Every evening | ORAL | 3 refills | Status: DC | PRN

## 2020-02-08 ENCOUNTER — Other Ambulatory Visit: Payer: Self-pay

## 2020-02-08 DIAGNOSIS — E1165 Type 2 diabetes mellitus with hyperglycemia: Secondary | ICD-10-CM

## 2020-02-08 MED ORDER — ROSUVASTATIN CALCIUM 5 MG PO TABS: 5.0000 mg | ORAL_TABLET | Freq: Every day | ORAL | 3 refills | Status: DC

## 2020-02-09 ENCOUNTER — Ambulatory Visit: Payer: 59 | Admitting: Nurse Practitioner

## 2020-02-17 ENCOUNTER — Other Ambulatory Visit: Payer: Self-pay

## 2020-02-17 DIAGNOSIS — M546 Pain in thoracic spine: Secondary | ICD-10-CM

## 2020-02-17 MED ORDER — TIZANIDINE HCL 2 MG PO TABS: 2.0000 mg | ORAL_TABLET | Freq: Two times a day (BID) | ORAL | 3 refills | Status: DC | PRN

## 2020-02-28 ENCOUNTER — Telehealth: Payer: Self-pay

## 2020-02-28 NOTE — Telephone Encounter (Signed)
Confirmed and screened for 03-01-20 ov.

## 2020-03-01 ENCOUNTER — Other Ambulatory Visit: Payer: Self-pay

## 2020-03-01 ENCOUNTER — Ambulatory Visit: Payer: No Typology Code available for payment source | Admitting: Nurse Practitioner

## 2020-03-01 ENCOUNTER — Encounter: Payer: Self-pay | Admitting: Nurse Practitioner

## 2020-03-01 VITALS — BP 151/83 | HR 82 | Temp 96.1°F | Resp 16 | Ht 68.0 in | Wt 239.8 lb

## 2020-03-01 DIAGNOSIS — M25511 Pain in right shoulder: Secondary | ICD-10-CM | POA: Diagnosis not present

## 2020-03-01 DIAGNOSIS — N959 Unspecified menopausal and perimenopausal disorder: Secondary | ICD-10-CM

## 2020-03-01 DIAGNOSIS — M546 Pain in thoracic spine: Secondary | ICD-10-CM | POA: Diagnosis not present

## 2020-03-01 DIAGNOSIS — E1165 Type 2 diabetes mellitus with hyperglycemia: Secondary | ICD-10-CM | POA: Diagnosis not present

## 2020-03-01 DIAGNOSIS — K219 Gastro-esophageal reflux disease without esophagitis: Secondary | ICD-10-CM

## 2020-03-01 DIAGNOSIS — M5000 Cervical disc disorder with myelopathy, unspecified cervical region: Secondary | ICD-10-CM

## 2020-03-01 DIAGNOSIS — I83893 Varicose veins of bilateral lower extremities with other complications: Secondary | ICD-10-CM

## 2020-03-01 LAB — POCT GLYCOSYLATED HEMOGLOBIN (HGB A1C): Hemoglobin A1C: 7.5 % — AB (ref 4.0–5.6)

## 2020-03-01 MED ORDER — IBUPROFEN 600 MG PO TABS: 600.0000 mg | ORAL_TABLET | Freq: Three times a day (TID) | ORAL | 2 refills | Status: DC | PRN

## 2020-03-01 MED ORDER — PANTOPRAZOLE SODIUM 40 MG PO TBEC: 40.0000 mg | DELAYED_RELEASE_TABLET | Freq: Every day | ORAL | 3 refills | Status: DC

## 2020-03-01 MED ORDER — MELOXICAM 7.5 MG PO TABS: 7.5000 mg | ORAL_TABLET | Freq: Every day | ORAL | 3 refills | Status: DC

## 2020-03-01 MED ORDER — METFORMIN HCL 500 MG PO TABS: 500.0000 mg | ORAL_TABLET | Freq: Two times a day (BID) | ORAL | 3 refills | Status: DC

## 2020-03-01 MED ORDER — METHYLPREDNISOLONE 4 MG PO TBPK: ORAL_TABLET | ORAL | 0 refills | Status: DC

## 2020-03-01 MED ORDER — METOCLOPRAMIDE HCL 10 MG PO TABS: ORAL_TABLET | ORAL | 0 refills | Status: DC

## 2020-03-01 MED ORDER — ESTROGENS CONJUGATED 0.9 MG PO TABS: ORAL_TABLET | ORAL | 2 refills | Status: DC

## 2020-03-01 NOTE — Progress Notes (Signed)
Venous reflux noted bilaterally. Reviewed with patient during visit 03/01/2020

## 2020-03-01 NOTE — Progress Notes (Signed)
[facility] [place], [place] [address]  Internal MEDICINE  Office Visit Note  Patient Name: Valerie Ramos  [id number]  [id number]  Date of Service: 03/07/2020  Chief Complaint  Patient presents with  . Follow-up    review xray and ultrasound   . Diabetes  . Hypertension  . Arm Pain    right arm started hurting after getting coivd injection in april. any movement aches and throbs all day     The patient is here for routine follow up. She states that her right upper arm continues to hurt since she had second Moderna COVID 19 vaccine on 12/31/2019. She states that the right arm hurts, throbs, all the time. States that the entire upper arm hurts, going into her shoulder. She can't sleep on her right side, it throbs so bad, she has been taking tylenol and ibuprofen to help the pain, but these have not helped at all. She states that she has had no problems with her arm prior to the vaccine.  She is complaining of lower leg pain and swelling of her ankles and feet. This has been going on for a while. Gets worse when she is on her feet for a while and gets a little better with rest. She had lower extremity ultrasound. This was negative for DVT, veins are patent. She does have moderate venous reflux in both femoral veins, bilaterally.  She does not wea compression socks at this tiime.  Blood sugars up some from last visit. HgbA1c 7.5 today, up from 7.0 at past few checks.       Current Medication: Outpatient Encounter Medications as of 03/01/2020  Medication Sig  . acetaminophen (TYLENOL) 325 MG tablet Take 2 tablets (650 mg total) by mouth every 6 (six) hours as needed for mild pain (or Fever >/= 101).  Marland Kitchen ALPRAZolam (XANAX) 0.5 MG tablet TAKE 1 TABLET TWICE A DAY AS NEEDED FOR ANXIETY  . cyclobenzaprine (FLEXERIL) 5 MG tablet Take 1 tablet (5 mg total) by mouth at bedtime as needed.  . dicyclomine (BENTYL) 10 MG capsule Take 1 capsule (10 mg total) by mouth 3 (three) times  daily before meals.  . DULoxetine (CYMBALTA) 30 MG capsule Take 30 mg by mouth daily.  Marland Kitchen estrogens, conjugated, (PREMARIN) 0.9 MG tablet 1 TABLET, COATED BY MOUTH ONCE A DAY  . gabapentin (NEURONTIN) 100 MG capsule Take 1 capsule po every evening. May increase to twice daily as needed and as tolerated .  . glucose blood (ONE TOUCH ULTRA TEST) test strip FOR ONCE DAILY TESTING DX. E11.65  . hydrochlorothiazide (HYDRODIURIL) 25 MG tablet TAKE 1 TABLET(S) BY MOUTH DAILY FOR BLOOD PRESSURE/EDEMA  . losartan (COZAAR) 25 MG tablet Take 1 tablet (25 mg total) by mouth daily.  . meloxicam (MOBIC) 7.5 MG tablet Take 1 tablet (7.5 mg total) by mouth daily.  . metFORMIN (GLUCOPHAGE) 500 MG tablet Take 1 tablet (500 mg total) by mouth 2 (two) times daily with a meal.  . metoCLOPramide (REGLAN) 10 MG tablet TAKE 1 TABLET THREE TIMES A DAY AS NEEDED FOR REFLUX  . OneTouch Delica Lancets [id number] MISC 1 each by Does not apply route daily. Use as directed once daily diag e11.65  . pantoprazole (PROTONIX) 40 MG tablet Take 1 tablet (40 mg total) by mouth daily.  . phenazopyridine (PYRIDIUM) 200 MG tablet Take 1 tablet (200 mg total) by mouth 3 (three) times daily as needed for pain.  . rosuvastatin (CRESTOR) 5 MG tablet Take  1 tablet (5 mg total) by mouth daily.  Marland Kitchen tiZANidine (ZANAFLEX) 2 MG tablet Take 1 tablet (2 mg total) by mouth 2 (two) times daily as needed for muscle spasms.  . traMADol (ULTRAM) 50 MG tablet TAKE 1 TO 2 TABLETS BY MOUTH 3 TIMES A DAY AS NEEDED FOR PAIN  . zolpidem (AMBIEN) 10 MG tablet Take 1 tablet (10 mg total) by mouth at bedtime as needed for sleep.  . [DISCONTINUED] estrogens, conjugated, (PREMARIN) 0.9 MG tablet 1 TABLET, COATED BY MOUTH ONCE A DAY  . [DISCONTINUED] meloxicam (MOBIC) 7.5 MG tablet Take 1 tablet (7.5 mg total) by mouth daily.  . [DISCONTINUED] metFORMIN (GLUCOPHAGE) 500 MG tablet Take 0.5 tablets (250 mg total) by mouth 2 (two) times daily with a meal.  . [DISCONTINUED]  metoCLOPramide (REGLAN) 10 MG tablet TAKE 1 TABLET THREE TIMES A DAY AS NEEDED FOR REFLUX  . famotidine (PEPCID) 20 MG tablet Take 1 tablet (20 mg total) by mouth 2 (two) times daily.  Marland Kitchen ibuprofen (ADVIL) 600 MG tablet Take 1 tablet (600 mg total) by mouth every 8 (eight) hours as needed.  . methylPREDNISolone (MEDROL) 4 MG TBPK tablet Take by mouth as directed for 6 days   No facility-administered encounter medications on file as of 03/01/2020.    Surgical History: Past Surgical History:  Procedure Laterality Date  . ABDOMINAL HYSTERECTOMY    . CESAREAN SECTION  1989  . CYSTECTOMY  2000   Ovarian  . OOPHORECTOMY    . OVARIAN CYST SURGERY  2006  . RECTOVAGINAL FISTULA CLOSURE  1980    Medical History: Past Medical History:  Diagnosis Date  . Anxiety   . Arthritis   . Depression   . Diabetes 1.5, managed as type 2 ([place])   . Hypertension   . Insomnia   . Migraines   . Reflux   . Sinusitis     Family History: Family History  Problem Relation Age of Onset  . Hodgkin's lymphoma Mother   . Diabetes Mother   . Thyroid disease Mother   . Glaucoma Mother   . Stroke Brother   . Heart disease Brother     Social History   Socioeconomic History  . Marital status: Married    Spouse name: Not on file  . Number of children: Not on file  . Years of education: Not on file  . Highest education level: Not on file  Occupational History  . Not on file  Tobacco Use  . Smoking status: Former Smoker    Types: Cigarettes    Quit date: 05/24/2011    Years since quitting: 8.7  . Smokeless tobacco: Never Used  Substance and Sexual Activity  . Alcohol use: No  . Drug use: No  . Sexual activity: Not on file  Other Topics Concern  . Not on file  Social History Narrative  . Not on file   Social Determinants of Health   Financial Resource Strain:   . Difficulty of Paying Living Expenses:   Food Insecurity:   . Worried About Charity fundraiser in the Last Year:   . Academic librarian in the Last Year:   Transportation Needs:   . Film/video editor (Medical):   Marland Kitchen Lack of Transportation (Non-Medical):   Physical Activity:   . Days of Exercise per Week:   . Minutes of Exercise per Session:   Stress:   . Feeling of Stress :   Social Connections:   . Frequency  of Communication with Friends and Family:   . Frequency of Social Gatherings with Friends and Family:   . Attends Religious Services:   . Active Member of Clubs or Organizations:   . Attends Archivist Meetings:   Marland Kitchen Marital Status:   Intimate Partner Violence:   . Fear of Current or Ex-Partner:   . Emotionally Abused:   Marland Kitchen Physically Abused:   . Sexually Abused:       Review of Systems  Constitutional: Positive for fatigue. Negative for chills and unexpected weight change.  HENT: Negative for congestion, postnasal drip, rhinorrhea, sneezing and sore throat.   Respiratory: Negative for cough, chest tightness, shortness of breath and wheezing.   Cardiovascular: Positive for leg swelling. Negative for chest pain and palpitations.  Gastrointestinal: Negative for abdominal pain, constipation, diarrhea, nausea and vomiting.  Endocrine: Negative for cold intolerance, heat intolerance, polydipsia and polyuria.       Elevated blood sugars recently.   Musculoskeletal: Positive for back pain. Negative for arthralgias, joint swelling and neck pain.       Right arm pain nearly all of the time since getting COVID 19 vaccine.   Skin: Negative for rash.  Allergic/Immunologic: Negative for environmental allergies.  Neurological: Positive for numbness. Negative for tremors.       Burning sensation in both feet. This has improved some since starting on gabapentin 100mg  at bedtime.   Hematological: Negative for adenopathy. Does not bruise/bleed easily.  Psychiatric/Behavioral: Negative for behavioral problems (Depression), sleep disturbance and suicidal ideas. The patient is nervous/anxious.     Today's  Vitals   03/01/20 1347  BP: (!) 151/83  Pulse: 82  Resp: 16  Temp: (!) 96.1 F (35.6 C)  SpO2: 95%  Weight: 239 lb 12.8 oz (108.8 kg)  Height: 5\' 8"  (1.727 m)   Body mass index is 36.46 kg/m.   Physical Exam Vitals and nursing note reviewed.  Constitutional:      General: She is not in acute distress.    Appearance: Normal appearance. She is well-developed. She is obese. She is not diaphoretic.  HENT:     Head: Normocephalic and atraumatic.     Nose: Nose normal.     Mouth/Throat:     Pharynx: No oropharyngeal exudate.  Eyes:     Extraocular Movements: Extraocular movements intact.     Pupils: Pupils are equal, round, and reactive to light.  Neck:     Thyroid: No thyromegaly.     Vascular: No carotid bruit or JVD.     Trachea: No tracheal deviation.  Cardiovascular:     Rate and Rhythm: Normal rate and regular rhythm.     Heart sounds: Normal heart sounds. No murmur. No friction rub. No gallop.      Comments: There is currently mild swelling in both lower legs and both ankles and feet. It is non pitting. There is mild tenderness with palpation.  Pulmonary:     Effort: Pulmonary effort is normal. No respiratory distress.     Breath sounds: Normal breath sounds. No wheezing or rales.  Chest:     Chest wall: No tenderness.  Abdominal:     Palpations: Abdomen is soft.  Musculoskeletal:        General: Tenderness present. Normal range of motion.     Cervical back: Normal range of motion and neck supple.     Comments: The right upper arm is tender to palpation. ROM and strength are intact but movement does increase pain. No visible  or palpable abnormalities are noted at this time.   Lymphadenopathy:     Cervical: No cervical adenopathy.  Skin:    General: Skin is warm and dry.  Neurological:     Mental Status: She is alert and oriented to person, place, and time.     Cranial Nerves: No cranial nerve deficit.  Psychiatric:        Attention and Perception: Attention  normal.        Mood and Affect: Mood is anxious and depressed.        Speech: Speech normal.        Behavior: Behavior normal. Behavior is cooperative.        Thought Content: Thought content normal.        Cognition and Memory: Cognition and memory normal.        Judgment: Judgment normal.    Assessment/Plan: 1. Type 2 diabetes mellitus with hyperglycemia, unspecified whether long term insulin use (HCC) - POCT HgB A1C 7.6 up from 7.0 at most recent check. Increase metformin to 500mg  twice daily. Continue to monitor blood sugars closely.  - metFORMIN (GLUCOPHAGE) 500 MG tablet; Take 1 tablet (500 mg total) by mouth 2 (two) times daily with a meal.  Dispense: 180 tablet; Refill: 3  2. Varicose veins of leg with edema, bilateral Reviewed lower extremity ultrasound with the patient. Negative for DVT, veins are all patent. She does have moderate varicose veins with venous reflux. Will have her start wearing compression socks or stockings everyday while awake and on her feet.   3. Acute pain of right shoulder Pain is at injection site of COVID 19 vaccine. Add ibuprofen 800mg  up to three times daily as needed for pain/inflammation. Start medrol dose pack. Take as directed for 6 days. Advised her to monitor her blood sugars closely while on this medication as sugars may elevate. She voiced understanding.  - methylPREDNISolone (MEDROL) 4 MG TBPK tablet; Take by mouth as directed for 6 days  Dispense: 21 tablet; Refill: 0 - ibuprofen (ADVIL) 600 MG tablet; Take 1 tablet (600 mg total) by mouth every 8 (eight) hours as needed.  Dispense: 90 tablet; Refill: 2  4. Dorsalgia of thoracic region May alternate between ibuprofen and meloxicam as needed for joint and bone pain/inflammaiton. She should not take both in same 24 hour period. She did voice understanding of this advice.  - meloxicam (MOBIC) 7.5 MG tablet; Take 1 tablet (7.5 mg total) by mouth daily.  Dispense: 30 tablet; Refill: 3  5. Cervical  disc disease with myelopathy May alternate between ibuprofen and meloxicam as needed for joint and bone pain/inflammaiton. She should not take both in same 24 hour period. She did voice understanding of this advice.  - ibuprofen (ADVIL) 600 MG tablet; Take 1 tablet (600 mg total) by mouth every 8 (eight) hours as needed.  Dispense: 90 tablet; Refill: 2  6. Unspecified menopausal and perimenopausal disorder May continue to take premarin 0.5mg  tablet daily.  - estrogens, conjugated, (PREMARIN) 0.9 MG tablet; 1 TABLET, COATED BY MOUTH ONCE A DAY  Dispense: 90 tablet; Refill: 2  7. Gastro-esophageal reflux disease without esophagitis May take reglan 10mg  up to three times daily as needed. Take only when needed.  - metoCLOPramide (REGLAN) 10 MG tablet; TAKE 1 TABLET THREE TIMES A DAY AS NEEDED FOR REFLUX  Dispense: 270 tablet; Refill: 0  General Counseling: Valerie Ramos verbalizes understanding of the findings of todays visit and agrees with plan of treatment. I have discussed any further  diagnostic evaluation that may be needed or ordered today. We also reviewed her medications today. she has been encouraged to call the office with any questions or concerns that should arise related to todays visit.  Diabetes Counseling:  1. Addition of ACE inh/ ARB'S for nephroprotection. Microalbumin is updated  2. Diabetic foot care, prevention of complications. Podiatry consult 3. Exercise and lose weight.  4. Diabetic eye examination, Diabetic eye exam is updated  5. Monitor blood sugar closlely. nutrition counseling.  6. Sign and symptoms of hypoglycemia including shaking sweating,confusion and headaches.  This patient was seen by Leretha Pol FNP Collaboration with Dr Lavera Guise as a part of collaborative care agreement  Orders Placed This Encounter  Procedures  . POCT HgB A1C    Meds ordered this encounter  Medications  . metFORMIN (GLUCOPHAGE) 500 MG tablet    Sig: Take 1 tablet (500 mg total) by  mouth 2 (two) times daily with a meal.    Dispense:  180 tablet    Refill:  3    Please note increased dose    Order Specific Question:   Supervising Provider    Answer:   Lavera Guise [place]  . methylPREDNISolone (MEDROL) 4 MG TBPK tablet    Sig: Take by mouth as directed for 6 days    Dispense:  21 tablet    Refill:  0    Order Specific Question:   Supervising Provider    Answer:   Lavera Guise [place]  . estrogens, conjugated, (PREMARIN) 0.9 MG tablet    Sig: 1 TABLET, COATED BY MOUTH ONCE A DAY    Dispense:  90 tablet    Refill:  2    Please change to alternative preferred by patient's insurance so not so expensive.    Order Specific Question:   Supervising Provider    Answer:   Lavera Guise [id number]  . meloxicam (MOBIC) 7.5 MG tablet    Sig: Take 1 tablet (7.5 mg total) by mouth daily.    Dispense:  30 tablet    Refill:  3    Order Specific Question:   Supervising Provider    Answer:   Lavera Guise [id number]  . metoCLOPramide (REGLAN) 10 MG tablet    Sig: TAKE 1 TABLET THREE TIMES A DAY AS NEEDED FOR REFLUX    Dispense:  270 tablet    Refill:  0    Order Specific Question:   Supervising Provider    Answer:   Lavera Guise [id number]  . ibuprofen (ADVIL) 600 MG tablet    Sig: Take 1 tablet (600 mg total) by mouth every 8 (eight) hours as needed.    Dispense:  90 tablet    Refill:  2    Order Specific Question:   Supervising Provider    Answer:   Lavera Guise [id number]    Total time spent: 40 Minutes   Time spent includes review of chart, medications, test results, and follow up plan with the patient.      Dr Lavera Guise Internal medicine

## 2020-03-14 ENCOUNTER — Other Ambulatory Visit: Payer: Self-pay

## 2020-03-14 DIAGNOSIS — I10 Essential (primary) hypertension: Secondary | ICD-10-CM

## 2020-03-14 MED ORDER — HYDROCHLOROTHIAZIDE 25 MG PO TABS: ORAL_TABLET | ORAL | 3 refills | Status: DC

## 2020-03-30 ENCOUNTER — Other Ambulatory Visit: Payer: Self-pay

## 2020-03-30 DIAGNOSIS — F5101 Primary insomnia: Secondary | ICD-10-CM

## 2020-03-30 MED ORDER — ZOLPIDEM TARTRATE 10 MG PO TABS: 10.0000 mg | ORAL_TABLET | Freq: Every evening | ORAL | 3 refills | Status: DC | PRN

## 2020-04-03 ENCOUNTER — Telehealth: Payer: Self-pay

## 2020-04-03 NOTE — Telephone Encounter (Signed)
Authorization approved for Zolpidem from 03/31/2020 through 04/01/2023 [facility]

## 2020-04-17 ENCOUNTER — Other Ambulatory Visit: Payer: Self-pay

## 2020-04-17 DIAGNOSIS — I10 Essential (primary) hypertension: Secondary | ICD-10-CM

## 2020-04-17 MED ORDER — LOSARTAN POTASSIUM 25 MG PO TABS: 25.0000 mg | ORAL_TABLET | Freq: Every day | ORAL | 3 refills | Status: DC

## 2020-05-07 ENCOUNTER — Other Ambulatory Visit: Payer: Self-pay

## 2020-05-07 DIAGNOSIS — E1165 Type 2 diabetes mellitus with hyperglycemia: Secondary | ICD-10-CM

## 2020-05-07 MED ORDER — ROSUVASTATIN CALCIUM 5 MG PO TABS: 5.0000 mg | ORAL_TABLET | Freq: Every day | ORAL | 3 refills | Status: DC

## 2020-05-28 ENCOUNTER — Other Ambulatory Visit: Payer: Self-pay

## 2020-05-28 DIAGNOSIS — M25511 Pain in right shoulder: Secondary | ICD-10-CM

## 2020-05-28 DIAGNOSIS — M5000 Cervical disc disorder with myelopathy, unspecified cervical region: Secondary | ICD-10-CM

## 2020-05-28 DIAGNOSIS — E1142 Type 2 diabetes mellitus with diabetic polyneuropathy: Secondary | ICD-10-CM

## 2020-05-28 MED ORDER — GABAPENTIN 100 MG PO CAPS: ORAL_CAPSULE | ORAL | 3 refills | Status: DC

## 2020-05-28 MED ORDER — CYCLOBENZAPRINE HCL 5 MG PO TABS: 5.0000 mg | ORAL_TABLET | Freq: Every evening | ORAL | 3 refills | Status: DC | PRN

## 2020-05-28 MED ORDER — IBUPROFEN 600 MG PO TABS: 600.0000 mg | ORAL_TABLET | Freq: Three times a day (TID) | ORAL | 2 refills | Status: DC | PRN

## 2020-06-05 ENCOUNTER — Telehealth: Payer: Self-pay

## 2020-06-05 NOTE — Telephone Encounter (Signed)
Confirmed & screened for OV on 9/9

## 2020-06-07 ENCOUNTER — Encounter: Payer: Self-pay | Admitting: Nurse Practitioner

## 2020-06-07 ENCOUNTER — Other Ambulatory Visit: Payer: Self-pay

## 2020-06-07 ENCOUNTER — Ambulatory Visit: Payer: No Typology Code available for payment source | Admitting: Nurse Practitioner

## 2020-06-07 VITALS — BP 140/86 | HR 88 | Temp 98.2°F | Resp 16 | Ht 68.0 in | Wt 239.0 lb

## 2020-06-07 DIAGNOSIS — E1142 Type 2 diabetes mellitus with diabetic polyneuropathy: Secondary | ICD-10-CM

## 2020-06-07 DIAGNOSIS — F411 Generalized anxiety disorder: Secondary | ICD-10-CM

## 2020-06-07 DIAGNOSIS — E1165 Type 2 diabetes mellitus with hyperglycemia: Secondary | ICD-10-CM

## 2020-06-07 DIAGNOSIS — I10 Essential (primary) hypertension: Secondary | ICD-10-CM

## 2020-06-07 LAB — POCT GLYCOSYLATED HEMOGLOBIN (HGB A1C): Hemoglobin A1C: 7.1 % — AB (ref 4.0–5.6)

## 2020-06-07 MED ORDER — ALPRAZOLAM 0.5 MG PO TABS: ORAL_TABLET | ORAL | 3 refills | Status: DC

## 2020-06-07 NOTE — Progress Notes (Signed)
[facility] [place], [place] [address]  Internal MEDICINE  Office Visit Note  Patient Name: Valerie Ramos  [id number]  [id number]  Date of Service: 06/24/2020  Chief Complaint  Patient presents with  . Follow-up    , refill request  . Hypertension  . Diabetes  . Depression  . Quality Metric Gaps    TDAP    The patient is here for routine follow up. Blood sugars are improving. Her HgbA1c is 7.1 down from 7.5 at her most recent check. She will be due to have routine, fasting labs prior to her next visit. Her blood pressure is well managed with current medication. She does suffer from intermittent anxiety. She currently takes cymbalta 30mg  daily. She does take alprazolam 0.5mg  up to twice daily if needed for acute anxiety. She generally takes this only once a day, but will sometimes take the second dose if needed. She does need to have refills for this today. She has no new concerns or complaints today.       Current Medication: Outpatient Encounter Medications as of 06/07/2020  Medication Sig  . acetaminophen (TYLENOL) 325 MG tablet Take 2 tablets (650 mg total) by mouth every 6 (six) hours as needed for mild pain (or Fever >/= 101).  Marland Kitchen ALPRAZolam (XANAX) 0.5 MG tablet TAKE 1 TABLET TWICE A DAY AS NEEDED FOR ANXIETY  . cyclobenzaprine (FLEXERIL) 5 MG tablet Take 1 tablet (5 mg total) by mouth at bedtime as needed.  Marland Kitchen estrogens, conjugated, (PREMARIN) 0.9 MG tablet 1 TABLET, COATED BY MOUTH ONCE A DAY  . gabapentin (NEURONTIN) 100 MG capsule Take 1 capsule po every evening. May increase to twice daily as needed and as tolerated .  . glucose blood (ONE TOUCH ULTRA TEST) test strip FOR ONCE DAILY TESTING DX. E11.65  . hydrochlorothiazide (HYDRODIURIL) 25 MG tablet TAKE 1 TABLET(S) BY MOUTH DAILY FOR BLOOD PRESSURE/EDEMA  . ibuprofen (ADVIL) 600 MG tablet Take 1 tablet (600 mg total) by mouth every 8 (eight) hours as needed.  Marland Kitchen losartan (COZAAR) 25 MG tablet Take  1 tablet (25 mg total) by mouth daily.  . meloxicam (MOBIC) 7.5 MG tablet Take 1 tablet (7.5 mg total) by mouth daily.  . metFORMIN (GLUCOPHAGE) 500 MG tablet Take 1 tablet (500 mg total) by mouth 2 (two) times daily with a meal.  . metoCLOPramide (REGLAN) 10 MG tablet TAKE 1 TABLET THREE TIMES A DAY AS NEEDED FOR REFLUX  . OneTouch Delica Lancets 53G MISC 1 each by Does not apply route daily. Use as directed once daily diag e11.65  . pantoprazole (PROTONIX) 40 MG tablet Take 1 tablet (40 mg total) by mouth daily.  . rosuvastatin (CRESTOR) 5 MG tablet Take 1 tablet (5 mg total) by mouth daily.  Marland Kitchen tiZANidine (ZANAFLEX) 2 MG tablet Take 1 tablet (2 mg total) by mouth 2 (two) times daily as needed for muscle spasms.  Marland Kitchen zolpidem (AMBIEN) 10 MG tablet Take 1 tablet (10 mg total) by mouth at bedtime as needed for sleep.  . [DISCONTINUED] ALPRAZolam (XANAX) 0.5 MG tablet TAKE 1 TABLET TWICE A DAY AS NEEDED FOR ANXIETY  . dicyclomine (BENTYL) 10 MG capsule Take 1 capsule (10 mg total) by mouth 3 (three) times daily before meals. (Patient not taking: Reported on 06/07/2020)  . DULoxetine (CYMBALTA) 30 MG capsule Take 30 mg by mouth daily. (Patient not taking: Reported on 06/07/2020)  . famotidine (PEPCID) 20 MG tablet Take 1 tablet (20 mg total) by  mouth 2 (two) times daily.  . methylPREDNISolone (MEDROL) 4 MG TBPK tablet Take by mouth as directed for 6 days (Patient not taking: Reported on 06/07/2020)  . phenazopyridine (PYRIDIUM) 200 MG tablet Take 1 tablet (200 mg total) by mouth 3 (three) times daily as needed for pain. (Patient not taking: Reported on 06/07/2020)  . traMADol (ULTRAM) 50 MG tablet TAKE 1 TO 2 TABLETS BY MOUTH 3 TIMES A DAY AS NEEDED FOR PAIN (Patient not taking: Reported on 06/07/2020)   No facility-administered encounter medications on file as of 06/07/2020.    Surgical History: Past Surgical History:  Procedure Laterality Date  . ABDOMINAL HYSTERECTOMY    . CESAREAN SECTION  1989  .  CYSTECTOMY  2000   Ovarian  . OOPHORECTOMY    . OVARIAN CYST SURGERY  2006  . RECTOVAGINAL FISTULA CLOSURE  1980    Medical History: Past Medical History:  Diagnosis Date  . Anxiety   . Arthritis   . Depression   . Diabetes 1.5, managed as type 2 (Rule)   . Hypertension   . Insomnia   . Migraines   . Reflux   . Sinusitis     Family History: Family History  Problem Relation Age of Onset  . Hodgkin's lymphoma Mother   . Diabetes Mother   . Thyroid disease Mother   . Glaucoma Mother   . Stroke Brother   . Heart disease Brother     Social History   Socioeconomic History  . Marital status: Married    Spouse name: Not on file  . Number of children: Not on file  . Years of education: Not on file  . Highest education level: Not on file  Occupational History  . Not on file  Tobacco Use  . Smoking status: Former Smoker    Types: Cigarettes    Quit date: 05/24/2011    Years since quitting: 9.0  . Smokeless tobacco: Never Used  Substance and Sexual Activity  . Alcohol use: No  . Drug use: No  . Sexual activity: Not on file  Other Topics Concern  . Not on file  Social History Narrative  . Not on file   Social Determinants of Health   Financial Resource Strain:   . Difficulty of Paying Living Expenses: Not on file  Food Insecurity:   . Worried About Charity fundraiser in the Last Year: Not on file  . Ran Out of Food in the Last Year: Not on file  Transportation Needs:   . Lack of Transportation (Medical): Not on file  . Lack of Transportation (Non-Medical): Not on file  Physical Activity:   . Days of Exercise per Week: Not on file  . Minutes of Exercise per Session: Not on file  Stress:   . Feeling of Stress : Not on file  Social Connections:   . Frequency of Communication with Friends and Family: Not on file  . Frequency of Social Gatherings with Friends and Family: Not on file  . Attends Religious Services: Not on file  . Active Member of Clubs or  Organizations: Not on file  . Attends Archivist Meetings: Not on file  . Marital Status: Not on file  Intimate Partner Violence:   . Fear of Current or Ex-Partner: Not on file  . Emotionally Abused: Not on file  . Physically Abused: Not on file  . Sexually Abused: Not on file      Review of Systems  Constitutional: Positive for fatigue.  Negative for chills and unexpected weight change.  HENT: Negative for congestion, postnasal drip, rhinorrhea, sneezing and sore throat.   Respiratory: Negative for cough, chest tightness, shortness of breath and wheezing.   Cardiovascular: Positive for leg swelling. Negative for chest pain and palpitations.  Gastrointestinal: Negative for abdominal pain, constipation, diarrhea, nausea and vomiting.  Endocrine: Negative for cold intolerance, heat intolerance, polydipsia and polyuria.       Improved blood sugars   Musculoskeletal: Positive for back pain. Negative for arthralgias, joint swelling and neck pain.       Improved right arm pain  Skin: Negative for rash.  Allergic/Immunologic: Negative for environmental allergies.  Neurological: Negative for tremors and numbness.       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   Hematological: Negative for adenopathy. Does not bruise/bleed easily.  Psychiatric/Behavioral: Positive for sleep disturbance. Negative for behavioral problems (Depression) and suicidal ideas. The patient is nervous/anxious.     Today's Vitals   06/07/20 1118  BP: 140/86  Pulse: 88  Resp: 16  Temp: 98.2 F (36.8 C)  SpO2: 96%  Height: 5\' 8"  (1.727 m)   Body mass index is 36.46 kg/m.  Physical Exam Vitals and nursing note reviewed.  Constitutional:      General: She is not in acute distress.    Appearance: Normal appearance. She is well-developed. She is obese. She is not diaphoretic.  HENT:     Head: Normocephalic and atraumatic.     Nose: Nose normal.      Mouth/Throat:     Pharynx: No oropharyngeal exudate.  Eyes:     Pupils: Pupils are equal, round, and reactive to light.  Neck:     Thyroid: No thyromegaly.     Vascular: No carotid bruit or JVD.     Trachea: No tracheal deviation.  Cardiovascular:     Rate and Rhythm: Normal rate and regular rhythm.     Heart sounds: Normal heart sounds. No murmur heard.  No friction rub. No gallop.   Pulmonary:     Effort: Pulmonary effort is normal. No respiratory distress.     Breath sounds: Normal breath sounds. No wheezing or rales.  Chest:     Chest wall: No tenderness.  Abdominal:     Palpations: Abdomen is soft.  Musculoskeletal:        General: Normal range of motion.     Cervical back: Normal range of motion and neck supple.  Lymphadenopathy:     Cervical: No cervical adenopathy.  Skin:    General: Skin is warm and dry.  Neurological:     Mental Status: She is alert and oriented to person, place, and time.     Cranial Nerves: No cranial nerve deficit.  Psychiatric:        Attention and Perception: Attention and perception normal.        Mood and Affect: Affect normal. Mood is anxious.        Speech: Speech normal.        Behavior: Behavior normal. Behavior is cooperative.        Thought Content: Thought content normal.        Cognition and Memory: Cognition and memory normal.        Judgment: Judgment normal.   Assessment/Plan: 1. Type 2 diabetes mellitus with hyperglycemia, without long-term current use of insulin (HCC) - POCT HgB A1C 7.0 today. Continue on diabetic medication as prescribed. monitor blood sugars closely.   2.  Diabetic polyneuropathy associated with type 2 diabetes mellitus (HCC) Improved on current dose of gabapentin  3. Essential (primary) hypertension Stable. Continue bp medication as prescribed   4. Generalized anxiety disorder May take alprazolam 0.5mg  up to twice daily as needed for acute anxiety. A new prescription was sent to her pharmacy today.  -  ALPRAZolam (XANAX) 0.5 MG tablet; TAKE 1 TABLET TWICE A DAY AS NEEDED FOR ANXIETY  Dispense: 60 tablet; Refill: 3  General Counseling: Lenea verbalizes understanding of the findings of todays visit and agrees with plan of treatment. I have discussed any further diagnostic evaluation that may be needed or ordered today. We also reviewed her medications today. she has been encouraged to call the office with any questions or concerns that should arise related to todays visit.  Diabetes Counseling:  1. Addition of ACE inh/ ARB'S for nephroprotection. Microalbumin is updated  2. Diabetic foot care, prevention of complications. Podiatry consult 3. Exercise and lose weight.  4. Diabetic eye examination, Diabetic eye exam is updated  5. Monitor blood sugar closlely. nutrition counseling.  6. Sign and symptoms of hypoglycemia including shaking sweating,confusion and headaches.  This patient was seen by Leretha Pol FNP Collaboration with Dr Lavera Guise as a part of collaborative care agreement  Orders Placed This Encounter  Procedures  . POCT HgB A1C    Meds ordered this encounter  Medications  . ALPRAZolam (XANAX) 0.5 MG tablet    Sig: TAKE 1 TABLET TWICE A DAY AS NEEDED FOR ANXIETY    Dispense:  60 tablet    Refill:  3    Order Specific Question:   Supervising Provider    Answer:   Lavera Guise [[id number]]    Total time spent: 30 Minutes   Time spent includes review of chart, medications, test results, and follow up plan with the patient.      Dr Lavera Guise Internal medicine

## 2020-06-29 ENCOUNTER — Other Ambulatory Visit: Payer: Self-pay

## 2020-06-29 DIAGNOSIS — I10 Essential (primary) hypertension: Secondary | ICD-10-CM

## 2020-06-29 DIAGNOSIS — E1165 Type 2 diabetes mellitus with hyperglycemia: Secondary | ICD-10-CM

## 2020-06-29 MED ORDER — LOSARTAN POTASSIUM 25 MG PO TABS: 25.0000 mg | ORAL_TABLET | Freq: Every day | ORAL | 3 refills | Status: DC

## 2020-06-29 MED ORDER — ROSUVASTATIN CALCIUM 5 MG PO TABS: 5.0000 mg | ORAL_TABLET | Freq: Every day | ORAL | 3 refills | Status: DC

## 2020-07-02 ENCOUNTER — Telehealth: Payer: Self-pay

## 2020-07-02 NOTE — Telephone Encounter (Signed)
Per DFK I called the pt's pharmacy and spoke to pharmacist snjezana and asked for her to cancel the prescription for alprazolam that was given on 06/07/20.  Pt refilled medication on 06/22/20 per pharmacist.  Thurmond Butts will send in another prescription to pharmacy.  dbs

## 2020-07-25 ENCOUNTER — Other Ambulatory Visit: Payer: Self-pay

## 2020-07-25 DIAGNOSIS — M546 Pain in thoracic spine: Secondary | ICD-10-CM

## 2020-07-25 MED ORDER — MELOXICAM 7.5 MG PO TABS: 7.5000 mg | ORAL_TABLET | Freq: Every day | ORAL | 3 refills | Status: DC

## 2020-07-26 ENCOUNTER — Ambulatory Visit (INDEPENDENT_AMBULATORY_CARE_PROVIDER_SITE_OTHER): Payer: No Typology Code available for payment source | Admitting: Nurse Practitioner

## 2020-07-26 ENCOUNTER — Other Ambulatory Visit: Payer: Self-pay

## 2020-07-26 ENCOUNTER — Encounter: Payer: Self-pay | Admitting: Nurse Practitioner

## 2020-07-26 VITALS — BP 148/88 | HR 85 | Temp 98.0°F | Resp 16 | Ht 68.0 in | Wt 237.0 lb

## 2020-07-26 DIAGNOSIS — Z0001 Encounter for general adult medical examination with abnormal findings: Secondary | ICD-10-CM

## 2020-07-26 DIAGNOSIS — Z1231 Encounter for screening mammogram for malignant neoplasm of breast: Secondary | ICD-10-CM

## 2020-07-26 DIAGNOSIS — Z79899 Other long term (current) drug therapy: Secondary | ICD-10-CM

## 2020-07-26 DIAGNOSIS — E1165 Type 2 diabetes mellitus with hyperglycemia: Secondary | ICD-10-CM

## 2020-07-26 DIAGNOSIS — Z13820 Encounter for screening for osteoporosis: Secondary | ICD-10-CM

## 2020-07-26 DIAGNOSIS — I10 Essential (primary) hypertension: Secondary | ICD-10-CM | POA: Diagnosis not present

## 2020-07-26 DIAGNOSIS — IMO0001 Reserved for inherently not codable concepts without codable children: Secondary | ICD-10-CM

## 2020-07-26 DIAGNOSIS — F5101 Primary insomnia: Secondary | ICD-10-CM

## 2020-07-26 DIAGNOSIS — R911 Solitary pulmonary nodule: Secondary | ICD-10-CM

## 2020-07-26 DIAGNOSIS — I6523 Occlusion and stenosis of bilateral carotid arteries: Secondary | ICD-10-CM | POA: Diagnosis not present

## 2020-07-26 LAB — POCT URINE DRUG SCREEN
POC Amphetamine UR: NOT DETECTED
POC BENZODIAZEPINES UR: POSITIVE — AB
POC Barbiturate UR: NOT DETECTED
POC Cocaine UR: NOT DETECTED
POC Ecstasy UR: NOT DETECTED
POC Marijuana UR: NOT DETECTED
POC Methadone UR: NOT DETECTED
POC Methamphetamine UR: NOT DETECTED
POC Opiate Ur: NOT DETECTED
POC Oxycodone UR: NOT DETECTED
POC PHENCYCLIDINE UR: NOT DETECTED
POC TRICYCLICS UR: NOT DETECTED

## 2020-07-26 MED ORDER — HYDROCHLOROTHIAZIDE 25 MG PO TABS: ORAL_TABLET | ORAL | 3 refills | Status: DC

## 2020-07-26 MED ORDER — ZOLPIDEM TARTRATE 10 MG PO TABS: 10.0000 mg | ORAL_TABLET | Freq: Every evening | ORAL | 2 refills | Status: DC | PRN

## 2020-07-26 NOTE — Progress Notes (Signed)
[facility] [place], [place] [address]  Internal MEDICINE  Office Visit Note  Patient Name: Valerie Ramos  [id number]  [id number]  Date of Service: 08/18/2020   Pt is here for routine health maintenance examination  Chief Complaint  Patient presents with  . Annual Exam  . Depression  . Diabetes  . Hypertension  . Quality Metric Gaps    tetnaus  . controlled substance form    reviewed     The patient is here for health maintenance exam. Blood pressure is mildly elevated, however, it is normally well controlled. She is due to have routine, fasting labs. She will be due to have routine, fasting labs next month. She should have a bone density test. Reviewing prior studies, she had CTA of chest and abdomen done in 10/2018 which showed a 18mm nodular density in the left upper lobe of the lungs. A follow up study has not been done at this time. The patient denies chest pain, chest pressure, or frequent headaches. She does have some shortness of breath, especially with exertion. She does have type 2 diabetes. Her last HgbA1c was done 06/07/2020 and was 7.1. the patient reports that blood sugars have been running in the low to mid 100s.     Current Medication: Outpatient Encounter Medications as of 07/26/2020  Medication Sig  . acetaminophen (TYLENOL) 325 MG tablet Take 2 tablets (650 mg total) by mouth every 6 (six) hours as needed for mild pain (or Fever >/= 101).  . DULoxetine (CYMBALTA) 30 MG capsule Take 30 mg by mouth daily. (Patient not taking: Reported on 06/07/2020)  . estrogens, conjugated, (PREMARIN) 0.9 MG tablet 1 TABLET, COATED BY MOUTH ONCE A DAY  . famotidine (PEPCID) 20 MG tablet Take 1 tablet (20 mg total) by mouth 2 (two) times daily.  Marland Kitchen gabapentin (NEURONTIN) 100 MG capsule Take 1 capsule po every evening. May increase to twice daily as needed and as tolerated .  . glucose blood (ONE TOUCH ULTRA TEST) test strip FOR ONCE DAILY TESTING DX. E11.65  .  hydrochlorothiazide (HYDRODIURIL) 25 MG tablet TAKE 1 TABLET(S) BY MOUTH DAILY FOR BLOOD PRESSURE/EDEMA  . meloxicam (MOBIC) 7.5 MG tablet Take 1 tablet (7.5 mg total) by mouth daily.  . metFORMIN (GLUCOPHAGE) 500 MG tablet Take 1 tablet (500 mg total) by mouth 2 (two) times daily with a meal.  . metoCLOPramide (REGLAN) 10 MG tablet TAKE 1 TABLET THREE TIMES A DAY AS NEEDED FOR REFLUX  . OneTouch Delica Lancets 31V MISC 1 each by Does not apply route daily. Use as directed once daily diag e11.65  . pantoprazole (PROTONIX) 40 MG tablet Take 1 tablet (40 mg total) by mouth daily.  Marland Kitchen tiZANidine (ZANAFLEX) 2 MG tablet Take 1 tablet (2 mg total) by mouth 2 (two) times daily as needed for muscle spasms.  Marland Kitchen zolpidem (AMBIEN) 10 MG tablet Take 1 tablet (10 mg total) by mouth at bedtime as needed for sleep.  . [DISCONTINUED] ALPRAZolam (XANAX) 0.5 MG tablet TAKE 1 TABLET TWICE A DAY AS NEEDED FOR ANXIETY  . [DISCONTINUED] cyclobenzaprine (FLEXERIL) 5 MG tablet Take 1 tablet (5 mg total) by mouth at bedtime as needed.  . [DISCONTINUED] dicyclomine (BENTYL) 10 MG capsule Take 1 capsule (10 mg total) by mouth 3 (three) times daily before meals. (Patient not taking: Reported on 06/07/2020)  . [DISCONTINUED] hydrochlorothiazide (HYDRODIURIL) 25 MG tablet TAKE 1 TABLET(S) BY MOUTH DAILY FOR BLOOD PRESSURE/EDEMA  . [DISCONTINUED] ibuprofen (ADVIL) 600 MG  tablet Take 1 tablet (600 mg total) by mouth every 8 (eight) hours as needed.  . [DISCONTINUED] losartan (COZAAR) 25 MG tablet Take 1 tablet (25 mg total) by mouth daily.  . [DISCONTINUED] methylPREDNISolone (MEDROL) 4 MG TBPK tablet Take by mouth as directed for 6 days (Patient not taking: Reported on 06/07/2020)  . [DISCONTINUED] phenazopyridine (PYRIDIUM) 200 MG tablet Take 1 tablet (200 mg total) by mouth 3 (three) times daily as needed for pain. (Patient not taking: Reported on 06/07/2020)  . [DISCONTINUED] rosuvastatin (CRESTOR) 5 MG tablet Take 1 tablet (5 mg  total) by mouth daily.  . [DISCONTINUED] traMADol (ULTRAM) 50 MG tablet TAKE 1 TO 2 TABLETS BY MOUTH 3 TIMES A DAY AS NEEDED FOR PAIN (Patient not taking: Reported on 06/07/2020)  . [DISCONTINUED] zolpidem (AMBIEN) 10 MG tablet Take 1 tablet (10 mg total) by mouth at bedtime as needed for sleep.   No facility-administered encounter medications on file as of 07/26/2020.    Surgical History: Past Surgical History:  Procedure Laterality Date  . ABDOMINAL HYSTERECTOMY    . CESAREAN SECTION  1989  . CYSTECTOMY  2000   Ovarian  . OOPHORECTOMY    . OVARIAN CYST SURGERY  2006  . RECTOVAGINAL FISTULA CLOSURE  1980    Medical History: Past Medical History:  Diagnosis Date  . Anxiety   . Arthritis   . Depression   . Diabetes 1.5, managed as type 2 ([place])   . Hypertension   . Insomnia   . Migraines   . Reflux   . Sinusitis     Family History: Family History  Problem Relation Age of Onset  . Hodgkin's lymphoma Mother   . Diabetes Mother   . Thyroid disease Mother   . Glaucoma Mother   . Stroke Brother   . Heart disease Brother       Review of Systems  Constitutional: Negative for chills, fatigue and unexpected weight change.  HENT: Negative for congestion, postnasal drip, rhinorrhea, sneezing and sore throat.   Respiratory: Negative for cough, chest tightness, shortness of breath and wheezing.   Cardiovascular: Negative for chest pain, palpitations and leg swelling.  Gastrointestinal: Negative for abdominal pain, constipation, diarrhea, nausea and vomiting.  Endocrine: Negative for cold intolerance, heat intolerance, polydipsia and polyuria.       Blood sugars doing well   Genitourinary: Negative for dysuria, frequency and urgency.  Musculoskeletal: Positive for back pain. Negative for arthralgias, joint swelling and neck pain.  Skin: Negative for rash.  Allergic/Immunologic: Negative for environmental allergies.  Neurological: Negative for tremors and numbness.        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   Hematological: Negative for adenopathy. Does not bruise/bleed easily.  Psychiatric/Behavioral: Positive for sleep disturbance. Negative for behavioral problems (Depression) and suicidal ideas. The patient is nervous/anxious.      Today's Vitals   07/26/20 1143  BP: (!) 148/88  Pulse: 85  Resp: 16  Temp: 98 F (36.7 C)  SpO2: 98%  Weight: 237 lb (107.5 kg)  Height: 5\' 8"  (1.727 m)   Body mass index is 36.04 kg/m.  Physical Exam Vitals and nursing note reviewed.  Constitutional:      General: She is not in acute distress.    Appearance: Normal appearance. She is well-developed. She is obese. She is not diaphoretic.  HENT:     Head: Normocephalic and atraumatic.     Nose: Nose normal.     Mouth/Throat:  Pharynx: No oropharyngeal exudate.  Eyes:     Pupils: Pupils are equal, round, and reactive to light.  Neck:     Thyroid: No thyromegaly.     Vascular: No carotid bruit or JVD.     Trachea: No tracheal deviation.  Cardiovascular:     Rate and Rhythm: Normal rate and regular rhythm.     Pulses:          Dorsalis pedis pulses are 1+ on the right side and 1+ on the left side.       Posterior tibial pulses are 1+ on the right side and 1+ on the left side.     Heart sounds: Normal heart sounds. No murmur heard.  No friction rub. No gallop.   Pulmonary:     Effort: Pulmonary effort is normal. No respiratory distress.     Breath sounds: Normal breath sounds. No wheezing or rales.  Chest:     Chest wall: No tenderness.     Breasts:        Right: Normal. No swelling, bleeding, inverted nipple, mass, nipple discharge, skin change or tenderness.        Left: Normal. No swelling, bleeding, inverted nipple, mass, nipple discharge, skin change or tenderness.  Abdominal:     General: Bowel sounds are normal.     Palpations: Abdomen is soft.     Tenderness: There is no abdominal tenderness.   Musculoskeletal:        General: Normal range of motion.     Cervical back: Normal range of motion and neck supple.     Right foot: Normal range of motion. No deformity or bunion.     Left foot: Normal range of motion. No deformity or bunion.  Feet:     Right foot:     Protective Sensation: 10 sites tested. 10 sites sensed.     Skin integrity: Skin integrity normal.     Toenail Condition: Right toenails are normal.     Left foot:     Protective Sensation: 10 sites tested. 10 sites sensed.     Skin integrity: Skin integrity normal.     Toenail Condition: Left toenails are normal.     Comments: Some peripheral neuropathy present.  Lymphadenopathy:     Cervical: No cervical adenopathy.     Upper Body:     Right upper body: No axillary adenopathy.     Left upper body: No axillary adenopathy.  Skin:    General: Skin is warm and dry.  Neurological:     General: No focal deficit present.     Mental Status: She is alert and oriented to person, place, and time.     Cranial Nerves: No cranial nerve deficit.  Psychiatric:        Attention and Perception: Attention and perception normal.        Mood and Affect: Affect normal. Mood is anxious.        Speech: Speech normal.        Behavior: Behavior normal. Behavior is cooperative.        Thought Content: Thought content normal.        Cognition and Memory: Cognition and memory normal.        Judgment: Judgment normal.      LABS: Recent Results (from the past 2160 hour(s))  POCT HgB A1C     Status: Abnormal   Collection Time: 06/07/20 11:41 AM  Result Value Ref Range   Hemoglobin A1C 7.1 (A) 4.0 -  5.6 %   HbA1c POC (<> result, manual entry)     HbA1c, POC (prediabetic range)     HbA1c, POC (controlled diabetic range)    POCT Urine Drug Screen     Status: Abnormal   Collection Time: 07/26/20 11:52 AM  Result Value Ref Range   POC Methamphetamine UR None Detected None Detected   POC Opiate Ur None Detected None Detected   POC  Barbiturate UR None Detected None Detected   POC Amphetamine UR None Detected None Detected   POC Oxycodone UR None Detected None Detected   POC Cocaine UR None Detected None Detected   POC Ecstasy UR None Detected None Detected   POC TRICYCLICS UR None Detected None Detected   POC PHENCYCLIDINE UR None Detected None Detected   POC Marijuana UR None Detected None Detected   POC Methadone UR None Detected None Detected   POC BENZODIAZEPINES UR Positive (A) None Detected   URINE TEMPERATURE     POC DRUG SCREEN OXIDANTS URINE     POC SPECIFIC GRAVITY URINE     POC PH URINE     Methylenedioxyamphetamine      Assessment/Plan: 1. Encounter for general adult medical examination with abnormal findings Annual health maintenance exam today. Order slip given to have routine, fasting labs drawn.   2. Type 2 diabetes mellitus with hyperglycemia, without long-term current use of insulin ([place]) Most recent HgbA1c done 06/07/2020 and was 7.1. continue diabetic medication as prescribed and monitor blood sugars closely.   3. Essential (primary) hypertension Blood pressure stable. Continue all bp medication as prescribed  - hydrochlorothiazide (HYDRODIURIL) 25 MG tablet; TAKE 1 TABLET(S) BY MOUTH DAILY FOR BLOOD PRESSURE/EDEMA  Dispense: 90 tablet; Refill: 3  4. Carotid atherosclerosis, bilateral Carotid doppler study ordered today.  - US Carotid Bilateral; Future  5. Lung nodule < 6cm on CT Reviewed CTA chest/abdomen done 10/2018 showing a 27mm nodule in left upper lobe of the lungs. Will get CT Chest for surveillance.  - CT CHEST NODULE FOLLOW UP LOW DOSE W/O; Future  6. Primary insomnia Patient may continue to take zolpidem 10mg  at bedtime as needed for insmonia. A new prescription was sent to her pharmacy today . - zolpidem (AMBIEN) 10 MG tablet; Take 1 tablet (10 mg total) by mouth at bedtime as needed for sleep.  Dispense: 30 tablet; Refill: 2  7. Encounter for screening mammogram for malignant  neoplasm of breast - MM DIGITAL SCREENING BILATERAL; Future  8. Screening for osteoporosis - DG Bone Density; Future  9. Encounter for long-term (current) use of medications - POCT Urine Drug Screen appropriately positive for BZO only.  General Counseling: indianna boran understanding of the findings of todays visit and agrees with plan of treatment. I have discussed any further diagnostic evaluation that may be needed or ordered today. We also reviewed her medications today. she has been encouraged to call the office with any questions or concerns that should arise related to todays visit.    Counseling:  This patient was seen by Leretha Pol FNP Collaboration with Dr Lavera Guise as a part of collaborative care agreement  Orders Placed This Encounter  Procedures  . MM DIGITAL SCREENING BILATERAL  . DG Bone Density  . CT CHEST NODULE FOLLOW UP LOW DOSE W/O  . US Carotid Bilateral  . POCT Urine Drug Screen    Meds ordered this encounter  Medications  . hydrochlorothiazide (HYDRODIURIL) 25 MG tablet    Sig: TAKE 1 TABLET(S) BY MOUTH  DAILY FOR BLOOD PRESSURE/EDEMA    Dispense:  90 tablet    Refill:  3    Order Specific Question:   Supervising Provider    Answer:   Lavera Guise [[id number]]  . zolpidem (AMBIEN) 10 MG tablet    Sig: Take 1 tablet (10 mg total) by mouth at bedtime as needed for sleep.    Dispense:  30 tablet    Refill:  2    Order Specific Question:   Supervising Provider    Answer:   Lavera Guise [[id number]]    Total time spent: 39 Minutes  Time spent includes review of chart, medications, test results, and follow up plan with the patient.     Lavera Guise, MD  Internal Medicine

## 2020-08-01 ENCOUNTER — Other Ambulatory Visit: Payer: Self-pay

## 2020-08-01 DIAGNOSIS — IMO0001 Reserved for inherently not codable concepts without codable children: Secondary | ICD-10-CM

## 2020-08-01 DIAGNOSIS — R911 Solitary pulmonary nodule: Secondary | ICD-10-CM

## 2020-08-01 NOTE — Progress Notes (Signed)
Changed ct cpt code

## 2020-08-01 NOTE — Progress Notes (Signed)
Scanned in pt outside labs.

## 2020-08-03 ENCOUNTER — Other Ambulatory Visit: Payer: Self-pay | Admitting: Nurse Practitioner

## 2020-08-03 ENCOUNTER — Other Ambulatory Visit: Payer: Self-pay

## 2020-08-03 ENCOUNTER — Telehealth: Payer: Self-pay

## 2020-08-03 DIAGNOSIS — M25511 Pain in right shoulder: Secondary | ICD-10-CM

## 2020-08-03 DIAGNOSIS — F411 Generalized anxiety disorder: Secondary | ICD-10-CM

## 2020-08-03 DIAGNOSIS — E1165 Type 2 diabetes mellitus with hyperglycemia: Secondary | ICD-10-CM

## 2020-08-03 DIAGNOSIS — M5000 Cervical disc disorder with myelopathy, unspecified cervical region: Secondary | ICD-10-CM

## 2020-08-03 MED ORDER — ALPRAZOLAM 0.5 MG PO TABS: ORAL_TABLET | ORAL | 2 refills | Status: DC

## 2020-08-03 MED ORDER — IBUPROFEN 600 MG PO TABS: 600.0000 mg | ORAL_TABLET | Freq: Three times a day (TID) | ORAL | 2 refills | Status: DC | PRN

## 2020-08-03 MED ORDER — ROSUVASTATIN CALCIUM 5 MG PO TABS: 5.0000 mg | ORAL_TABLET | Freq: Every day | ORAL | 3 refills | Status: DC

## 2020-08-08 ENCOUNTER — Other Ambulatory Visit: Payer: Self-pay | Admitting: Nurse Practitioner

## 2020-08-08 DIAGNOSIS — Z1231 Encounter for screening mammogram for malignant neoplasm of breast: Secondary | ICD-10-CM

## 2020-08-09 ENCOUNTER — Ambulatory Visit
Admission: RE | Admit: 2020-08-09 | Discharge: 2020-08-09 | Disposition: A | Discharge status: Home / Self Care | Payer: No Typology Code available for payment source | Source: Ambulatory Visit | Attending: Nurse Practitioner | Admitting: Nurse Practitioner

## 2020-08-09 ENCOUNTER — Other Ambulatory Visit: Payer: Self-pay

## 2020-08-09 DIAGNOSIS — R911 Solitary pulmonary nodule: Secondary | ICD-10-CM | POA: Insufficient documentation

## 2020-08-09 DIAGNOSIS — IMO0001 Reserved for inherently not codable concepts without codable children: Secondary | ICD-10-CM

## 2020-08-17 ENCOUNTER — Other Ambulatory Visit: Payer: Self-pay | Admitting: Nurse Practitioner

## 2020-08-17 ENCOUNTER — Telehealth: Payer: Self-pay

## 2020-08-17 DIAGNOSIS — I10 Essential (primary) hypertension: Secondary | ICD-10-CM

## 2020-08-17 MED ORDER — LOSARTAN POTASSIUM 25 MG PO TABS: 50.0000 mg | ORAL_TABLET | Freq: Every day | ORAL | 3 refills | Status: DC

## 2020-08-17 NOTE — Telephone Encounter (Signed)
I will have her double her losartan to 50mg . She can take two of the tablets she has at once. She can take second dose now. She needs to rest and in a few hours, recheck blood pressure.

## 2020-08-17 NOTE — Telephone Encounter (Signed)
Pt notified about med and also advised if she had chest pain go to ED and take bp reading and call us back next week

## 2020-08-18 DIAGNOSIS — I6523 Occlusion and stenosis of bilateral carotid arteries: Secondary | ICD-10-CM | POA: Insufficient documentation

## 2020-08-18 DIAGNOSIS — R911 Solitary pulmonary nodule: Secondary | ICD-10-CM | POA: Insufficient documentation

## 2020-08-18 DIAGNOSIS — IMO0001 Reserved for inherently not codable concepts without codable children: Secondary | ICD-10-CM | POA: Insufficient documentation

## 2020-08-18 DIAGNOSIS — Z13820 Encounter for screening for osteoporosis: Secondary | ICD-10-CM | POA: Insufficient documentation

## 2020-08-21 NOTE — Telephone Encounter (Signed)
Medication was refilled.

## 2020-08-22 ENCOUNTER — Ambulatory Visit: Payer: No Typology Code available for payment source

## 2020-08-22 DIAGNOSIS — E1165 Type 2 diabetes mellitus with hyperglycemia: Secondary | ICD-10-CM

## 2020-08-22 DIAGNOSIS — I6523 Occlusion and stenosis of bilateral carotid arteries: Secondary | ICD-10-CM | POA: Diagnosis not present

## 2020-08-31 NOTE — Procedures (Addendum)
[place],  [id number]  DATE OF SERVICE: 08/22/2020  CAROTID DOPPLER INTERPRETATION:  Bilateral Carotid Ultrsasound and Color Doppler Examination was performed. The RIGHT CCA shows mild plaque in the vessel. The LEFT CCA shows mild plaque in the vessel. There was no significant intimal thickening noted in the RIGHT carotid artery. There was no significant intimal thickening in the LEFT carotid artery.   The RIGHT CCA shows peak systolic velocity of 544 cm per second. The end diastolic velocity is 36 cm per second on the RIGHT side. The RIGHT ICA shows peak systolic velocity of 920 per second. RIGHT sided ICA end diastolic velocity is 30 cm per second. The RIGHT ECA shows a peak systolic velocity of 100 cm per second. The ICA/CCA ratio is calculated to be 1.0. This suggests 50 to 60% stenosis. The Vertebral Artery shows antegrade flow.  The LEFT CCA shows peak systolic velocity of 712 cm per second. The end diastolic velocity is 32 cm per second on the LEFT side. The LEFT ICA shows peak systolic velocity of 197 per second. LEFT sided ICA end diastolic velocity is 32 cm per second. The LEFT ECA shows a peak systolic velocity of 96 cm per second. The ICA/CCA ratio is calculated to be 0.91. This suggests less than 50% stenosis. The Vertebral Artery shows antegrade flow.   Impression:    The RIGHT CAROTID shows 50 to 60% stenosis. The LEFT CAROTID shows less than 50% stenosis.  There is mild plaque formation noted on the LEFT and mild plaque on the RIGHT  side. Consider a repeat Carotid doppler if clinical situation and symptoms warrant in 6-12 months. Patient should be encouraged to change lifestyles such as smoking cessation, regular exercise and dietary modification. Use of statins in the right clinical setting and ASA is encouraged.  Allyne Gee, MD [facility] Pulmonary Critical Care Medicine

## 2020-09-04 ENCOUNTER — Other Ambulatory Visit: Payer: Self-pay

## 2020-09-04 DIAGNOSIS — M546 Pain in thoracic spine: Secondary | ICD-10-CM

## 2020-09-04 DIAGNOSIS — E1142 Type 2 diabetes mellitus with diabetic polyneuropathy: Secondary | ICD-10-CM

## 2020-09-04 DIAGNOSIS — I10 Essential (primary) hypertension: Secondary | ICD-10-CM

## 2020-09-04 MED ORDER — LOSARTAN POTASSIUM 25 MG PO TABS: 50.0000 mg | ORAL_TABLET | Freq: Every day | ORAL | 3 refills | Status: DC

## 2020-09-04 MED ORDER — TIZANIDINE HCL 2 MG PO TABS: 2.0000 mg | ORAL_TABLET | Freq: Two times a day (BID) | ORAL | 3 refills | Status: DC | PRN

## 2020-09-04 MED ORDER — GABAPENTIN 100 MG PO CAPS: ORAL_CAPSULE | ORAL | 3 refills | Status: DC

## 2020-09-04 NOTE — Progress Notes (Signed)
Mild plaque bilaterally. There is <50% stenosis on the left and 50-60% stenosis on the right. Review with patient at next visit.

## 2020-09-04 NOTE — Progress Notes (Signed)
Stable chest CT. Review with patient at next visit.

## 2020-09-25 ENCOUNTER — Ambulatory Visit: Payer: No Typology Code available for payment source | Admitting: Nurse Practitioner

## 2020-09-25 ENCOUNTER — Other Ambulatory Visit: Payer: Self-pay

## 2020-09-25 ENCOUNTER — Encounter: Payer: Self-pay | Admitting: Nurse Practitioner

## 2020-09-25 VITALS — BP 163/83 | HR 83 | Temp 98.5°F | Resp 16 | Ht 68.0 in | Wt 238.0 lb

## 2020-09-25 DIAGNOSIS — I872 Venous insufficiency (chronic) (peripheral): Secondary | ICD-10-CM | POA: Diagnosis not present

## 2020-09-25 DIAGNOSIS — E1165 Type 2 diabetes mellitus with hyperglycemia: Secondary | ICD-10-CM

## 2020-09-25 DIAGNOSIS — I10 Essential (primary) hypertension: Secondary | ICD-10-CM

## 2020-09-25 DIAGNOSIS — R600 Localized edema: Secondary | ICD-10-CM | POA: Diagnosis not present

## 2020-09-25 DIAGNOSIS — M7701 Medial epicondylitis, right elbow: Secondary | ICD-10-CM

## 2020-09-25 LAB — POCT GLYCOSYLATED HEMOGLOBIN (HGB A1C): Hemoglobin A1C: 6.9 % — AB (ref 4.0–5.6)

## 2020-09-25 MED ORDER — ATENOLOL 25 MG PO TABS: ORAL_TABLET | ORAL | 3 refills | Status: DC

## 2020-09-25 NOTE — Progress Notes (Signed)
[facility] [place], [place] [address]  Internal MEDICINE  Office Visit Note  Patient Name: Valerie Ramos  [id number]  [id number]  Date of Service: 09/26/2020  Chief Complaint  Patient presents with  . Follow-up  . Diabetes  . Hypertension  . Depression  . Joint Swelling    Pain in right arm and swelling in ankles; lasting about a month. Has taken Tylenol, not effective.    The patient is here for follow up visit.  -carotid doppler showing mild plaque in bilateral carotid arteries. There is <50% stenosis on left side and 50-60% on right side. She is on crestor 5mg  daily. Will monitor yearly. -CT chest showing 53mm nodule in left upper lobe of the lung. This is unchanged since 2009 and does not require further follow up. Also noted is minimal aortic atherosclerosis and minimal emphysema.  -continues to have mild swelling in both ankles. Reviewing prior venous doppler from earlier this year, she was negative for clots or stenosis. There was gross venous reflux noted. She does not wear compression stockings at this time.  -right elbow pain. Has been going on for about a month. Hurts when bending the right arm especially if there is weight. Hurts to keep arm bent for longer period of time. Has been lifting her infant granddaughter quite a bit. Though left handed, she uses her right hand to do a lot of manual activities in her home.  -has noted a black spot under the great toenail of her right foot. States that this was noted when she had a pedicure back in October. Unable to show it to me today as she does have polish on her toenails.  -elevated blood pressure today. Was elevated prior to this visit. Increased losartan to 50mg  daily. Taking HCTZ 25mg  daily.       Current Medication: Outpatient Encounter Medications as of 09/25/2020  Medication Sig  . acetaminophen (TYLENOL) 325 MG tablet Take 2 tablets (650 mg total) by mouth every 6 (six) hours as needed for  mild pain (or Fever >/= 101).  Marland Kitchen ALPRAZolam (XANAX) 0.5 MG tablet TAKE 1 TABLET TWICE A DAY AS NEEDED FOR ANXIETY  . atenolol (TENORMIN) 25 MG tablet Take 1/2 tablet po every evening  . DULoxetine (CYMBALTA) 30 MG capsule Take 30 mg by mouth daily.  Marland Kitchen estrogens, conjugated, (PREMARIN) 0.9 MG tablet 1 TABLET, COATED BY MOUTH ONCE A DAY  . gabapentin (NEURONTIN) 100 MG capsule Take 1 capsule po every evening. May increase to twice daily as needed and as tolerated .  . glucose blood (ONE TOUCH ULTRA TEST) test strip FOR ONCE DAILY TESTING DX. E11.65  . hydrochlorothiazide (HYDRODIURIL) 25 MG tablet TAKE 1 TABLET(S) BY MOUTH DAILY FOR BLOOD PRESSURE/EDEMA  . ibuprofen (ADVIL) 600 MG tablet Take 1 tablet (600 mg total) by mouth every 8 (eight) hours as needed.  Marland Kitchen losartan (COZAAR) 25 MG tablet Take 2 tablets (50 mg total) by mouth daily.  . meloxicam (MOBIC) 7.5 MG tablet Take 1 tablet (7.5 mg total) by mouth daily.  . metFORMIN (GLUCOPHAGE) 500 MG tablet Take 1 tablet (500 mg total) by mouth 2 (two) times daily with a meal.  . metoCLOPramide (REGLAN) 10 MG tablet TAKE 1 TABLET THREE TIMES A DAY AS NEEDED FOR REFLUX  . OneTouch Delica Lancets 99991111 MISC 1 each by Does not apply route daily. Use as directed once daily diag e11.65  . pantoprazole (PROTONIX) 40 MG tablet Take 1 tablet (40 mg total)  by mouth daily.  . rosuvastatin (CRESTOR) 5 MG tablet Take 1 tablet (5 mg total) by mouth daily.  Marland Kitchen tiZANidine (ZANAFLEX) 2 MG tablet Take 1 tablet (2 mg total) by mouth 2 (two) times daily as needed for muscle spasms.  Marland Kitchen zolpidem (AMBIEN) 10 MG tablet Take 1 tablet (10 mg total) by mouth at bedtime as needed for sleep.  . famotidine (PEPCID) 20 MG tablet Take 1 tablet (20 mg total) by mouth 2 (two) times daily.   No facility-administered encounter medications on file as of 09/25/2020.    Surgical History: Past Surgical History:  Procedure Laterality Date  . ABDOMINAL HYSTERECTOMY    . CESAREAN SECTION   1989  . CYSTECTOMY  2000   Ovarian  . OOPHORECTOMY    . OVARIAN CYST SURGERY  2006  . RECTOVAGINAL FISTULA CLOSURE  1980    Medical History: Past Medical History:  Diagnosis Date  . Anxiety   . Arthritis   . Depression   . Diabetes 1.5, managed as type 2 ([place])   . Hypertension   . Insomnia   . Migraines   . Reflux   . Sinusitis     Family History: Family History  Problem Relation Age of Onset  . Hodgkin's lymphoma Mother   . Diabetes Mother   . Thyroid disease Mother   . Glaucoma Mother   . Stroke Brother   . Heart disease Brother     Social History   Socioeconomic History  . Marital status: Married    Spouse name: Not on file  . Number of children: Not on file  . Years of education: Not on file  . Highest education level: Not on file  Occupational History  . Not on file  Tobacco Use  . Smoking status: Former Smoker    Types: Cigarettes    Quit date: 05/24/2011    Years since quitting: 9.3  . Smokeless tobacco: Never Used  Substance and Sexual Activity  . Alcohol use: No  . Drug use: No  . Sexual activity: Not on file  Other Topics Concern  . Not on file  Social History Narrative  . Not on file   Social Determinants of Health   Financial Resource Strain: Not on file  Food Insecurity: Not on file  Transportation Needs: Not on file  Physical Activity: Not on file  Stress: Not on file  Social Connections: Not on file  Intimate Partner Violence: Not on file      Review of Systems  Constitutional: Negative for chills, fatigue and unexpected weight change.  HENT: Negative for congestion, postnasal drip, rhinorrhea, sneezing and sore throat.   Respiratory: Negative for cough, chest tightness, shortness of breath and wheezing.   Cardiovascular: Positive for leg swelling. Negative for chest pain and palpitations.       Persistent swelling in both lower legs. Blood pressure elevated.  Gastrointestinal: Negative for abdominal pain, constipation,  diarrhea, nausea and vomiting.  Endocrine: Negative for cold intolerance, heat intolerance, polydipsia and polyuria.       Improved blood sugars.   Genitourinary: Negative for urgency.  Musculoskeletal: Positive for back pain. Negative for arthralgias, joint swelling and neck pain.       Right elbow pain.   Skin: Negative for rash.  Allergic/Immunologic: Negative for environmental allergies.  Neurological: Negative for tremors and numbness.       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   Hematological: Negative for adenopathy. Does not bruise/bleed  easily.  Psychiatric/Behavioral: Positive for sleep disturbance. Negative for behavioral problems (Depression) and suicidal ideas. The patient is nervous/anxious.     Today's Vitals   09/25/20 1107  BP: (!) 163/83  Pulse: 83  Resp: 16  Temp: 98.5 F (36.9 C)  SpO2: 98%  Weight: 238 lb (108 kg)  Height: 5\' 8"  (1.727 m)   Body mass index is 36.19 kg/m.  Physical Exam Vitals and nursing note reviewed.  Constitutional:      General: She is not in acute distress.    Appearance: Normal appearance. She is well-developed and well-nourished. She is obese. She is not diaphoretic.  HENT:     Head: Normocephalic and atraumatic.     Mouth/Throat:     Mouth: Oropharynx is clear and moist.     Pharynx: No oropharyngeal exudate.  Eyes:     Extraocular Movements: EOM normal.     Pupils: Pupils are equal, round, and reactive to light.  Neck:     Thyroid: No thyromegaly.     Vascular: No carotid bruit or JVD.     Trachea: No tracheal deviation.  Cardiovascular:     Rate and Rhythm: Normal rate and regular rhythm.     Heart sounds: Normal heart sounds. No murmur heard. No friction rub. No gallop.      Comments: There is mild swelling present in both ankles and feet.  Pulmonary:     Effort: Pulmonary effort is normal. No respiratory distress.     Breath sounds: Normal breath sounds. No wheezing or  rales.  Chest:     Chest wall: No tenderness.  Abdominal:     Palpations: Abdomen is soft.  Musculoskeletal:        General: Normal range of motion.     Cervical back: Normal range of motion and neck supple.     Comments: Tenderness of right elbow. Direct palpation increases pain. Flexion of the elbow and picking up objects increases the pain. There is minimal swelling of elbow present. There are no bony abnormalities or deformities noted   Lymphadenopathy:     Cervical: No cervical adenopathy.  Skin:    General: Skin is warm and dry.  Neurological:     General: No focal deficit present.     Mental Status: She is alert and oriented to person, place, and time.     Cranial Nerves: No cranial nerve deficit.  Psychiatric:        Attention and Perception: Attention and perception normal.        Mood and Affect: Affect normal. Mood is anxious.        Speech: Speech normal.        Behavior: Behavior normal. Behavior is cooperative.        Thought Content: Thought content normal.        Cognition and Memory: Cognition and memory normal.        Judgment: Judgment normal.    Assessment/Plan: 1. Type 2 diabetes mellitus with hyperglycemia, without long-term current use of insulin (HCC) - POCT HgB A1C 6.9 today. Continue diabetic medication as prescribed. Refer for diabetic eye exam.  - Ambulatory referral to Ophthalmology  2. Venous insufficiency of both lower extremities Reviewed LE doppler done earlier this year. Though negative for clots or stenosis, marked venous reflux was noted. Will get ultrasound of lower legs for further evaluation. Recommended she start wearing compression socks everyday.  - VAS [place] LOWER EXTREMITY VENOUS REFLUX; Future  3. Pedal edema Reviewed LE doppler done  earlier this year. Though negative for clots or stenosis, marked venous reflux was noted. Will get ultrasound of lower legs for further evaluation. Recommended she start wearing compression socks everyday.   - VAS [place] LOWER EXTREMITY VENOUS REFLUX; Future  4. Epicondylitis elbow, medial, right Advised her to use ibuprofen as needed and as prescribed. Apply ice to effected area. Gentle ROM exercises recommended. Will get x-ray for further evaluation  - DG Elbow 2 Views Right; Future  5. Essential (primary) hypertension Add atenolol 12.5mg  every evening. Advised she monitor blood pressure closely at home. Heart healthy diet reviewed.  - atenolol (TENORMIN) 25 MG tablet; Take 1/2 tablet po every evening  Dispense: 30 tablet; Refill: 3  General Counseling: Melanee verbalizes understanding of the findings of todays visit and agrees with plan of treatment. I have discussed any further diagnostic evaluation that may be needed or ordered today. We also reviewed her medications today. she has been encouraged to call the office with any questions or concerns that should arise related to todays visit.  Cardiac risk factor modification:  1. Control blood pressure. 2. Exercise as prescribed. 3. Follow low sodium, low fat diet. and low fat and low cholestrol diet. 4. Take ASA 81mg  once a day. 5. Restricted calories diet to lose weight.  This patient was seen by FNP Collaboration with Dr Vincent Gros as a part of collaborative care agreement  Orders Placed This Encounter  Procedures  . DG Elbow 2 Views Right  . Ambulatory referral to Ophthalmology  . POCT HgB A1C  . VAS Lyndon Code LOWER EXTREMITY VENOUS REFLUX    Meds ordered this encounter  Medications  . atenolol (TENORMIN) 25 MG tablet    Sig: Take 1/2 tablet po every evening    Dispense:  30 tablet    Refill:  3    Order Specific Question:   Supervising Provider    Answer:   [place] [[id number]]    Total time spent: 45 Minutes Time spent includes review of chart, medications, test results, and follow up plan with the patient.      Dr Lyndon Code Internal medicine

## 2020-10-05 ENCOUNTER — Other Ambulatory Visit: Payer: Self-pay

## 2020-10-05 DIAGNOSIS — E1165 Type 2 diabetes mellitus with hyperglycemia: Secondary | ICD-10-CM

## 2020-10-05 DIAGNOSIS — I10 Essential (primary) hypertension: Secondary | ICD-10-CM

## 2020-10-05 MED ORDER — ROSUVASTATIN CALCIUM 5 MG PO TABS: 5.0000 mg | ORAL_TABLET | Freq: Every day | ORAL | 3 refills | Status: DC

## 2020-10-05 MED ORDER — LOSARTAN POTASSIUM 25 MG PO TABS: 50.0000 mg | ORAL_TABLET | Freq: Every day | ORAL | 3 refills | Status: DC

## 2020-10-12 ENCOUNTER — Other Ambulatory Visit: Payer: Self-pay

## 2020-10-12 DIAGNOSIS — M546 Pain in thoracic spine: Secondary | ICD-10-CM

## 2020-10-12 MED ORDER — TIZANIDINE HCL 2 MG PO TABS: 2.0000 mg | ORAL_TABLET | Freq: Two times a day (BID) | ORAL | 3 refills | Status: DC | PRN

## 2020-10-19 ENCOUNTER — Other Ambulatory Visit: Payer: No Typology Code available for payment source

## 2020-10-24 ENCOUNTER — Other Ambulatory Visit: Payer: No Typology Code available for payment source

## 2020-10-31 ENCOUNTER — Ambulatory Visit: Payer: No Typology Code available for payment source | Admitting: Hospice and Palliative Medicine

## 2020-11-01 ENCOUNTER — Other Ambulatory Visit: Payer: Self-pay

## 2020-11-01 DIAGNOSIS — F411 Generalized anxiety disorder: Secondary | ICD-10-CM

## 2020-11-02 MED ORDER — ALPRAZOLAM 0.5 MG PO TABS: ORAL_TABLET | ORAL | 0 refills | Status: DC

## 2020-11-20 ENCOUNTER — Other Ambulatory Visit: Payer: Self-pay | Admitting: Nurse Practitioner

## 2020-11-20 DIAGNOSIS — M5000 Cervical disc disorder with myelopathy, unspecified cervical region: Secondary | ICD-10-CM

## 2020-11-20 DIAGNOSIS — M546 Pain in thoracic spine: Secondary | ICD-10-CM

## 2020-11-20 DIAGNOSIS — M25511 Pain in right shoulder: Secondary | ICD-10-CM

## 2020-11-21 ENCOUNTER — Other Ambulatory Visit: Payer: No Typology Code available for payment source

## 2020-11-23 ENCOUNTER — Encounter: Payer: Self-pay | Admitting: Hospice and Palliative Medicine

## 2020-11-23 ENCOUNTER — Ambulatory Visit: Payer: No Typology Code available for payment source | Admitting: Hospice and Palliative Medicine

## 2020-11-23 ENCOUNTER — Other Ambulatory Visit: Payer: Self-pay

## 2020-11-23 VITALS — BP 132/88 | HR 80 | Temp 97.9°F | Resp 16 | Ht 68.0 in | Wt 240.2 lb

## 2020-11-23 DIAGNOSIS — F411 Generalized anxiety disorder: Secondary | ICD-10-CM

## 2020-11-23 DIAGNOSIS — F5101 Primary insomnia: Secondary | ICD-10-CM

## 2020-11-23 DIAGNOSIS — R300 Dysuria: Secondary | ICD-10-CM

## 2020-11-23 DIAGNOSIS — I872 Venous insufficiency (chronic) (peripheral): Secondary | ICD-10-CM

## 2020-11-23 DIAGNOSIS — K219 Gastro-esophageal reflux disease without esophagitis: Secondary | ICD-10-CM

## 2020-11-23 DIAGNOSIS — N959 Unspecified menopausal and perimenopausal disorder: Secondary | ICD-10-CM | POA: Diagnosis not present

## 2020-11-23 DIAGNOSIS — M546 Pain in thoracic spine: Secondary | ICD-10-CM

## 2020-11-23 LAB — POCT URINALYSIS DIPSTICK
Bilirubin, UA: NEGATIVE
Blood, UA: NEGATIVE
Glucose, UA: NEGATIVE
Ketones, UA: NEGATIVE
Leukocytes, UA: NEGATIVE
Nitrite, UA: NEGATIVE
Protein, UA: POSITIVE — AB
Spec Grav, UA: 1.015 (ref 1.010–1.025)
Urobilinogen, UA: 0.2 E.U./dL
pH, UA: 5 (ref 5.0–8.0)

## 2020-11-23 MED ORDER — ZOLPIDEM TARTRATE 10 MG PO TABS: 10.0000 mg | ORAL_TABLET | Freq: Every evening | ORAL | 2 refills | Status: DC | PRN

## 2020-11-23 MED ORDER — METOCLOPRAMIDE HCL 10 MG PO TABS: ORAL_TABLET | ORAL | 0 refills | Status: DC

## 2020-11-23 MED ORDER — TIZANIDINE HCL 2 MG PO TABS
2.0000 mg | ORAL_TABLET | Freq: Two times a day (BID) | ORAL | 3 refills | Status: DC | PRN
Start: 2020-11-23 — End: 2021-02-21

## 2020-11-23 MED ORDER — NITROFURANTOIN MONOHYD MACRO 100 MG PO CAPS: 100.0000 mg | ORAL_CAPSULE | Freq: Two times a day (BID) | ORAL | 0 refills | Status: DC

## 2020-11-23 NOTE — Progress Notes (Signed)
[facility] [place], [place] [address]  Internal MEDICINE  Office Visit Note  Patient Name: Valerie Ramos  [id number]  [id number]  Date of Service: 11/25/2020  Chief Complaint  Patient presents with  . Urinary Tract Infection    Started Wed, urgency but not able to void a lot, no pain or burning.  Marland Kitchen Hypertension  . Diabetes  . Depression     HPI Pt is here for a sick visit. C/o increased urinary frequency--started Wednesday, even after urinating feels the urge to go again and feels her bladder isn't emptying No further urinary complaints, no burning or irritation  Continues to have bilateral lower extremity edema--vascular US ordered at last visit has not been completed Swelling has been a chronic issue for several years History of varicose veins  Due to insurance must have labs drawn at a facility in [place] which is the reason routine labs have not been updated in a few years  Requesting refills today   Current Medication:  Outpatient Encounter Medications as of 11/23/2020  Medication Sig  . acetaminophen (TYLENOL) 325 MG tablet Take 2 tablets (650 mg total) by mouth every 6 (six) hours as needed for mild pain (or Fever >/= 101).  Marland Kitchen ALPRAZolam (XANAX) 0.5 MG tablet TAKE 1 TABLET TWICE A DAY AS NEEDED FOR ANXIETY  . atenolol (TENORMIN) 25 MG tablet Take 1/2 tablet po every evening  . DULoxetine (CYMBALTA) 30 MG capsule Take 30 mg by mouth daily.  Marland Kitchen estrogens, conjugated, (PREMARIN) 0.9 MG tablet 1 TABLET, COATED BY MOUTH ONCE A DAY  . gabapentin (NEURONTIN) 100 MG capsule Take 1 capsule po every evening. May increase to twice daily as needed and as tolerated .  . glucose blood (ONE TOUCH ULTRA TEST) test strip FOR ONCE DAILY TESTING DX. E11.65  . hydrochlorothiazide (HYDRODIURIL) 25 MG tablet TAKE 1 TABLET(S) BY MOUTH DAILY FOR BLOOD PRESSURE/EDEMA  . ibuprofen (ADVIL) 600 MG tablet TAKE 1 TABLET BY MOUTH EVERY 8 HOURS AS NEEDED.  Marland Kitchen losartan  (COZAAR) 25 MG tablet Take 2 tablets (50 mg total) by mouth daily.  . meloxicam (MOBIC) 7.5 MG tablet TAKE 1 TABLET BY MOUTH EVERY DAY  . metFORMIN (GLUCOPHAGE) 500 MG tablet Take 1 tablet (500 mg total) by mouth 2 (two) times daily with a meal.  . nitrofurantoin, macrocrystal-monohydrate, (MACROBID) 100 MG capsule Take 1 capsule (100 mg total) by mouth 2 (two) times daily.  Glory Rosebush Delica Lancets 40N MISC 1 each by Does not apply route daily. Use as directed once daily diag e11.65  . pantoprazole (PROTONIX) 40 MG tablet Take 1 tablet (40 mg total) by mouth daily.  . rosuvastatin (CRESTOR) 5 MG tablet Take 1 tablet (5 mg total) by mouth daily.  . [DISCONTINUED] metoCLOPramide (REGLAN) 10 MG tablet TAKE 1 TABLET THREE TIMES A DAY AS NEEDED FOR REFLUX  . [DISCONTINUED] tiZANidine (ZANAFLEX) 2 MG tablet Take 1 tablet (2 mg total) by mouth 2 (two) times daily as needed for muscle spasms.  . [DISCONTINUED] zolpidem (AMBIEN) 10 MG tablet Take 1 tablet (10 mg total) by mouth at bedtime as needed for sleep.  . famotidine (PEPCID) 20 MG tablet Take 1 tablet (20 mg total) by mouth 2 (two) times daily.  . metoCLOPramide (REGLAN) 10 MG tablet TAKE 1 TABLET THREE TIMES A DAY AS NEEDED FOR REFLUX  . tiZANidine (ZANAFLEX) 2 MG tablet Take 1 tablet (2 mg total) by mouth 2 (two) times daily as needed for muscle spasms.  Marland Kitchen  zolpidem (AMBIEN) 10 MG tablet Take 1 tablet (10 mg total) by mouth at bedtime as needed for sleep.   No facility-administered encounter medications on file as of 11/23/2020.      Medical History: Past Medical History:  Diagnosis Date  . Anxiety   . Arthritis   . Depression   . Diabetes 1.5, managed as type 2 ([place])   . Hypertension   . Insomnia   . Migraines   . Reflux   . Sinusitis      Vital Signs: BP 132/88   Pulse 80   Temp 97.9 F (36.6 C)   Resp 16   Ht 5\' 8"  (1.727 m)   Wt 240 lb 3.2 oz (109 kg)   SpO2 97%   BMI 36.52 kg/m    Review of Systems   Constitutional: Negative for chills, diaphoresis and fatigue.  HENT: Negative for ear pain, postnasal drip and sinus pressure.   Eyes: Negative for photophobia, discharge, redness, itching and visual disturbance.  Respiratory: Negative for cough, shortness of breath and wheezing.   Cardiovascular: Positive for leg swelling. Negative for chest pain and palpitations.  Gastrointestinal: Negative for abdominal pain, constipation, diarrhea, nausea and vomiting.  Genitourinary: Positive for frequency and urgency. Negative for dysuria and flank pain.  Musculoskeletal: Negative for arthralgias, back pain, gait problem and neck pain.  Skin: Negative for color change.  Allergic/Immunologic: Negative for environmental allergies and food allergies.  Neurological: Negative for dizziness and headaches.  Hematological: Does not bruise/bleed easily.  Psychiatric/Behavioral: Negative for agitation, behavioral problems (depression) and hallucinations.    Physical Exam Vitals reviewed.  Constitutional:      Appearance: Normal appearance. She is obese.  Cardiovascular:     Rate and Rhythm: Normal rate and regular rhythm.     Pulses: Normal pulses.     Heart sounds: Normal heart sounds.  Pulmonary:     Effort: Pulmonary effort is normal.     Breath sounds: Normal breath sounds.  Abdominal:     General: Abdomen is flat.     Palpations: Abdomen is soft.  Musculoskeletal:        General: Normal range of motion.     Cervical back: Normal range of motion.     Right lower leg: 2+ Pitting Edema present.     Left lower leg: 2+ Pitting Edema present.  Skin:    General: Skin is warm.  Neurological:     General: No focal deficit present.     Mental Status: She is alert and oriented to person, place, and time. Mental status is at baseline.  Psychiatric:        Mood and Affect: Mood normal.        Behavior: Behavior normal.        Thought Content: Thought content normal.        Judgment: Judgment normal.     Assessment/Plan: 1. Venous insufficiency of both lower extremities Referral to vascular for ongoing lymphedema without improvement with use of compression stockings Varicose veins bilaterally - Ambulatory referral to Vascular Surgery  2. Primary insomnia Ambien as needed--well controlled, requesting refills [place] Controlled Substance Database was reviewed by me for overdose risk score (ORS) - zolpidem (AMBIEN) 10 MG tablet; Take 1 tablet (10 mg total) by mouth at bedtime as needed for sleep.  Dispense: 30 tablet; Refill: 2  3. Gastro-esophageal reflux disease without esophagitis Symptoms well controlled, requesting refills - metoCLOPramide (REGLAN) 10 MG tablet; TAKE 1 TABLET THREE TIMES A DAY AS NEEDED FOR  REFLUX  Dispense: 270 tablet; Refill: 0  4. Dorsalgia of thoracic region Intermittent flares of pain, uses tizanidine as needed, requesting refills - tiZANidine (ZANAFLEX) 2 MG tablet; Take 1 tablet (2 mg total) by mouth 2 (two) times daily as needed for muscle spasms.  Dispense: 45 tablet; Refill: 3  5. Dysuria Start Macrobid, will send for culture - POCT Urinalysis Dipstick - nitrofurantoin, macrocrystal-monohydrate, (MACROBID) 100 MG capsule; Take 1 capsule (100 mg total) by mouth 2 (two) times daily.  Dispense: 20 capsule; Refill: 0  General Counseling: Donnamaria verbalizes understanding of the findings of todays visit and agrees with plan of treatment. I have discussed any further diagnostic evaluation that may be needed or ordered today. We also reviewed her medications today. she has been encouraged to call the office with any questions or concerns that should arise related to todays visit.   Orders Placed This Encounter  Procedures  . Ambulatory referral to Vascular Surgery  . POCT Urinalysis Dipstick    Meds ordered this encounter  Medications  . nitrofurantoin, macrocrystal-monohydrate, (MACROBID) 100 MG capsule    Sig: Take 1 capsule (100 mg total) by mouth 2 (two)  times daily.    Dispense:  20 capsule    Refill:  0  . zolpidem (AMBIEN) 10 MG tablet    Sig: Take 1 tablet (10 mg total) by mouth at bedtime as needed for sleep.    Dispense:  30 tablet    Refill:  2  . metoCLOPramide (REGLAN) 10 MG tablet    Sig: TAKE 1 TABLET THREE TIMES A DAY AS NEEDED FOR REFLUX    Dispense:  270 tablet    Refill:  0  . tiZANidine (ZANAFLEX) 2 MG tablet    Sig: Take 1 tablet (2 mg total) by mouth 2 (two) times daily as needed for muscle spasms.    Dispense:  45 tablet    Refill:  3    Time spent: 30 Minutes Time spent includes review of chart, medications, test results and follow-up plan with the patient.  This patient was seen by Theodoro Grist AGNP-C in Collaboration with Dr Lavera Guise as a part of collaborative care agreement.  Tanna Furry [facility] Internal Medicine

## 2020-11-25 ENCOUNTER — Encounter: Payer: Self-pay | Admitting: Hospice and Palliative Medicine

## 2020-11-26 ENCOUNTER — Other Ambulatory Visit: Payer: Self-pay | Admitting: Hospice and Palliative Medicine

## 2020-11-27 LAB — BASIC METABOLIC PANEL
BUN/Creatinine Ratio: 11 — ABNORMAL LOW (ref 12–28)
BUN: 9 mg/dL (ref 8–27)
CO2: 22 mmol/L (ref 20–29)
Calcium: 10 mg/dL (ref 8.7–10.3)
Chloride: 99 mmol/L (ref 96–106)
Creatinine, Ser: 0.84 mg/dL (ref 0.57–1.00)
Glucose: 108 mg/dL — ABNORMAL HIGH (ref 65–99)
Potassium: 4.2 mmol/L (ref 3.5–5.2)
Sodium: 140 mmol/L (ref 134–144)
eGFR: 79 mL/min/{1.73_m2} (ref >59)

## 2020-11-27 LAB — HGB A1C W/O EAG: Hgb A1c MFr Bld: 7.2 % — ABNORMAL HIGH (ref 4.8–5.6)

## 2020-11-27 LAB — CBC WITH DIFFERENTIAL/PLATELET
Basophils Absolute: 0.1 10*3/uL (ref 0.0–0.2)
Basos: 1 %
EOS (ABSOLUTE): 0.2 10*3/uL (ref 0.0–0.4)
Eos: 2 %
Hematocrit: 39.4 % (ref 34.0–46.6)
Hemoglobin: 13 g/dL (ref 11.1–15.9)
Immature Grans (Abs): 0 10*3/uL (ref 0.0–0.1)
Immature Granulocytes: 0 %
Lymphocytes Absolute: 3.4 10*3/uL — ABNORMAL HIGH (ref 0.7–3.1)
Lymphs: 31 %
MCH: 29.5 pg (ref 26.6–33.0)
MCHC: 33 g/dL (ref 31.5–35.7)
MCV: 89 fL (ref 79–97)
Monocytes Absolute: 0.9 10*3/uL (ref 0.1–0.9)
Monocytes: 8 %
Neutrophils Absolute: 6.5 10*3/uL (ref 1.4–7.0)
Neutrophils: 58 %
Platelets: 347 10*3/uL (ref 150–450)
RBC: 4.41 x10E6/uL (ref 3.77–5.28)
RDW: 12.6 % (ref 11.7–15.4)
WBC: 11.2 10*3/uL — ABNORMAL HIGH (ref 3.4–10.8)

## 2020-11-27 LAB — TSH: TSH: 2.45 u[IU]/mL (ref 0.450–4.500)

## 2020-11-27 LAB — LIPID PANEL WITH LDL/HDL RATIO
Cholesterol, Total: 141 mg/dL (ref 100–199)
HDL: 65 mg/dL (ref >39)
LDL Chol Calc (NIH): 51 mg/dL (ref 0–99)
LDL/HDL Ratio: 0.8 ratio (ref 0.0–3.2)
Triglycerides: 147 mg/dL (ref 0–149)
VLDL Cholesterol Cal: 25 mg/dL (ref 5–40)

## 2020-11-27 LAB — T4, FREE: Free T4: 1.07 ng/dL (ref 0.82–1.77)

## 2020-11-28 ENCOUNTER — Other Ambulatory Visit: Payer: Self-pay | Admitting: Hospice and Palliative Medicine

## 2020-11-28 DIAGNOSIS — R300 Dysuria: Secondary | ICD-10-CM

## 2020-11-29 NOTE — Progress Notes (Signed)
Labs reviewed, will be discussed at next office visit.

## 2020-12-03 ENCOUNTER — Other Ambulatory Visit: Payer: Self-pay

## 2020-12-03 ENCOUNTER — Other Ambulatory Visit: Payer: Self-pay | Admitting: Nurse Practitioner

## 2020-12-03 ENCOUNTER — Other Ambulatory Visit: Payer: Self-pay | Admitting: Hospice and Palliative Medicine

## 2020-12-03 DIAGNOSIS — F411 Generalized anxiety disorder: Secondary | ICD-10-CM

## 2020-12-03 DIAGNOSIS — K219 Gastro-esophageal reflux disease without esophagitis: Secondary | ICD-10-CM

## 2020-12-03 DIAGNOSIS — E1165 Type 2 diabetes mellitus with hyperglycemia: Secondary | ICD-10-CM

## 2020-12-03 MED ORDER — GLUCOSE BLOOD VI STRP: ORAL_STRIP | 3 refills | Status: DC

## 2020-12-03 NOTE — Telephone Encounter (Signed)
LASYAPPT 11/26/20 and 01/23/21

## 2020-12-04 ENCOUNTER — Ambulatory Visit: Payer: No Typology Code available for payment source | Admitting: Hospice and Palliative Medicine

## 2020-12-06 ENCOUNTER — Encounter (INDEPENDENT_AMBULATORY_CARE_PROVIDER_SITE_OTHER): Payer: Self-pay | Admitting: Vascular Surgery

## 2020-12-09 DIAGNOSIS — I872 Venous insufficiency (chronic) (peripheral): Secondary | ICD-10-CM | POA: Insufficient documentation

## 2020-12-09 DIAGNOSIS — I890 Lymphedema, not elsewhere classified: Secondary | ICD-10-CM | POA: Insufficient documentation

## 2020-12-09 NOTE — Progress Notes (Signed)
MRN : [id number]  Valerie Ramos is a 65 y.o. (May 26, 1956) female who presents with chief complaint of No chief complaint on file. Marland Kitchen  History of Present Illness:   Patient is seen for evaluation of leg pain and leg swelling. The patient first noticed the swelling remotely. The swelling is associated with pain and discoloration. The pain and swelling worsens with prolonged dependency and improves with elevation. The pain is unrelated to activity.  The patient notes that in the morning the legs are significantly improved but they steadily worsened throughout the course of the day. The patient also notes a steady worsening of the discoloration in the ankle and shin area.   The patient denies claudication symptoms.  The patient denies symptoms consistent with rest pain.  The patient denies and extensive history of DJD and LS spine disease.  The patient has no had any past angiography, interventions or vascular surgery.  Elevation makes the leg symptoms better, dependency makes them much worse. There is no history of ulcerations. The patient denies any recent changes in medications.  The patient has not been wearing graduated compression.  The patient denies a history of DVT or PE. There is no prior history of phlebitis. There is no history of primary lymphedema.  No history of malignancies. No history of trauma or groin or pelvic surgery. There is no history of radiation treatment to the groin or pelvis  The patient denies amaurosis fugax or recent TIA symptoms. There are no recent neurological changes noted. The patient denies recent episodes of angina or shortness of breath  No outpatient medications have been marked as taking for the 12/10/20 encounter (Appointment) with Delana Meyer, Dolores Lory, MD.    Past Medical History:  Diagnosis Date  . Anxiety   . Arthritis   . Depression   . Diabetes 1.5, managed as type 2 ([place])   . Hypertension   . Insomnia   . Migraines   . Reflux   .  Sinusitis     Past Surgical History:  Procedure Laterality Date  . ABDOMINAL HYSTERECTOMY    . CESAREAN SECTION  1989  . CYSTECTOMY  2000   Ovarian  . OOPHORECTOMY    . OVARIAN CYST SURGERY  2006  . RECTOVAGINAL FISTULA CLOSURE  1980    Social History Social History   Tobacco Use  . Smoking status: Former Smoker    Types: Cigarettes    Quit date: 05/24/2011    Years since quitting: 9.5  . Smokeless tobacco: Never Used  Substance Use Topics  . Alcohol use: No  . Drug use: No    Family History Family History  Problem Relation Age of Onset  . Hodgkin's lymphoma Mother   . Diabetes Mother   . Thyroid disease Mother   . Glaucoma Mother   . Stroke Brother   . Heart disease Brother   No family history of bleeding/clotting disorders, porphyria or autoimmune disease   Allergies  Allergen Reactions  . Sulfa Antibiotics Hives  . Oxycodone-Acetaminophen Itching    percocet     REVIEW OF SYSTEMS (Negative unless checked)  Constitutional: [] Weight loss  [] Fever  [] Chills Cardiac: [x] Chest pain   [] Chest pressure   [] Palpitations   [] Shortness of breath when laying flat   [] Shortness of breath with exertion. Vascular:  [] Pain in legs with walking   [x] Pain in legs at rest  [] History of DVT   [] Phlebitis   [x] Swelling in legs   [x] Varicose veins   [] Non-healing ulcers  Pulmonary:   [] Uses home oxygen   [] Productive cough   [] Hemoptysis   [] Wheeze  [] COPD   [] Asthma Neurologic:  [] Dizziness   [] Seizures   [] History of stroke   [] History of TIA  [] Aphasia   [] Vissual changes   [] Weakness or numbness in arm   [] Weakness or numbness in leg Musculoskeletal:   [] Joint swelling   [] Joint pain   [] Low back pain Hematologic:  [] Easy bruising  [] Easy bleeding   [] Hypercoagulable state   [] Anemic Gastrointestinal:  [] Diarrhea   [] Vomiting  [] Gastroesophageal reflux/heartburn   [] Difficulty swallowing. Genitourinary:  [] Chronic kidney disease   [] Difficult urination  [] Frequent urination    [] Blood in urine Skin:  [] Rashes   [] Ulcers  Psychological:  [] History of anxiety   []  History of major depression.  Physical Examination  There were no vitals filed for this visit. There is no height or weight on file to calculate BMI. Gen: WD/WN, NAD Head: [place]/AT, No temporalis wasting.  Ear/Nose/Throat: Hearing grossly intact, nares w/o erythema or drainage, poor dentition Eyes: PER, EOMI, sclera nonicteric.  Neck: Supple, no masses.  No bruit or JVD.  Pulmonary:  Good air movement, clear to auscultation bilaterally, no use of accessory muscles.  Cardiac: RRR, normal S1, S2, no Murmurs. Vascular: scattered varicosities present bilaterally.  Moderate venous stasis changes to the legs bilaterally.  3-4+ soft pitting edema. Vessel Right Left  Radial Palpable Palpable  Carotid Palpable Palpable  PT Palpable Palpable  DP Palpable Palpable  Gastrointestinal: soft, non-distended. No guarding/no peritoneal signs.  Musculoskeletal: M/S 5/5 throughout.  No deformity or atrophy.  Neurologic: CN 2-12 intact. Pain and light touch intact in extremities.  Symmetrical.  Speech is fluent. Motor exam as listed above. Psychiatric: Judgment intact, Mood & affect appropriate for pt's clinical situation. Dermatologic: Venous rashes no ulcers noted.  No changes consistent with cellulitis. Lymph : + lichenification or skin changes of chronic lymphedema.  CBC Lab Results  Component Value Date   WBC 11.2 (H) 11/26/2020   HGB 13.0 11/26/2020   HCT 39.4 11/26/2020   MCV 89 11/26/2020   PLT 347 11/26/2020    BMET    Component Value Date/Time   NA 140 11/26/2020 1548   NA 141 07/31/2014 1024   K 4.2 11/26/2020 1548   K 3.6 07/31/2014 1024   CL 99 11/26/2020 1548   CL 105 07/31/2014 1024   CO2 22 11/26/2020 1548   CO2 26 07/31/2014 1024   GLUCOSE 108 (H) 11/26/2020 1548   GLUCOSE 99 08/09/2019 1545   GLUCOSE 108 (H) 07/31/2014 1024   BUN 9 11/26/2020 1548   BUN 9 07/31/2014 1024    CREATININE 0.84 11/26/2020 1548   CREATININE 0.73 07/31/2014 1024   CALCIUM 10.0 11/26/2020 1548   CALCIUM 8.7 07/31/2014 1024   GFRNONAA >60 08/09/2019 1545   GFRNONAA >60 07/31/2014 1024   GFRAA >60 08/09/2019 1545   GFRAA >60 07/31/2014 1024   CrCl cannot be calculated (Unknown ideal weight.).  COAG No results found for: INR, PROTIME  Radiology No results found.   Assessment/Plan 1. Chronic venous insufficiency I have had a long discussion with the patient regarding swelling and why it  causes symptoms.  Patient will begin wearing graduated compression stockings class 1 (20-30 mmHg) on a daily basis a prescription was given. The patient will  beginning wearing the stockings first thing in the morning and removing them in the evening. The patient is instructed specifically not to sleep in the stockings.   In  addition, behavioral modification will be initiated.  This will include frequent elevation, use of over the counter pain medications and exercise such as walking.  I have reviewed systemic causes for chronic edema such as liver, kidney and cardiac etiologies.  The patient denies problems with these organ systems.    Consideration for a lymph pump will also be made based upon the effectiveness of conservative therapy.  This would help to improve the edema control and prevent sequela such as ulcers and infections   Patient should undergo duplex ultrasound of the venous system to ensure that DVT or reflux is not present.  The patient will follow-up with me after the ultrasound.   - VAS [place] LOWER EXTREMITY VENOUS REFLUX; Future  2. Lymphedema I have had a long discussion with the patient regarding swelling and why it  causes symptoms.  Patient will begin wearing graduated compression stockings class 1 (20-30 mmHg) on a daily basis a prescription was given. The patient will  beginning wearing the stockings first thing in the morning and removing them in the evening. The patient is  instructed specifically not to sleep in the stockings.   In addition, behavioral modification will be initiated.  This will include frequent elevation, use of over the counter pain medications and exercise such as walking.  I have reviewed systemic causes for chronic edema such as liver, kidney and cardiac etiologies.  The patient denies problems with these organ systems.    Consideration for a lymph pump will also be made based upon the effectiveness of conservative therapy.  This would help to improve the edema control and prevent sequela such as ulcers and infections   Patient should undergo duplex ultrasound of the venous system to ensure that DVT or reflux is not present.  The patient will follow-up with me after the ultrasound.   - VAS [place] LOWER EXTREMITY VENOUS REFLUX; Future  3. Varicose veins of leg with edema, bilateral  Recommend:  The patient has large symptomatic varicose veins that are painful and associated with swelling.  I have had a long discussion with the patient regarding  varicose veins and why they cause symptoms.  Patient will begin wearing graduated compression stockings class 1 on a daily basis, beginning first thing in the morning and removing them in the evening. The patient is instructed specifically not to sleep in the stockings.    The patient  will also begin using over-the-counter analgesics such as Motrin 600 mg po TID to help control the symptoms.    In addition, behavioral modification including elevation during the day will be initiated.    Pending the results of these changes the  patient will be reevaluated in three months.   An  ultrasound of the venous system will be obtained.   Further plans will be based on the ultrasound results and whether conservative therapies are successful at eliminating the pain and swelling.   4. Carotid atherosclerosis, bilateral Recommend:  Given the patient's asymptomatic subcritical stenosis no further invasive  testing or surgery at this time.  Continue antiplatelet therapy as prescribed Continue management of CAD, HTN and Hyperlipidemia Healthy heart diet,  encouraged exercise at least 4 times per week Follow up in 6 months with duplex ultrasound and physical exam   5. Stable angina pectoris ([place]) Continue cardiac and antihypertensive medications as already ordered and reviewed, no changes at this time.  Continue statin as ordered and reviewed, no changes at this time  Nitrates PRN for chest pain  6. Essential (primary) hypertension Continue antihypertensive medications as already ordered, these medications have been reviewed and there are no changes at this time.     Hortencia Pilar, MD  12/09/2020 12:40 PM

## 2020-12-10 ENCOUNTER — Ambulatory Visit (INDEPENDENT_AMBULATORY_CARE_PROVIDER_SITE_OTHER): Payer: No Typology Code available for payment source | Admitting: Vascular Surgery

## 2020-12-10 ENCOUNTER — Encounter (INDEPENDENT_AMBULATORY_CARE_PROVIDER_SITE_OTHER): Payer: Self-pay | Admitting: Vascular Surgery

## 2020-12-10 ENCOUNTER — Other Ambulatory Visit: Payer: Self-pay

## 2020-12-10 VITALS — BP 153/85 | HR 72 | Ht 68.0 in | Wt 239.0 lb

## 2020-12-10 DIAGNOSIS — I872 Venous insufficiency (chronic) (peripheral): Secondary | ICD-10-CM

## 2020-12-10 DIAGNOSIS — I6523 Occlusion and stenosis of bilateral carotid arteries: Secondary | ICD-10-CM

## 2020-12-10 DIAGNOSIS — I10 Essential (primary) hypertension: Secondary | ICD-10-CM

## 2020-12-10 DIAGNOSIS — I890 Lymphedema, not elsewhere classified: Secondary | ICD-10-CM

## 2020-12-10 DIAGNOSIS — I208 Other forms of angina pectoris: Secondary | ICD-10-CM

## 2020-12-10 DIAGNOSIS — I83893 Varicose veins of bilateral lower extremities with other complications: Secondary | ICD-10-CM

## 2020-12-14 ENCOUNTER — Encounter (INDEPENDENT_AMBULATORY_CARE_PROVIDER_SITE_OTHER): Payer: Self-pay | Admitting: Vascular Surgery

## 2020-12-27 ENCOUNTER — Other Ambulatory Visit: Payer: Self-pay | Admitting: Nurse Practitioner

## 2020-12-27 DIAGNOSIS — K219 Gastro-esophageal reflux disease without esophagitis: Secondary | ICD-10-CM

## 2020-12-27 DIAGNOSIS — E1165 Type 2 diabetes mellitus with hyperglycemia: Secondary | ICD-10-CM

## 2021-01-01 ENCOUNTER — Other Ambulatory Visit: Payer: Self-pay | Admitting: Hospice and Palliative Medicine

## 2021-01-01 DIAGNOSIS — F411 Generalized anxiety disorder: Secondary | ICD-10-CM

## 2021-01-01 MED ORDER — ALPRAZOLAM 0.5 MG PO TABS: ORAL_TABLET | ORAL | 0 refills | Status: DC

## 2021-01-01 NOTE — Progress Notes (Signed)
30 day script of alprazolam sent to pharmacy, will need to discuss weaning use of alprazolam with use of Ambien as well each night. Will discuss at next office visit later this month.

## 2021-01-21 ENCOUNTER — Other Ambulatory Visit: Payer: Self-pay | Admitting: Nurse Practitioner

## 2021-01-21 DIAGNOSIS — E1142 Type 2 diabetes mellitus with diabetic polyneuropathy: Secondary | ICD-10-CM

## 2021-01-23 ENCOUNTER — Other Ambulatory Visit: Payer: Self-pay

## 2021-01-23 ENCOUNTER — Encounter: Payer: Self-pay | Admitting: Hospice and Palliative Medicine

## 2021-01-23 ENCOUNTER — Ambulatory Visit: Payer: No Typology Code available for payment source | Admitting: Hospice and Palliative Medicine

## 2021-01-23 VITALS — BP 132/84 | HR 74 | Temp 98.2°F | Resp 16 | Ht 68.0 in | Wt 234.8 lb

## 2021-01-23 DIAGNOSIS — F411 Generalized anxiety disorder: Secondary | ICD-10-CM

## 2021-01-23 DIAGNOSIS — K219 Gastro-esophageal reflux disease without esophagitis: Secondary | ICD-10-CM | POA: Diagnosis not present

## 2021-01-23 DIAGNOSIS — E1142 Type 2 diabetes mellitus with diabetic polyneuropathy: Secondary | ICD-10-CM | POA: Diagnosis not present

## 2021-01-23 DIAGNOSIS — E1165 Type 2 diabetes mellitus with hyperglycemia: Secondary | ICD-10-CM

## 2021-01-23 DIAGNOSIS — R053 Chronic cough: Secondary | ICD-10-CM | POA: Diagnosis not present

## 2021-01-23 DIAGNOSIS — N3281 Overactive bladder: Secondary | ICD-10-CM

## 2021-01-23 MED ORDER — ALPRAZOLAM 0.5 MG PO TABS: ORAL_TABLET | ORAL | 0 refills | Status: DC

## 2021-01-23 MED ORDER — GLUCOSE BLOOD VI STRP: ORAL_STRIP | 3 refills | Status: DC

## 2021-01-23 MED ORDER — OXYBUTYNIN CHLORIDE ER 10 MG PO TB24: 10.0000 mg | ORAL_TABLET | Freq: Every day | ORAL | 0 refills | Status: DC

## 2021-01-23 MED ORDER — PANTOPRAZOLE SODIUM 40 MG PO TBEC: 40.0000 mg | DELAYED_RELEASE_TABLET | Freq: Every day | ORAL | 3 refills | Status: DC

## 2021-01-23 MED ORDER — GABAPENTIN 100 MG PO CAPS: 100.0000 mg | ORAL_CAPSULE | Freq: Two times a day (BID) | ORAL | 3 refills | Status: DC

## 2021-01-23 MED ORDER — LEVOCETIRIZINE DIHYDROCHLORIDE 5 MG PO TABS: 5.0000 mg | ORAL_TABLET | Freq: Every evening | ORAL | 0 refills | Status: DC

## 2021-01-23 MED ORDER — ROSUVASTATIN CALCIUM 5 MG PO TABS: 5.0000 mg | ORAL_TABLET | Freq: Every day | ORAL | 3 refills | Status: DC

## 2021-01-23 MED ORDER — FLUTICASONE PROPIONATE 50 MCG/ACT NA SUSP: 2.0000 | Freq: Every day | NASAL | 1 refills | Status: DC

## 2021-01-23 NOTE — Progress Notes (Signed)
[facility] [place], [place] [address]  Internal MEDICINE  Office Visit Note  Patient Name: Valerie Ramos  [id number]  [id number]  Date of Service: 01/30/2021  Chief Complaint  Patient presents with  . Follow-up    Kidney function    HPI Patient is here for routine follow-up Has noticed an improvement in leg swelling, seen by vascular--now wearing compression stockings, will be having an ultrasound within their office at next follow-up  Complaining of an ongoing cough, persistent, at times dry hacking cough and at other times it is slightly productive--green mucous Denies any further URI symptoms  Continues to complain of feeling as thought she cannot completely empty her bladder and frequently urinates throughout the night Denies burning with urination or abdominal pain   Current Medication: Outpatient Encounter Medications as of 01/23/2021  Medication Sig  . acetaminophen (TYLENOL) 325 MG tablet Take 2 tablets (650 mg total) by mouth every 6 (six) hours as needed for mild pain (or Fever >/= 101).  Marland Kitchen atenolol (TENORMIN) 25 MG tablet Take 1/2 tablet po every evening  . cyclobenzaprine (FLEXERIL) 5 MG tablet Take 5 mg by mouth at bedtime as needed.  . DULoxetine (CYMBALTA) 30 MG capsule Take 30 mg by mouth daily.  Marland Kitchen estrogens, conjugated, (PREMARIN) 0.9 MG tablet 1 TABLET, COATED BY MOUTH ONCE A DAY  . fluticasone (FLONASE) 50 MCG/ACT nasal spray Place 2 sprays into both nostrils daily.  . hydrochlorothiazide (HYDRODIURIL) 25 MG tablet TAKE 1 TABLET(S) BY MOUTH DAILY FOR BLOOD PRESSURE/EDEMA  . ibuprofen (ADVIL) 600 MG tablet TAKE 1 TABLET BY MOUTH EVERY 8 HOURS AS NEEDED.  Marland Kitchen levocetirizine (XYZAL) 5 MG tablet Take 1 tablet (5 mg total) by mouth every evening.  Marland Kitchen losartan (COZAAR) 25 MG tablet Take 2 tablets (50 mg total) by mouth daily.  . meloxicam (MOBIC) 7.5 MG tablet TAKE 1 TABLET BY MOUTH EVERY DAY  . metFORMIN (GLUCOPHAGE) 500 MG tablet Take 1  tablet (500 mg total) by mouth 2 (two) times daily with a meal.  . metoCLOPramide (REGLAN) 10 MG tablet TAKE 1 TABLET THREE TIMES A DAY AS NEEDED FOR REFLUX  . OneTouch Delica Lancets 44R MISC 1 each by Does not apply route daily. Use as directed once daily diag e11.65  . tiZANidine (ZANAFLEX) 2 MG tablet Take 1 tablet (2 mg total) by mouth 2 (two) times daily as needed for muscle spasms.  Marland Kitchen zolpidem (AMBIEN) 10 MG tablet Take 1 tablet (10 mg total) by mouth at bedtime as needed for sleep.  . [DISCONTINUED] ALPRAZolam (XANAX) 0.5 MG tablet TAKE 1 TABLET BY MOUTH TWICE A DAY AS NEEDED FOR ANXIETY  . [DISCONTINUED] gabapentin (NEURONTIN) 100 MG capsule Take 1 capsule po every evening. May increase to twice daily as needed and as tolerated .  . [DISCONTINUED] glucose blood (ONE TOUCH ULTRA TEST) test strip FOR ONCE DAILY TESTING DX. E11.65  . [DISCONTINUED] nitrofurantoin, macrocrystal-monohydrate, (MACROBID) 100 MG capsule Take 1 capsule (100 mg total) by mouth 2 (two) times daily.  . [DISCONTINUED] oxybutynin (DITROPAN-XL) 10 MG 24 hr tablet Take 10 mg by mouth at bedtime.  . [DISCONTINUED] pantoprazole (PROTONIX) 40 MG tablet Take 1 tablet (40 mg total) by mouth daily.  . [DISCONTINUED] rosuvastatin (CRESTOR) 5 MG tablet Take 1 tablet (5 mg total) by mouth daily.  Marland Kitchen ALPRAZolam (XANAX) 0.5 MG tablet TAKE 1 TABLET BY MOUTH TWICE A DAY AS NEEDED FOR ANXIETY  . famotidine (PEPCID) 20 MG tablet Take 1 tablet (20  mg total) by mouth 2 (two) times daily.  Marland Kitchen gabapentin (NEURONTIN) 100 MG capsule Take 1 capsule (100 mg total) by mouth 2 (two) times daily.  Marland Kitchen glucose blood (ONE TOUCH ULTRA TEST) test strip FOR ONCE DAILY TESTING DX. E11.65  . oxybutynin (DITROPAN-XL) 10 MG 24 hr tablet Take 1 tablet (10 mg total) by mouth at bedtime.  . pantoprazole (PROTONIX) 40 MG tablet Take 1 tablet (40 mg total) by mouth daily.  . rosuvastatin (CRESTOR) 5 MG tablet Take 1 tablet (5 mg total) by mouth daily.   No  facility-administered encounter medications on file as of 01/23/2021.    Surgical History: Past Surgical History:  Procedure Laterality Date  . ABDOMINAL HYSTERECTOMY    . CESAREAN SECTION  1989  . CYSTECTOMY  2000   Ovarian  . OOPHORECTOMY    . OVARIAN CYST SURGERY  2006  . RECTOVAGINAL FISTULA CLOSURE  1980    Medical History: Past Medical History:  Diagnosis Date  . Anxiety   . Arthritis   . Depression   . Diabetes 1.5, managed as type 2 ([place])   . Hypertension   . Insomnia   . Migraines   . Reflux   . Sinusitis     Family History: Family History  Problem Relation Age of Onset  . Hodgkin's lymphoma Mother   . Diabetes Mother   . Thyroid disease Mother   . Glaucoma Mother   . Stroke Brother   . Heart disease Brother     Social History   Socioeconomic History  . Marital status: Married    Spouse name: Not on file  . Number of children: Not on file  . Years of education: Not on file  . Highest education level: Not on file  Occupational History  . Not on file  Tobacco Use  . Smoking status: Former Smoker    Types: Cigarettes    Quit date: 05/24/2011    Years since quitting: 9.6  . Smokeless tobacco: Never Used  Substance and Sexual Activity  . Alcohol use: No  . Drug use: No  . Sexual activity: Not on file  Other Topics Concern  . Not on file  Social History Narrative  . Not on file   Social Determinants of Health   Financial Resource Strain: Not on file  Food Insecurity: Not on file  Transportation Needs: Not on file  Physical Activity: Not on file  Stress: Not on file  Social Connections: Not on file  Intimate Partner Violence: Not on file      Review of Systems  Constitutional: Negative for chills, diaphoresis and fatigue.  HENT: Negative for ear pain, postnasal drip and sinus pressure.   Eyes: Negative for photophobia, discharge, redness, itching and visual disturbance.  Respiratory: Positive for cough. Negative for shortness of breath  and wheezing.   Cardiovascular: Negative for chest pain, palpitations and leg swelling.  Gastrointestinal: Negative for abdominal pain, constipation, diarrhea, nausea and vomiting.  Genitourinary: Negative for dysuria and flank pain.  Musculoskeletal: Negative for arthralgias, back pain, gait problem and neck pain.  Skin: Negative for color change.  Allergic/Immunologic: Negative for environmental allergies and food allergies.  Neurological: Negative for dizziness and headaches.  Hematological: Does not bruise/bleed easily.  Psychiatric/Behavioral: Negative for agitation, behavioral problems (depression) and hallucinations.    Vital Signs: BP 132/84   Pulse 74   Temp 98.2 F (36.8 C)   Resp 16   Ht 5\' 8"  (1.727 m)   Wt 234 lb 12.8  oz (106.5 kg)   SpO2 95%   BMI 35.70 kg/m    Physical Exam Vitals reviewed.  Constitutional:      Appearance: Normal appearance. She is obese.  Cardiovascular:     Rate and Rhythm: Normal rate and regular rhythm.     Pulses: Normal pulses.     Heart sounds: Normal heart sounds.  Pulmonary:     Effort: Pulmonary effort is normal.     Breath sounds: Normal breath sounds.  Abdominal:     General: Abdomen is flat.     Palpations: Abdomen is soft.  Musculoskeletal:        General: Normal range of motion.     Cervical back: Normal range of motion.  Skin:    General: Skin is warm.  Neurological:     General: No focal deficit present.     Mental Status: She is alert and oriented to person, place, and time. Mental status is at baseline.  Psychiatric:        Mood and Affect: Mood normal.        Behavior: Behavior normal.        Thought Content: Thought content normal.        Judgment: Judgment normal.    Assessment/Plan: 1. Persistent cough Optimize allergy regimen If symptoms persist consider adjust BP medication due to ARB use - levocetirizine (XYZAL) 5 MG tablet; Take 1 tablet (5 mg total) by mouth every evening.  Dispense: 90 tablet;  Refill: 0 - fluticasone (FLONASE) 50 MCG/ACT nasal spray; Place 2 sprays into both nostrils daily.  Dispense: 16 g; Refill: 1  2. Diabetic polyneuropathy associated with type 2 diabetes mellitus (HCC) Stable, requesting refills - gabapentin (NEURONTIN) 100 MG capsule; Take 1 capsule (100 mg total) by mouth 2 (two) times daily.  Dispense: 60 capsule; Refill: 3 - rosuvastatin (CRESTOR) 5 MG tablet; Take 1 tablet (5 mg total) by mouth daily.  Dispense: 30 tablet; Refill: 3 - glucose blood (ONE TOUCH ULTRA TEST) test strip; FOR ONCE DAILY TESTING DX. E11.65  Dispense: 100 each; Refill: 3  3. Gastro-esophageal reflux disease without esophagitis Symptoms remain well controlled, requesting refills - pantoprazole (PROTONIX) 40 MG tablet; Take 1 tablet (40 mg total) by mouth daily.  Dispense: 90 tablet; Refill: 3  4. Generalized anxiety disorder May continue with alprazolam as needed [place] Controlled Substance Database was reviewed by me for overdose risk score (ORS) Reviewed risks and possible side effects associated with taking opiates, benzodiazepines and other CNS depressants. Combination of these could cause dizziness and drowsiness. Advised patient not to drive or operate machinery when taking these medications, as patient's and other's life can be at risk and will have consequences. Patient verbalized understanding in this matter. Dependence and abuse for these drugs will be monitored closely. A Controlled substance policy and procedure is on file which allows [facility] to order a urine drug screen test at any visit. Patient understands and agrees with the plan - ALPRAZolam (XANAX) 0.5 MG tablet; TAKE 1 TABLET BY MOUTH TWICE A DAY AS NEEDED FOR ANXIETY  Dispense: 60 tablet; Refill: 0  5. Overactive bladder Trial oxybutynin for symptoms of OAB, if no improvement in symptoms may need to consider bladder US - oxybutynin (DITROPAN-XL) 10 MG 24 hr tablet; Take 1 tablet (10 mg total) by mouth  at bedtime.  Dispense: 90 tablet; Refill: 0  General Counseling: Valerie Ramos verbalizes understanding of the findings of todays visit and agrees with plan of treatment. I have discussed any  further diagnostic evaluation that may be needed or ordered today. We also reviewed her medications today. she has been encouraged to call the office with any questions or concerns that should arise related to todays visit.   Meds ordered this encounter  Medications  . levocetirizine (XYZAL) 5 MG tablet    Sig: Take 1 tablet (5 mg total) by mouth every evening.    Dispense:  90 tablet    Refill:  0  . fluticasone (FLONASE) 50 MCG/ACT nasal spray    Sig: Place 2 sprays into both nostrils daily.    Dispense:  16 g    Refill:  1  . pantoprazole (PROTONIX) 40 MG tablet    Sig: Take 1 tablet (40 mg total) by mouth daily.    Dispense:  90 tablet    Refill:  3  . ALPRAZolam (XANAX) 0.5 MG tablet    Sig: TAKE 1 TABLET BY MOUTH TWICE A DAY AS NEEDED FOR ANXIETY    Dispense:  60 tablet    Refill:  0    Not to exceed 5 additional fills before 05/01/2021  . gabapentin (NEURONTIN) 100 MG capsule    Sig: Take 1 capsule (100 mg total) by mouth 2 (two) times daily.    Dispense:  60 capsule    Refill:  3  . rosuvastatin (CRESTOR) 5 MG tablet    Sig: Take 1 tablet (5 mg total) by mouth daily.    Dispense:  30 tablet    Refill:  3  . glucose blood (ONE TOUCH ULTRA TEST) test strip    Sig: FOR ONCE DAILY TESTING DX. E11.65    Dispense:  100 each    Refill:  3  . oxybutynin (DITROPAN-XL) 10 MG 24 hr tablet    Sig: Take 1 tablet (10 mg total) by mouth at bedtime.    Dispense:  90 tablet    Refill:  0    Time spent: 30 Minutes Time spent includes review of chart, medications, test results and follow-up plan with the patient.  This patient was seen by Theodoro Grist AGNP-C in Collaboration with Dr Lavera Guise as a part of collaborative care agreement     Tanna Furry. Augustine Brannick AGNP-C Internal medicine

## 2021-01-30 ENCOUNTER — Encounter: Payer: Self-pay | Admitting: Hospice and Palliative Medicine

## 2021-02-05 ENCOUNTER — Telehealth: Payer: Self-pay

## 2021-02-05 NOTE — Telephone Encounter (Signed)
Spoke with pt, informed her that I spoke with [facility] and attached her insurance to the labs, and added any codes that may have been missing. Informed her it can take about 30 or so days for them to file claims to insurance.

## 2021-02-15 ENCOUNTER — Other Ambulatory Visit: Payer: Self-pay

## 2021-02-15 DIAGNOSIS — R053 Chronic cough: Secondary | ICD-10-CM

## 2021-02-15 MED ORDER — FLUTICASONE PROPIONATE 50 MCG/ACT NA SUSP: 2.0000 | Freq: Every day | NASAL | 1 refills | Status: DC

## 2021-02-16 ENCOUNTER — Other Ambulatory Visit: Payer: Self-pay | Admitting: Internal Medicine

## 2021-02-16 DIAGNOSIS — M25511 Pain in right shoulder: Secondary | ICD-10-CM

## 2021-02-16 DIAGNOSIS — M5000 Cervical disc disorder with myelopathy, unspecified cervical region: Secondary | ICD-10-CM

## 2021-02-21 ENCOUNTER — Encounter: Payer: Self-pay | Admitting: Physician Assistant

## 2021-02-21 ENCOUNTER — Ambulatory Visit: Payer: No Typology Code available for payment source | Admitting: Physician Assistant

## 2021-02-21 ENCOUNTER — Other Ambulatory Visit: Payer: Self-pay

## 2021-02-21 DIAGNOSIS — K219 Gastro-esophageal reflux disease without esophagitis: Secondary | ICD-10-CM

## 2021-02-21 DIAGNOSIS — E1142 Type 2 diabetes mellitus with diabetic polyneuropathy: Secondary | ICD-10-CM

## 2021-02-21 DIAGNOSIS — R053 Chronic cough: Secondary | ICD-10-CM

## 2021-02-21 DIAGNOSIS — N959 Unspecified menopausal and perimenopausal disorder: Secondary | ICD-10-CM

## 2021-02-21 DIAGNOSIS — N3281 Overactive bladder: Secondary | ICD-10-CM | POA: Diagnosis not present

## 2021-02-21 DIAGNOSIS — J0100 Acute maxillary sinusitis, unspecified: Secondary | ICD-10-CM | POA: Diagnosis not present

## 2021-02-21 DIAGNOSIS — F411 Generalized anxiety disorder: Secondary | ICD-10-CM

## 2021-02-21 DIAGNOSIS — M546 Pain in thoracic spine: Secondary | ICD-10-CM

## 2021-02-21 MED ORDER — OXYBUTYNIN CHLORIDE ER 10 MG PO TB24: 10.0000 mg | ORAL_TABLET | Freq: Every day | ORAL | 0 refills | Status: DC

## 2021-02-21 MED ORDER — TIZANIDINE HCL 2 MG PO TABS: 2.0000 mg | ORAL_TABLET | Freq: Two times a day (BID) | ORAL | 3 refills | Status: DC | PRN

## 2021-02-21 MED ORDER — PANTOPRAZOLE SODIUM 40 MG PO TBEC: 40.0000 mg | DELAYED_RELEASE_TABLET | Freq: Every day | ORAL | 3 refills | Status: DC

## 2021-02-21 MED ORDER — ESTROGENS CONJUGATED 0.9 MG PO TABS: ORAL_TABLET | ORAL | 2 refills | Status: DC

## 2021-02-21 MED ORDER — ALPRAZOLAM 0.5 MG PO TABS: ORAL_TABLET | ORAL | 0 refills | Status: DC

## 2021-02-21 MED ORDER — AMOXICILLIN-POT CLAVULANATE 875-125 MG PO TABS: 1.0000 | ORAL_TABLET | Freq: Two times a day (BID) | ORAL | 0 refills | Status: DC

## 2021-02-21 NOTE — Progress Notes (Signed)
[facility] [place], [place] [address]  Internal MEDICINE  Office Visit Note  Patient Name: Valerie Ramos  [id number]  [id number]  Date of Service: 02/21/2021  Chief Complaint  Patient presents with  . Anxiety  . Cough    4 week f-up.  Was having drainage, put on nasal spray.  Still having problems with drainage, feels on left side, has issues with bringing up the mucus    HPI Pt is here for follow up -She has had a cough for awhile, tested negative for covid multiple times. Last visit started on flonase and xyzal. Drainage is only on the left side. Cycles off and on throughout the day but happens daily. Has been on losartan less than a year but does not describe a dry cough that would lead to think that this is due to medication, however may need to consider switching if continues. Cough/drainage not associated with meal times. Her kids have many sinus issues, but she never really had this in the past. Cough for the last month.  -Discussed considering a CT sinus/ENT referral, or get a CXR, but pt wants to hold off at this time and will call if worsening or changes her mind about further management -Oxybutynin has helped a lot, does not get up at night to bathroom anymore and no incontinence. -needs premarin resent, has been on this since early 2000s--hx of hysterectomy in late 1990s  Current Medication: Outpatient Encounter Medications as of 02/21/2021  Medication Sig  . acetaminophen (TYLENOL) 325 MG tablet Take 2 tablets (650 mg total) by mouth every 6 (six) hours as needed for mild pain (or Fever >/= 101).  Marland Kitchen amoxicillin-clavulanate (AUGMENTIN) 875-125 MG tablet Take 1 tablet by mouth 2 (two) times daily.  Marland Kitchen atenolol (TENORMIN) 25 MG tablet Take 1/2 tablet po every evening  . cyclobenzaprine (FLEXERIL) 5 MG tablet Take 5 mg by mouth at bedtime as needed.  . DULoxetine (CYMBALTA) 30 MG capsule Take 30 mg by mouth daily.  . fluticasone (FLONASE) 50 MCG/ACT  nasal spray Place 2 sprays into both nostrils daily.  Marland Kitchen gabapentin (NEURONTIN) 100 MG capsule Take 1 capsule (100 mg total) by mouth 2 (two) times daily.  Marland Kitchen glucose blood (ONE TOUCH ULTRA TEST) test strip FOR ONCE DAILY TESTING DX. E11.65  . hydrochlorothiazide (HYDRODIURIL) 25 MG tablet TAKE 1 TABLET(S) BY MOUTH DAILY FOR BLOOD PRESSURE/EDEMA  . ibuprofen (ADVIL) 600 MG tablet Take 1 tablet (600 mg total) by mouth every 8 (eight) hours as needed. ( Do not take this product on a regular basis )  . levocetirizine (XYZAL) 5 MG tablet Take 1 tablet (5 mg total) by mouth every evening.  Marland Kitchen losartan (COZAAR) 25 MG tablet Take 2 tablets (50 mg total) by mouth daily.  . meloxicam (MOBIC) 7.5 MG tablet TAKE 1 TABLET BY MOUTH EVERY DAY  . metFORMIN (GLUCOPHAGE) 500 MG tablet Take 1 tablet (500 mg total) by mouth 2 (two) times daily with a meal.  . metoCLOPramide (REGLAN) 10 MG tablet TAKE 1 TABLET THREE TIMES A DAY AS NEEDED FOR REFLUX  . OneTouch Delica Lancets 42P MISC 1 each by Does not apply route daily. Use as directed once daily diag e11.65  . rosuvastatin (CRESTOR) 5 MG tablet Take 1 tablet (5 mg total) by mouth daily.  Marland Kitchen zolpidem (AMBIEN) 10 MG tablet Take 1 tablet (10 mg total) by mouth at bedtime as needed for sleep.  . [DISCONTINUED] ALPRAZolam (XANAX) 0.5 MG tablet TAKE 1  TABLET BY MOUTH TWICE A DAY AS NEEDED FOR ANXIETY  . [DISCONTINUED] estrogens, conjugated, (PREMARIN) 0.9 MG tablet 1 TABLET, COATED BY MOUTH ONCE A DAY  . [DISCONTINUED] oxybutynin (DITROPAN-XL) 10 MG 24 hr tablet Take 1 tablet (10 mg total) by mouth at bedtime.  . [DISCONTINUED] pantoprazole (PROTONIX) 40 MG tablet Take 1 tablet (40 mg total) by mouth daily.  . [DISCONTINUED] tiZANidine (ZANAFLEX) 2 MG tablet Take 1 tablet (2 mg total) by mouth 2 (two) times daily as needed for muscle spasms.  Derrill Memo ON 02/28/2021] ALPRAZolam (XANAX) 0.5 MG tablet TAKE 1 TABLET BY MOUTH TWICE A DAY AS NEEDED FOR ANXIETY  . estrogens,  conjugated, (PREMARIN) 0.9 MG tablet 1 TABLET, COATED BY MOUTH ONCE A DAY  . famotidine (PEPCID) 20 MG tablet Take 1 tablet (20 mg total) by mouth 2 (two) times daily.  Marland Kitchen oxybutynin (DITROPAN-XL) 10 MG 24 hr tablet Take 1 tablet (10 mg total) by mouth at bedtime.  . pantoprazole (PROTONIX) 40 MG tablet Take 1 tablet (40 mg total) by mouth daily.  Marland Kitchen tiZANidine (ZANAFLEX) 2 MG tablet Take 1 tablet (2 mg total) by mouth 2 (two) times daily as needed for muscle spasms.   No facility-administered encounter medications on file as of 02/21/2021.    Surgical History: Past Surgical History:  Procedure Laterality Date  . ABDOMINAL HYSTERECTOMY    . CESAREAN SECTION  1989  . CYSTECTOMY  2000   Ovarian  . OOPHORECTOMY    . OVARIAN CYST SURGERY  2006  . RECTOVAGINAL FISTULA CLOSURE  1980    Medical History: Past Medical History:  Diagnosis Date  . Anxiety   . Arthritis   . Depression   . Diabetes 1.5, managed as type 2 ([place])   . Hypertension   . Insomnia   . Migraines   . Reflux   . Sinusitis     Family History: Family History  Problem Relation Age of Onset  . Hodgkin's lymphoma Mother   . Diabetes Mother   . Thyroid disease Mother   . Glaucoma Mother   . Stroke Brother   . Heart disease Brother     Social History   Socioeconomic History  . Marital status: Married    Spouse name: Not on file  . Number of children: Not on file  . Years of education: Not on file  . Highest education level: Not on file  Occupational History  . Not on file  Tobacco Use  . Smoking status: Former Smoker    Types: Cigarettes    Quit date: 05/24/2011    Years since quitting: 9.7  . Smokeless tobacco: Never Used  Substance and Sexual Activity  . Alcohol use: No  . Drug use: No  . Sexual activity: Not on file  Other Topics Concern  . Not on file  Social History Narrative  . Not on file   Social Determinants of Health   Financial Resource Strain: Not on file  Food Insecurity: Not on  file  Transportation Needs: Not on file  Physical Activity: Not on file  Stress: Not on file  Social Connections: Not on file  Intimate Partner Violence: Not on file      Review of Systems  Constitutional: Negative for chills, fatigue and unexpected weight change.  HENT: Positive for postnasal drip and rhinorrhea. Negative for congestion, sneezing and sore throat.   Eyes: Negative for redness.  Respiratory: Positive for cough. Negative for chest tightness, shortness of breath and wheezing.  Cardiovascular: Negative for chest pain and palpitations.  Gastrointestinal: Negative for abdominal pain, constipation, diarrhea, nausea and vomiting.  Endocrine:       Hot flashes since running out of premarin  Genitourinary: Negative for dysuria and frequency.  Musculoskeletal: Negative for arthralgias, back pain, joint swelling and neck pain.  Skin: Negative for rash.  Neurological: Negative.  Negative for tremors and numbness.  Hematological: Negative for adenopathy. Does not bruise/bleed easily.  Psychiatric/Behavioral: Negative for behavioral problems (Depression), sleep disturbance and suicidal ideas. The patient is nervous/anxious.     Vital Signs: BP 124/74   Pulse 77   Temp 98 F (36.7 C)   Resp 16   Ht 5' 8.5" (1.74 m)   Wt 236 lb 3.2 oz (107.1 kg)   SpO2 95%   BMI 35.39 kg/m    Physical Exam Vitals and nursing note reviewed.  Constitutional:      General: She is not in acute distress.    Appearance: She is well-developed. She is obese. She is not diaphoretic.  HENT:     Head: Normocephalic and atraumatic.     Mouth/Throat:     Pharynx: No oropharyngeal exudate.  Eyes:     Pupils: Pupils are equal, round, and reactive to light.  Neck:     Thyroid: No thyromegaly.     Vascular: No JVD.     Trachea: No tracheal deviation.  Cardiovascular:     Rate and Rhythm: Normal rate and regular rhythm.     Heart sounds: Normal heart sounds. No murmur heard. No friction rub.  No gallop.   Pulmonary:     Effort: Pulmonary effort is normal. No respiratory distress.     Breath sounds: No wheezing or rales.  Chest:     Chest wall: No tenderness.  Abdominal:     General: Bowel sounds are normal.     Palpations: Abdomen is soft.  Musculoskeletal:        General: Normal range of motion.     Cervical back: Normal range of motion and neck supple.  Lymphadenopathy:     Cervical: No cervical adenopathy.  Skin:    General: Skin is warm and dry.  Neurological:     Mental Status: She is alert and oriented to person, place, and time.     Cranial Nerves: No cranial nerve deficit.  Psychiatric:        Behavior: Behavior normal.        Thought Content: Thought content normal.        Judgment: Judgment normal.        Assessment/Plan: 1. Acute non-recurrent maxillary sinusitis Due to ongoing congestion as well as development of ear pressure we will go ahead and treat for sinusitis with Augmentin, educated to take with food.  Patient should also take Mucinex and use Flonase and continue Xyzal. - amoxicillin-clavulanate (AUGMENTIN) 875-125 MG tablet; Take 1 tablet by mouth 2 (two) times daily.  Dispense: 20 tablet; Refill: 0  2. Persistent cough Will start him on Augmentin to treat for sinusitis, patient should utilize Mucinex for any congestion, continue Xyzal and Flonase for control of allergies and postnasal drip.  Will consider stopping losartan if cough continues without any other cause.  We will also consider obtaining a chest x-ray or ENT consult/CT sinuses for further investigation of left-sided nasal drainage contributing to cough.  3. Overactive bladder Doing well on oxybutynin, requesting refill at this time - oxybutynin (DITROPAN-XL) 10 MG 24 hr tablet; Take 1 tablet (10 mg total)  by mouth at bedtime.  Dispense: 90 tablet; Refill: 0  4. Diabetic polyneuropathy associated with type 2 diabetes mellitus ([place]) Patient doing well on gabapentin and will  continue  5. Generalized anxiety disorder Stable, patient will continue to use Xanax to twice a day as needed for acute anxiety - ALPRAZolam (XANAX) 0.5 MG tablet; TAKE 1 TABLET BY MOUTH TWICE A DAY AS NEEDED FOR ANXIETY  Dispense: 60 tablet; Refill: 0 [place] Controlled Substance Database was reviewed by me for overdose risk score (ORS) Reviewed risks and possible side effects associated with taking opiates, benzodiazepines and other CNS depressants. Combination of these could cause dizziness and drowsiness. Advised patient not to drive or operate machinery when taking these medications, as patient's and other's life can be at risk and will have consequences. Patient verbalized understanding in this matter. Dependence and abuse for these drugs will be monitored closely. A Controlled substance policy and procedure is on file which allows [facility] to order a urine drug screen test at any visit. Patient understands and agrees with the plan 6. Unspecified menopausal and perimenopausal disorder Hot flashes since running out of Premarin, requesting refill today--patient given information about coupon savings - estrogens, conjugated, (PREMARIN) 0.9 MG tablet; 1 TABLET, COATED BY MOUTH ONCE A DAY  Dispense: 90 tablet; Refill: 2  7. Gastro-esophageal reflux disease without esophagitis Stable, requesting refill of current pantoprazole - pantoprazole (PROTONIX) 40 MG tablet; Take 1 tablet (40 mg total) by mouth daily.  Dispense: 90 tablet; Refill: 3  8. Dorsalgia of thoracic region Patient requesting refill on tizanidine at this time for ongoing back pain - tiZANidine (ZANAFLEX) 2 MG tablet; Take 1 tablet (2 mg total) by mouth 2 (two) times daily as needed for muscle spasms.  Dispense: 45 tablet; Refill: 3   General Counseling: Leeba verbalizes understanding of the findings of todays visit and agrees with plan of treatment. I have discussed any further diagnostic evaluation that may be needed or  ordered today. We also reviewed her medications today. she has been encouraged to call the office with any questions or concerns that should arise related to todays visit.    No orders of the defined types were placed in this encounter.   Meds ordered this encounter  Medications  . estrogens, conjugated, (PREMARIN) 0.9 MG tablet    Sig: 1 TABLET, COATED BY MOUTH ONCE A DAY    Dispense:  90 tablet    Refill:  2    Please change to alternative preferred by patient's insurance so not so expensive.  Marland Kitchen oxybutynin (DITROPAN-XL) 10 MG 24 hr tablet    Sig: Take 1 tablet (10 mg total) by mouth at bedtime.    Dispense:  90 tablet    Refill:  0  . pantoprazole (PROTONIX) 40 MG tablet    Sig: Take 1 tablet (40 mg total) by mouth daily.    Dispense:  90 tablet    Refill:  3  . tiZANidine (ZANAFLEX) 2 MG tablet    Sig: Take 1 tablet (2 mg total) by mouth 2 (two) times daily as needed for muscle spasms.    Dispense:  45 tablet    Refill:  3  . ALPRAZolam (XANAX) 0.5 MG tablet    Sig: TAKE 1 TABLET BY MOUTH TWICE A DAY AS NEEDED FOR ANXIETY    Dispense:  60 tablet    Refill:  0    Not to exceed 5 additional fills before 05/01/2021  . amoxicillin-clavulanate (AUGMENTIN) 875-125 MG tablet  Sig: Take 1 tablet by mouth 2 (two) times daily.    Dispense:  20 tablet    Refill:  0    This patient was seen by Drema Dallas, PA-C in collaboration with Dr. Clayborn Bigness as a part of collaborative care agreement.   Total time spent:40 Minutes Time spent includes review of chart, medications, test results, and follow up plan with the patient.      Dr Lavera Guise Internal medicine

## 2021-03-04 ENCOUNTER — Other Ambulatory Visit: Payer: Self-pay | Admitting: Nurse Practitioner

## 2021-03-04 ENCOUNTER — Other Ambulatory Visit: Payer: Self-pay

## 2021-03-04 DIAGNOSIS — K219 Gastro-esophageal reflux disease without esophagitis: Secondary | ICD-10-CM

## 2021-03-04 MED ORDER — METOCLOPRAMIDE HCL 10 MG PO TABS: ORAL_TABLET | ORAL | 0 refills | Status: DC

## 2021-03-14 ENCOUNTER — Other Ambulatory Visit: Payer: Self-pay | Admitting: Internal Medicine

## 2021-03-14 DIAGNOSIS — R053 Chronic cough: Secondary | ICD-10-CM

## 2021-03-16 ENCOUNTER — Other Ambulatory Visit: Payer: Self-pay | Admitting: Internal Medicine

## 2021-03-16 DIAGNOSIS — M546 Pain in thoracic spine: Secondary | ICD-10-CM

## 2021-03-21 ENCOUNTER — Other Ambulatory Visit: Payer: Self-pay | Admitting: Physician Assistant

## 2021-03-21 ENCOUNTER — Telehealth: Payer: Self-pay

## 2021-03-21 ENCOUNTER — Other Ambulatory Visit: Payer: Self-pay | Admitting: Internal Medicine

## 2021-03-21 ENCOUNTER — Ambulatory Visit (INDEPENDENT_AMBULATORY_CARE_PROVIDER_SITE_OTHER): Payer: No Typology Code available for payment source | Admitting: Vascular Surgery

## 2021-03-21 ENCOUNTER — Other Ambulatory Visit: Payer: Self-pay

## 2021-03-21 ENCOUNTER — Other Ambulatory Visit: Payer: Self-pay | Admitting: Nurse Practitioner

## 2021-03-21 ENCOUNTER — Ambulatory Visit (INDEPENDENT_AMBULATORY_CARE_PROVIDER_SITE_OTHER): Payer: No Typology Code available for payment source

## 2021-03-21 ENCOUNTER — Encounter (INDEPENDENT_AMBULATORY_CARE_PROVIDER_SITE_OTHER): Payer: Self-pay | Admitting: Vascular Surgery

## 2021-03-21 VITALS — BP 163/80 | HR 72 | Ht 68.0 in | Wt 237.0 lb

## 2021-03-21 DIAGNOSIS — I83893 Varicose veins of bilateral lower extremities with other complications: Secondary | ICD-10-CM

## 2021-03-21 DIAGNOSIS — I10 Essential (primary) hypertension: Secondary | ICD-10-CM

## 2021-03-21 DIAGNOSIS — F411 Generalized anxiety disorder: Secondary | ICD-10-CM

## 2021-03-21 DIAGNOSIS — I6523 Occlusion and stenosis of bilateral carotid arteries: Secondary | ICD-10-CM | POA: Diagnosis not present

## 2021-03-21 DIAGNOSIS — I890 Lymphedema, not elsewhere classified: Secondary | ICD-10-CM | POA: Diagnosis not present

## 2021-03-21 DIAGNOSIS — I872 Venous insufficiency (chronic) (peripheral): Secondary | ICD-10-CM

## 2021-03-21 DIAGNOSIS — R600 Localized edema: Secondary | ICD-10-CM

## 2021-03-21 MED ORDER — ATENOLOL 25 MG PO TABS: ORAL_TABLET | ORAL | 3 refills | Status: DC

## 2021-03-21 MED ORDER — ESTRADIOL 0.5 MG PO TABS: 0.5000 mg | ORAL_TABLET | Freq: Every day | ORAL | 3 refills | Status: DC

## 2021-03-21 NOTE — Telephone Encounter (Signed)
Pt advised  change med to low of estradiol

## 2021-03-21 NOTE — Telephone Encounter (Signed)
Change of rx is given, let pt know, she needs to try low dose and if it does not work, will go up on it

## 2021-03-21 NOTE — Progress Notes (Signed)
MRN : [id number]  Valerie Ramos is a 65 y.o. (Mar 25, 1956) female who presents with chief complaint of No chief complaint on file. Marland Kitchen  History of Present Illness:   The patient returns to the office for followup evaluation regarding leg swelling.  The swelling has persisted and the pain associated with swelling continues. There have not been any interval development of a ulcerations or wounds.  Since the previous visit the patient has been wearing graduated compression stockings and has noted little if any improvement in the lymphedema. The patient has been using compression routinely morning until night.  The patient also states elevation during the day and exercise is being done too.   Venous duplex shows normal deep system bilaterally with a segment of reflux in the right great saphenous vein, no reflux in the left great saphenous vein.  No outpatient medications have been marked as taking for the 03/21/21 encounter (Appointment) with Delana Meyer, Dolores Lory, MD.    Past Medical History:  Diagnosis Date   Anxiety    Arthritis    Depression    Diabetes 1.5, managed as type 2 ([place])    Hypertension    Insomnia    Migraines    Reflux    Sinusitis     Past Surgical History:  Procedure Laterality Date   ABDOMINAL HYSTERECTOMY     [place]   Ovarian   OOPHORECTOMY     OVARIAN CYST SURGERY  2006   RECTOVAGINAL FISTULA CLOSURE  1980    Social History Social History   Tobacco Use   Smoking status: Former    Pack years: 0.00    Types: Cigarettes    Quit date: 05/24/2011    Years since quitting: 9.8   Smokeless tobacco: Never  Substance Use Topics   Alcohol use: No   Drug use: No    Family History Family History  Problem Relation Age of Onset   Hodgkin's lymphoma Mother    Diabetes Mother    Thyroid disease Mother    Glaucoma Mother    Stroke Brother    Heart disease Brother     Allergies  Allergen Reactions   Sulfa Antibiotics  Hives   Oxycodone-Acetaminophen Itching    percocet     REVIEW OF SYSTEMS (Negative unless checked)  Constitutional: [] Weight loss  [] Fever  [] Chills Cardiac: [] Chest pain   [] Chest pressure   [] Palpitations   [] Shortness of breath when laying flat   [] Shortness of breath with exertion. Vascular:  [] Pain in legs with walking   [x] Pain in legs at rest  [] History of DVT   [] Phlebitis   [x] Swelling in legs   [x] Varicose veins   [] Non-healing ulcers Pulmonary:   [] Uses home oxygen   [] Productive cough   [] Hemoptysis   [] Wheeze  [] COPD   [] Asthma Neurologic:  [] Dizziness   [] Seizures   [] History of stroke   [] History of TIA  [] Aphasia   [] Vissual changes   [] Weakness or numbness in arm   [] Weakness or numbness in leg Musculoskeletal:   [] Joint swelling   [x] Joint pain   [] Low back pain Hematologic:  [] Easy bruising  [] Easy bleeding   [] Hypercoagulable state   [] Anemic Gastrointestinal:  [] Diarrhea   [] Vomiting  [x] Gastroesophageal reflux/heartburn   [] Difficulty swallowing. Genitourinary:  [] Chronic kidney disease   [] Difficult urination  [] Frequent urination   [] Blood in urine Skin:  [] Rashes   [] Ulcers  Psychological:  [] History of anxiety   []  History of  major depression.  Physical Examination  There were no vitals filed for this visit. There is no height or weight on file to calculate BMI. Gen: WD/WN, NAD Head: [place]/AT, No temporalis wasting.  Ear/Nose/Throat: Hearing grossly intact, nares w/o erythema or drainage Eyes: PER, EOMI, sclera nonicteric.  Neck: Supple, no large masses.   Pulmonary:  Good air movement, no audible wheezing bilaterally, no use of accessory muscles.  Cardiac: RRR, no JVD Vascular:  scattered varicosities present bilaterally.  Mild venous stasis changes to the legs bilaterally.  3+ soft pitting edema right > left Vessel Right Left  Radial Palpable Palpable  PT Palpable Palpable  DP Palpable Palpable  Gastrointestinal: Non-distended. No guarding/no peritoneal  signs.  Musculoskeletal: M/S 5/5 throughout.  No deformity or atrophy.  Neurologic: CN 2-12 intact. Symmetrical.  Speech is fluent. Motor exam as listed above. Psychiatric: Judgment intact, Mood & affect appropriate for pt's clinical situation. Dermatologic: Mild venous rashes no ulcers noted.  No changes consistent with cellulitis. Lymph : No lichenification or skin changes of chronic lymphedema.  CBC Lab Results  Component Value Date   WBC 11.2 (H) 11/26/2020   HGB 13.0 11/26/2020   HCT 39.4 11/26/2020   MCV 89 11/26/2020   PLT 347 11/26/2020    BMET    Component Value Date/Time   NA 140 11/26/2020 1548   NA 141 07/31/2014 1024   K 4.2 11/26/2020 1548   K 3.6 07/31/2014 1024   CL 99 11/26/2020 1548   CL 105 07/31/2014 1024   CO2 22 11/26/2020 1548   CO2 26 07/31/2014 1024   GLUCOSE 108 (H) 11/26/2020 1548   GLUCOSE 99 08/09/2019 1545   GLUCOSE 108 (H) 07/31/2014 1024   BUN 9 11/26/2020 1548   BUN 9 07/31/2014 1024   CREATININE 0.84 11/26/2020 1548   CREATININE 0.73 07/31/2014 1024   CALCIUM 10.0 11/26/2020 1548   CALCIUM 8.7 07/31/2014 1024   GFRNONAA >60 08/09/2019 1545   GFRNONAA >60 07/31/2014 1024   GFRAA >60 08/09/2019 1545   GFRAA >60 07/31/2014 1024   CrCl cannot be calculated (Patient's most recent lab result is older than the maximum 21 days allowed.).  COAG No results found for: INR, PROTIME  Radiology No results found.   Assessment/Plan 1. Lymphedema Recommend:  No surgery or intervention at this point in time.    I have reviewed my previous discussion with the patient regarding swelling and why it causes symptoms.  Patient will continue wearing graduated compression stockings class 1 (20-30 mmHg) on a daily basis. The patient will  beginning wearing the stockings first thing in the morning and removing them in the evening. The patient is instructed specifically not to sleep in the stockings.    In addition, behavioral modification including  several periods of elevation of the lower extremities during the day will be continued.  This was reviewed with the patient during the initial visit.  The patient will also continue routine exercise, especially walking on a daily basis as was discussed during the initial visit.    Despite conservative treatments including graduated compression therapy class 1 and behavioral modification including exercise and elevation the patient  has not obtained adequate control of the lymphedema.  The patient still has stage 3 lymphedema and therefore, I believe that a lymph pump should be added to improve the control of the patient's lymphedema.  Additionally, a lymph pump is warranted because it will reduce the risk of cellulitis and ulceration in the future.  Patient should  follow-up in six months    2. Varicose veins of leg with edema, bilateral Although she does have reflux in her right GSV it is a segment and not the full vein.  Therefore I would like to see if maximizing her treatment of her edema will improve her symptoms before moving forward with laser ablation  3. Chronic venous insufficiency Recommend:  No surgery or intervention at this point in time.    I have reviewed my previous discussion with the patient regarding swelling and why it causes symptoms.  Patient will continue wearing graduated compression stockings class 1 (20-30 mmHg) on a daily basis. The patient will  beginning wearing the stockings first thing in the morning and removing them in the evening. The patient is instructed specifically not to sleep in the stockings.    In addition, behavioral modification including several periods of elevation of the lower extremities during the day will be continued.  This was reviewed with the patient during the initial visit.  The patient will also continue routine exercise, especially walking on a daily basis as was discussed during the initial visit.    Despite conservative treatments  including graduated compression therapy class 1 and behavioral modification including exercise and elevation the patient  has not obtained adequate control of the lymphedema.  The patient still has stage 3 lymphedema and therefore, I believe that a lymph pump should be added to improve the control of the patient's lymphedema.  Additionally, a lymph pump is warranted because it will reduce the risk of cellulitis and ulceration in the future.  Patient should follow-up in six months    4. Carotid atherosclerosis, bilateral Recommend:  Given the patient's asymptomatic subcritical stenosis no further invasive testing or surgery at this time.  Continue antiplatelet therapy as prescribed Continue management of CAD, HTN and Hyperlipidemia Healthy heart diet,  encouraged exercise at least 4 times per week Follow up in 12 months with duplex ultrasound and physical exam    5. Essential (primary) hypertension Continue antihypertensive medications as already ordered, these medications have been reviewed and there are no changes at this time.     Hortencia Pilar, MD  03/21/2021 11:20 AM

## 2021-03-22 NOTE — Progress Notes (Signed)
This patient sees you next. Results came to me

## 2021-03-28 ENCOUNTER — Other Ambulatory Visit: Payer: Self-pay

## 2021-03-28 ENCOUNTER — Other Ambulatory Visit: Payer: Self-pay | Admitting: Nurse Practitioner

## 2021-03-28 ENCOUNTER — Other Ambulatory Visit: Payer: Self-pay | Admitting: Internal Medicine

## 2021-03-28 DIAGNOSIS — E1165 Type 2 diabetes mellitus with hyperglycemia: Secondary | ICD-10-CM

## 2021-03-28 DIAGNOSIS — I10 Essential (primary) hypertension: Secondary | ICD-10-CM

## 2021-03-28 DIAGNOSIS — E1142 Type 2 diabetes mellitus with diabetic polyneuropathy: Secondary | ICD-10-CM

## 2021-03-28 DIAGNOSIS — R053 Chronic cough: Secondary | ICD-10-CM

## 2021-03-28 MED ORDER — LEVOCETIRIZINE DIHYDROCHLORIDE 5 MG PO TABS: 5.0000 mg | ORAL_TABLET | Freq: Every evening | ORAL | 0 refills | Status: DC

## 2021-03-28 MED ORDER — ROSUVASTATIN CALCIUM 5 MG PO TABS: 5.0000 mg | ORAL_TABLET | Freq: Every day | ORAL | 3 refills | Status: DC

## 2021-04-26 ENCOUNTER — Telehealth: Payer: Self-pay

## 2021-04-26 ENCOUNTER — Other Ambulatory Visit: Payer: Self-pay | Admitting: Physician Assistant

## 2021-04-26 DIAGNOSIS — F5101 Primary insomnia: Secondary | ICD-10-CM

## 2021-04-26 MED ORDER — ZOLPIDEM TARTRATE 10 MG PO TABS: 10.0000 mg | ORAL_TABLET | Freq: Every evening | ORAL | 0 refills | Status: DC | PRN

## 2021-04-26 NOTE — Telephone Encounter (Signed)
Pt.notified

## 2021-04-30 ENCOUNTER — Other Ambulatory Visit: Payer: Self-pay | Admitting: Nurse Practitioner

## 2021-04-30 DIAGNOSIS — E1165 Type 2 diabetes mellitus with hyperglycemia: Secondary | ICD-10-CM

## 2021-04-30 DIAGNOSIS — I10 Essential (primary) hypertension: Secondary | ICD-10-CM

## 2021-05-01 ENCOUNTER — Other Ambulatory Visit: Payer: Self-pay

## 2021-05-01 DIAGNOSIS — E1165 Type 2 diabetes mellitus with hyperglycemia: Secondary | ICD-10-CM

## 2021-05-01 MED ORDER — METFORMIN HCL 500 MG PO TABS: 500.0000 mg | ORAL_TABLET | Freq: Two times a day (BID) | ORAL | 3 refills | Status: DC

## 2021-05-21 ENCOUNTER — Other Ambulatory Visit: Payer: Self-pay | Admitting: Internal Medicine

## 2021-05-21 DIAGNOSIS — M25511 Pain in right shoulder: Secondary | ICD-10-CM

## 2021-05-21 DIAGNOSIS — M5000 Cervical disc disorder with myelopathy, unspecified cervical region: Secondary | ICD-10-CM

## 2021-05-21 NOTE — Telephone Encounter (Signed)
Is this pt on mobic as well, pls check with pt

## 2021-05-22 ENCOUNTER — Telehealth: Payer: Self-pay

## 2021-05-22 ENCOUNTER — Other Ambulatory Visit: Payer: Self-pay

## 2021-05-22 DIAGNOSIS — E1142 Type 2 diabetes mellitus with diabetic polyneuropathy: Secondary | ICD-10-CM

## 2021-05-22 MED ORDER — GABAPENTIN 100 MG PO CAPS: 100.0000 mg | ORAL_CAPSULE | Freq: Two times a day (BID) | ORAL | 3 refills | Status: DC

## 2021-05-22 NOTE — Telephone Encounter (Signed)
Spoke  with pt she is not taking mobic

## 2021-05-22 NOTE — Telephone Encounter (Signed)
Spoke with she is taking ibuprofen not meloxicam and also pt said she tested positive for covid last Sunday make her virtual appt for tomorrow  and also advised if she sob or symptoms gets worse she can go to ED otherwise w will see her tomorrow and also advised drink plenty of water and rest

## 2021-05-23 ENCOUNTER — Encounter: Payer: Self-pay | Admitting: Physician Assistant

## 2021-05-23 ENCOUNTER — Telehealth: Payer: No Typology Code available for payment source | Admitting: Internal Medicine

## 2021-05-23 ENCOUNTER — Other Ambulatory Visit: Payer: Self-pay

## 2021-05-23 VITALS — BP 161/87 | Temp 98.4°F | Ht 68.5 in | Wt 225.0 lb

## 2021-05-23 DIAGNOSIS — I10 Essential (primary) hypertension: Secondary | ICD-10-CM

## 2021-05-23 DIAGNOSIS — F411 Generalized anxiety disorder: Secondary | ICD-10-CM | POA: Diagnosis not present

## 2021-05-23 DIAGNOSIS — J208 Acute bronchitis due to other specified organisms: Secondary | ICD-10-CM

## 2021-05-23 DIAGNOSIS — E1165 Type 2 diabetes mellitus with hyperglycemia: Secondary | ICD-10-CM

## 2021-05-23 DIAGNOSIS — U071 COVID-19: Secondary | ICD-10-CM

## 2021-05-23 DIAGNOSIS — N3281 Overactive bladder: Secondary | ICD-10-CM

## 2021-05-23 DIAGNOSIS — F5101 Primary insomnia: Secondary | ICD-10-CM

## 2021-05-23 MED ORDER — ZOLPIDEM TARTRATE 10 MG PO TABS: 10.0000 mg | ORAL_TABLET | Freq: Every evening | ORAL | 0 refills | Status: DC | PRN

## 2021-05-23 MED ORDER — NIRMATRELVIR/RITONAVIR (PAXLOVID)TABLET: 3.0000 | ORAL_TABLET | Freq: Two times a day (BID) | ORAL | 0 refills | Status: AC

## 2021-05-23 MED ORDER — OXYBUTYNIN CHLORIDE ER 10 MG PO TB24: 10.0000 mg | ORAL_TABLET | Freq: Every day | ORAL | 1 refills | Status: DC

## 2021-05-23 MED ORDER — ALPRAZOLAM 0.5 MG PO TABS: ORAL_TABLET | ORAL | 0 refills | Status: DC

## 2021-05-23 NOTE — Progress Notes (Signed)
[facility] [place], [place] [address]  Internal MEDICINE  Telephone Visit  Patient Name: Valerie Ramos  [id number]  [id number]  Date of Service: 05/23/2021  I connected with the patient at 1200 by telephone and verified the patients identity using two identifiers.   I discussed the limitations, risks, security and privacy concerns of performing an evaluation and management service by telephone and the availability of in person appointments. I also discussed with the patient that there may be a patient responsible charge related to the service.  The patient expressed understanding and agrees to proceed.    Chief Complaint  Patient presents with   Telephone Assessment    Covid positive on 05/19/21   Telephone Screen    [id number]   Sinusitis   Ear Pain    Left    Headache   Cough   Fatigue    HPI  Pt is connected for COVID viral infection All symptoms are noted. Needs refills  PDMP is reviewed for ORS    Current Medication: Outpatient Encounter Medications as of 05/23/2021  Medication Sig   acetaminophen (TYLENOL) 325 MG tablet Take 2 tablets (650 mg total) by mouth every 6 (six) hours as needed for mild pain (or Fever >/= 101).   atenolol (TENORMIN) 25 MG tablet Take 1/2 tablet po every evening   cyclobenzaprine (FLEXERIL) 5 MG tablet Take 5 mg by mouth at bedtime as needed.   DULoxetine (CYMBALTA) 30 MG capsule Take 30 mg by mouth daily.   estradiol (ESTRACE) 0.5 MG tablet Take 1 tablet (0.5 mg total) by mouth daily.   fluticasone (FLONASE) 50 MCG/ACT nasal spray SPRAY 2 SPRAYS INTO EACH NOSTRIL EVERY DAY   gabapentin (NEURONTIN) 100 MG capsule Take 1 capsule (100 mg total) by mouth 2 (two) times daily.   glucose blood (ONE TOUCH ULTRA TEST) test strip FOR ONCE DAILY TESTING DX. E11.65   hydrochlorothiazide (HYDRODIURIL) 25 MG tablet TAKE 1 TABLET(S) BY MOUTH DAILY FOR BLOOD PRESSURE/EDEMA   ibuprofen (ADVIL) 600 MG tablet TAKE 1 TABLET BY MOUTH  EVERY 8 HOURS AS NEEDED (DO NOT TAKE THIS PRODUCT ON A REGULAR BASIS)   levocetirizine (XYZAL) 5 MG tablet Take 1 tablet (5 mg total) by mouth every evening.   losartan (COZAAR) 25 MG tablet Take 2 tablets (50 mg total) by mouth daily.   metFORMIN (GLUCOPHAGE) 500 MG tablet Take 1 tablet (500 mg total) by mouth 2 (two) times daily with a meal.   metoCLOPramide (REGLAN) 10 MG tablet TAKE 1 TABLET THREE TIMES A DAY AS NEEDED FOR REFLUX   nirmatrelvir/ritonavir EUA (PAXLOVID) 20 x 150 MG & 10 x '100MG'$  TABS Take 3 tablets by mouth 2 (two) times daily for 5 days. Of both medicine ( total 3 tabs ) at one time 2 x day   OneTouch Delica Lancets 99991111 MISC 1 each by Does not apply route daily. Use as directed once daily diag e11.65   pantoprazole (PROTONIX) 40 MG tablet Take 1 tablet (40 mg total) by mouth daily.   rosuvastatin (CRESTOR) 5 MG tablet Take 1 tablet (5 mg total) by mouth daily.   tiZANidine (ZANAFLEX) 2 MG tablet Take 1 tablet (2 mg total) by mouth 2 (two) times daily as needed for muscle spasms.   [DISCONTINUED] ALPRAZolam (XANAX) 0.5 MG tablet TAKE 1 TABLET BY MOUTH TWICE A DAY AS NEEDED FOR ANXIETY   [DISCONTINUED] amoxicillin-clavulanate (AUGMENTIN) 875-125 MG tablet Take 1 tablet by mouth 2 (two) times daily.   [  DISCONTINUED] oxybutynin (DITROPAN-XL) 10 MG 24 hr tablet Take 1 tablet (10 mg total) by mouth at bedtime.   [DISCONTINUED] zolpidem (AMBIEN) 10 MG tablet Take 1 tablet (10 mg total) by mouth at bedtime as needed for sleep.   ALPRAZolam (XANAX) 0.5 MG tablet Take one tab po bid   famotidine (PEPCID) 20 MG tablet Take 1 tablet (20 mg total) by mouth 2 (two) times daily.   oxybutynin (DITROPAN-XL) 10 MG 24 hr tablet Take 1 tablet (10 mg total) by mouth at bedtime.   zolpidem (AMBIEN) 10 MG tablet Take 1 tablet (10 mg total) by mouth at bedtime as needed for sleep.   No facility-administered encounter medications on file as of 05/23/2021.    Surgical History: Past Surgical History:   Procedure Laterality Date   ABDOMINAL HYSTERECTOMY     CESAREAN SECTION  1989   CYSTECTOMY  2000   Ovarian   OOPHORECTOMY     OVARIAN CYST SURGERY  2006   RECTOVAGINAL FISTULA CLOSURE  1980    Medical History: Past Medical History:  Diagnosis Date   Anxiety    Arthritis    Depression    Diabetes 1.5, managed as type 2 ([place])    Hypertension    Insomnia    Migraines    Reflux    Sinusitis     Family History: Family History  Problem Relation Age of Onset   Hodgkin's lymphoma Mother    Diabetes Mother    Thyroid disease Mother    Glaucoma Mother    Stroke Brother    Heart disease Brother     Social History   Socioeconomic History   Marital status: Married    Spouse name: Not on file   Number of children: Not on file   Years of education: Not on file   Highest education level: Not on file  Occupational History   Not on file  Tobacco Use   Smoking status: Former    Types: Cigarettes    Quit date: 05/24/2011    Years since quitting: 10.0   Smokeless tobacco: Never  Substance and Sexual Activity   Alcohol use: No   Drug use: No   Sexual activity: Not on file  Other Topics Concern   Not on file  Social History Narrative   Not on file   Social Determinants of Health   Financial Resource Strain: Not on file  Food Insecurity: Not on file  Transportation Needs: Not on file  Physical Activity: Not on file  Stress: Not on file  Social Connections: Not on file  Intimate Partner Violence: Not on file      Review of Systems  Constitutional:  Negative for fatigue and fever.  HENT:  Positive for congestion. Negative for mouth sores and postnasal drip.   Respiratory:  Negative for cough.   Cardiovascular:  Negative for chest pain.  Genitourinary:  Negative for flank pain.  Neurological:  Positive for headaches.  Psychiatric/Behavioral: Negative.     Vital Signs: BP (!) 161/87   Temp 98.4 F (36.9 C)   Ht 5' 8.5" (1.74 m)   Wt 225 lb (102.1 kg)   BMI  33.71 kg/m    Observation/Objective: Elevated bp at home. Not sleeping well NAD     Assessment/Plan: 1. Acute bronchitis due to COVID-19 virus Pt meets criterial for antiviral therapy  - nirmatrelvir/ritonavir EUA (PAXLOVID) 20 x 150 MG & 10 x '100MG'$  TABS; Take 3 tablets by mouth 2 (two) times daily for 5 days.  Of both medicine ( total 3 tabs ) at one time 2 x day  Dispense: 30 tablet; Refill: 0  2. Generalized anxiety disorder Refilled xanax for GAD, Will need to work on tapering  - ALPRAZolam (XANAX) 0.5 MG tablet; Take one tab po bid  Dispense: 60 tablet; Refill: 0  3. Primary insomnia Look into other causes of insomnia  - zolpidem (AMBIEN) 10 MG tablet; Take 1 tablet (10 mg total) by mouth at bedtime as needed for sleep.  Dispense: 30 tablet; Refill: 0  4. Overactive bladder - oxybutynin (DITROPAN-XL) 10 MG 24 hr tablet; Take 1 tablet (10 mg total) by mouth at bedtime.  Dispense: 90 tablet; Refill: 1  5. Elevated blood pressure reading with diagnosis of hypertension Pt is on 3 antihypertensives, low dose, will modify therapy on next visit    General Counseling: Sandy Salaam understanding of the findings of today's phone visit and agrees with plan of treatment. I have discussed any further diagnostic evaluation that may be needed or ordered today. We also reviewed her medications today. she has been encouraged to call the office with any questions or concerns that should arise related to todays visit.  ORS (140)   No orders of the defined types were placed in this encounter.   Meds ordered this encounter  Medications   ALPRAZolam (XANAX) 0.5 MG tablet    Sig: Take one tab po bid    Dispense:  60 tablet    Refill:  0    Not to exceed 5 additional fills before 08/20/2021 DX Code Needed  .   zolpidem (AMBIEN) 10 MG tablet    Sig: Take 1 tablet (10 mg total) by mouth at bedtime as needed for sleep.    Dispense:  30 tablet    Refill:  0   oxybutynin (DITROPAN-XL) 10 MG  24 hr tablet    Sig: Take 1 tablet (10 mg total) by mouth at bedtime.    Dispense:  90 tablet    Refill:  1   nirmatrelvir/ritonavir EUA (PAXLOVID) 20 x 150 MG & 10 x '100MG'$  TABS    Sig: Take 3 tablets by mouth 2 (two) times daily for 5 days. Of both medicine ( total 3 tabs ) at one time 2 x day    Dispense:  30 tablet    Refill:  0    Time spent:25 Minutes    Dr Lavera Guise Internal medicine

## 2021-05-24 ENCOUNTER — Ambulatory Visit: Payer: No Typology Code available for payment source | Admitting: Physician Assistant

## 2021-05-27 ENCOUNTER — Ambulatory Visit: Payer: No Typology Code available for payment source | Admitting: Internal Medicine

## 2021-06-18 ENCOUNTER — Encounter: Payer: No Typology Code available for payment source | Admitting: Internal Medicine

## 2021-06-19 ENCOUNTER — Other Ambulatory Visit: Payer: Self-pay | Admitting: Nurse Practitioner

## 2021-06-25 ENCOUNTER — Other Ambulatory Visit: Payer: Self-pay

## 2021-06-25 ENCOUNTER — Other Ambulatory Visit: Payer: Self-pay | Admitting: Nurse Practitioner

## 2021-06-25 DIAGNOSIS — F411 Generalized anxiety disorder: Secondary | ICD-10-CM

## 2021-06-25 DIAGNOSIS — F5101 Primary insomnia: Secondary | ICD-10-CM

## 2021-06-25 MED ORDER — ALPRAZOLAM 0.5 MG PO TABS: ORAL_TABLET | ORAL | 0 refills | Status: DC

## 2021-06-25 MED ORDER — ZOLPIDEM TARTRATE 10 MG PO TABS: 10.0000 mg | ORAL_TABLET | Freq: Every evening | ORAL | 0 refills | Status: DC | PRN

## 2021-06-25 NOTE — Telephone Encounter (Signed)
Called in as per alyssa for xanax and zolpidem for 30 days due to we have reschedule her appt

## 2021-07-09 ENCOUNTER — Telehealth: Payer: Self-pay

## 2021-07-09 ENCOUNTER — Encounter: Payer: Self-pay | Admitting: Internal Medicine

## 2021-07-09 ENCOUNTER — Other Ambulatory Visit: Payer: Self-pay

## 2021-07-09 ENCOUNTER — Ambulatory Visit (INDEPENDENT_AMBULATORY_CARE_PROVIDER_SITE_OTHER): Payer: No Typology Code available for payment source | Admitting: Internal Medicine

## 2021-07-09 VITALS — BP 132/80 | HR 79 | Temp 97.9°F | Resp 16 | Ht 68.5 in | Wt 240.8 lb

## 2021-07-09 DIAGNOSIS — F411 Generalized anxiety disorder: Secondary | ICD-10-CM

## 2021-07-09 DIAGNOSIS — E1165 Type 2 diabetes mellitus with hyperglycemia: Secondary | ICD-10-CM | POA: Diagnosis not present

## 2021-07-09 DIAGNOSIS — I10 Essential (primary) hypertension: Secondary | ICD-10-CM

## 2021-07-09 DIAGNOSIS — E1142 Type 2 diabetes mellitus with diabetic polyneuropathy: Secondary | ICD-10-CM

## 2021-07-09 DIAGNOSIS — Z9189 Other specified personal risk factors, not elsewhere classified: Secondary | ICD-10-CM

## 2021-07-09 DIAGNOSIS — R1310 Dysphagia, unspecified: Secondary | ICD-10-CM

## 2021-07-09 DIAGNOSIS — F5101 Primary insomnia: Secondary | ICD-10-CM

## 2021-07-09 DIAGNOSIS — R300 Dysuria: Secondary | ICD-10-CM

## 2021-07-09 DIAGNOSIS — Z6836 Body mass index (BMI) 36.0-36.9, adult: Secondary | ICD-10-CM

## 2021-07-09 DIAGNOSIS — K219 Gastro-esophageal reflux disease without esophagitis: Secondary | ICD-10-CM

## 2021-07-09 DIAGNOSIS — Z0001 Encounter for general adult medical examination with abnormal findings: Secondary | ICD-10-CM | POA: Diagnosis not present

## 2021-07-09 LAB — POCT GLYCOSYLATED HEMOGLOBIN (HGB A1C): Hemoglobin A1C: 7.4 % — AB (ref 4.0–5.6)

## 2021-07-09 MED ORDER — ATENOLOL 25 MG PO TABS: ORAL_TABLET | ORAL | 3 refills | Status: DC

## 2021-07-09 MED ORDER — ONETOUCH DELICA LANCETS 30G MISC: 1.0000 | Freq: Every day | 3 refills | Status: DC

## 2021-07-09 MED ORDER — ESCITALOPRAM OXALATE 10 MG PO TABS: ORAL_TABLET | ORAL | 2 refills | Status: DC

## 2021-07-09 MED ORDER — GABAPENTIN 100 MG PO CAPS: ORAL_CAPSULE | ORAL | 3 refills | Status: DC

## 2021-07-09 MED ORDER — LOSARTAN POTASSIUM 100 MG PO TABS: 100.0000 mg | ORAL_TABLET | Freq: Every day | ORAL | 1 refills | Status: DC

## 2021-07-09 MED ORDER — GLUCOSE BLOOD VI STRP: ORAL_STRIP | 3 refills | Status: DC

## 2021-07-09 NOTE — Progress Notes (Signed)
[facility] [place], [place] [address]  Internal MEDICINE  Office Visit Note  Patient Name: Valerie Ramos  [id number]  [id number]  Date of Service: 07/10/2021  Chief Complaint  Patient presents with   Annual Exam   Anxiety   Insomnia   Medication Refill   Diabetes    Gave jardiance 10 mg samples given today     HPI Pt is here for routine health maintenance examination -Even though patient is here for annual wellness visit however patient has multiple complaints, she had covid in August, vaccinated x 3. Nasty cold in chest and head, it took a while for her to recover from it -Her main concern today is swallowing issue, she is not having problem with food, no choking. Thinks its a feeling, GERD has been diagnosed in the past.  Patient has been on metoclopramide high-dose she is unaware of the side effects of having Tardive dyskinesia  -She suffers from great deal of anxiety as her son has MS and has been has been diagnosed with parkinsonism disease patient is taking Xanax 0.5 mg twice a day, has not tried any maintenance medication like Lexapro or Zoloft in the past. -She also has been on Ambien 10 mg nightly, home sleep sleep study test was done which was negative OSA however severe hypoxia was noted, patient is not on any oxygen at the time. -Diabetes is not well controlled she is only on metformin -Blood pressure is also not controlled patient is taking triple therapy -Maintained on HRT due to hot flashes, feels reluctant to tapering down -Patient is also on multiple other medications for chronic back pain neuropathy including Flexeril , Zanaflex Current Medication: Outpatient Encounter Medications as of 07/09/2021  Medication Sig   acetaminophen (TYLENOL) 325 MG tablet Take 2 tablets (650 mg total) by mouth every 6 (six) hours as needed for mild pain (or Fever >/= 101).   ALPRAZolam (XANAX) 0.5 MG tablet Take one tab po bid   [START ON 07/24/2021]  ALPRAZolam (XANAX) 0.5 MG tablet TAKE HALF TO ONE TAB PO BID PRN FOR ANXIETY ATTACKS  ( DO NOT TAKE THIS MEDICINE EVERY DAY DUE TO DEPENDENCE)   cyclobenzaprine (FLEXERIL) 5 MG tablet Take 5 mg by mouth at bedtime as needed.   empagliflozin (JARDIANCE) 25 MG TABS tablet Take 1 tablet (25 mg total) by mouth daily before breakfast.   escitalopram (LEXAPRO) 10 MG tablet Take one tab po for generalized anxiety disorder   estradiol (ESTRACE) 0.5 MG tablet Take 1 tablet (0.5 mg total) by mouth daily.   fluticasone (FLONASE) 50 MCG/ACT nasal spray SPRAY 2 SPRAYS INTO EACH NOSTRIL EVERY DAY   gabapentin (NEURONTIN) 100 MG capsule One tab po qam and 1-3 tabs at night   levocetirizine (XYZAL) 5 MG tablet Take 1 tablet (5 mg total) by mouth every evening.   losartan (COZAAR) 100 MG tablet Take 1 tablet (100 mg total) by mouth daily.   oxybutynin (DITROPAN-XL) 10 MG 24 hr tablet Take 1 tablet (10 mg total) by mouth at bedtime.   pantoprazole (PROTONIX) 40 MG tablet Take 1 tablet (40 mg total) by mouth daily.   PNEUMOCOCCAL 20-VAL CONJ VACC IM Inject into the muscle.   rosuvastatin (CRESTOR) 5 MG tablet Take 1 tablet (5 mg total) by mouth daily.   tiZANidine (ZANAFLEX) 2 MG tablet Take 1 tablet (2 mg total) by mouth 2 (two) times daily as needed for muscle spasms.   zolpidem (AMBIEN) 10 MG tablet Take 1  tablet (10 mg total) by mouth at bedtime as needed for sleep.   zolpidem (AMBIEN) 10 MG tablet TAKE ONE TAB PO QHS FOR INSOMNIA   [DISCONTINUED] atenolol (TENORMIN) 25 MG tablet Take 1/2 tablet po every evening   [DISCONTINUED] DULoxetine (CYMBALTA) 30 MG capsule Take 30 mg by mouth daily.   [DISCONTINUED] gabapentin (NEURONTIN) 100 MG capsule Take 1 capsule (100 mg total) by mouth 2 (two) times daily.   [DISCONTINUED] glucose blood (ONE TOUCH ULTRA TEST) test strip FOR ONCE DAILY TESTING DX. E11.65   [DISCONTINUED] hydrochlorothiazide (HYDRODIURIL) 25 MG tablet TAKE 1 TABLET(S) BY MOUTH DAILY FOR BLOOD  PRESSURE/EDEMA   [DISCONTINUED] ibuprofen (ADVIL) 600 MG tablet TAKE 1 TABLET BY MOUTH EVERY 8 HOURS AS NEEDED (DO NOT TAKE THIS PRODUCT ON A REGULAR BASIS)   [DISCONTINUED] losartan (COZAAR) 25 MG tablet Take 2 tablets (50 mg total) by mouth daily.   [DISCONTINUED] metFORMIN (GLUCOPHAGE) 500 MG tablet Take 1 tablet (500 mg total) by mouth 2 (two) times daily with a meal.   [DISCONTINUED] metoCLOPramide (REGLAN) 10 MG tablet TAKE 1 TABLET THREE TIMES A DAY AS NEEDED FOR REFLUX   [DISCONTINUED] OneTouch Delica Lancets 12X MISC 1 each by Does not apply route daily. Use as directed once daily diag e11.65   atenolol (TENORMIN) 25 MG tablet Take one tab po qhs for bp   [DISCONTINUED] famotidine (PEPCID) 20 MG tablet Take 1 tablet (20 mg total) by mouth 2 (two) times daily.   No facility-administered encounter medications on file as of 07/09/2021.    Surgical History: Past Surgical History:  Procedure Laterality Date   ABDOMINAL HYSTERECTOMY     CESAREAN SECTION  1989   CYSTECTOMY  2000   Ovarian   OOPHORECTOMY     OVARIAN CYST SURGERY  2006   RECTOVAGINAL FISTULA CLOSURE  1980    Medical History: Past Medical History:  Diagnosis Date   Anxiety    Arthritis    Depression    Diabetes 1.5, managed as type 2 ([place])    Hypertension    Insomnia    Migraines    Reflux    Sinusitis     Family History: Family History  Problem Relation Age of Onset   Hodgkin's lymphoma Mother    Diabetes Mother    Thyroid disease Mother    Glaucoma Mother    Stroke Brother    Heart disease Brother     Social History: Social History   Socioeconomic History   Marital status: Married    Spouse name: Not on file   Number of children: Not on file   Years of education: Not on file   Highest education level: Not on file  Occupational History   Not on file  Tobacco Use   Smoking status: Former    Types: Cigarettes    Quit date: 05/24/2011    Years since quitting: 10.1   Smokeless tobacco:  Never  Substance and Sexual Activity   Alcohol use: No   Drug use: No   Sexual activity: Not on file  Other Topics Concern   Not on file  Social History Narrative   Not on file   Social Determinants of Health   Financial Resource Strain: Not on file  Food Insecurity: Not on file  Transportation Needs: Not on file  Physical Activity: Not on file  Stress: Not on file  Social Connections: Not on file      Review of Systems  Constitutional:  Negative for chills, fatigue and  unexpected weight change.  HENT:  Negative for congestion, postnasal drip, rhinorrhea, sneezing and sore throat.   Eyes:  Negative for redness.  Respiratory:  Negative for cough, chest tightness and shortness of breath.   Cardiovascular:  Negative for chest pain and palpitations.  Gastrointestinal:  Negative for abdominal pain, constipation, diarrhea, nausea and vomiting.  Genitourinary:  Negative for dysuria and frequency.  Musculoskeletal:  Negative for arthralgias, back pain, joint swelling and neck pain.  Skin:  Negative for rash.  Neurological: Negative.  Negative for tremors and numbness.  Hematological:  Negative for adenopathy. Does not bruise/bleed easily.  Psychiatric/Behavioral:  Positive for dysphoric mood. Negative for behavioral problems (Depression), sleep disturbance and suicidal ideas. The patient is not nervous/anxious.     Vital Signs: BP 132/80   Pulse 79   Temp 97.9 F (36.6 C)   Resp 16   Ht 5' 8.5" (1.74 m)   Wt 240 lb 12.8 oz (109.2 kg)   SpO2 98%   BMI 36.08 kg/m    Physical Exam Constitutional:      General: She is not in acute distress.    Appearance: Normal appearance. She is well-developed. She is not diaphoretic.  HENT:     Head: Normocephalic and atraumatic.     Right Ear: External ear normal.     Left Ear: External ear normal.     Nose: Nose normal.     Mouth/Throat:     Mouth: Mucous membranes are moist.     Pharynx: No oropharyngeal exudate or posterior  oropharyngeal erythema.  Eyes:     General: No scleral icterus.       Right eye: No discharge.        Left eye: No discharge.     Extraocular Movements: Extraocular movements intact.     Conjunctiva/sclera: Conjunctivae normal.     Pupils: Pupils are equal, round, and reactive to light.  Neck:     Thyroid: No thyromegaly.     Vascular: No JVD.     Trachea: No tracheal deviation.  Cardiovascular:     Rate and Rhythm: Normal rate and regular rhythm.     Pulses: Normal pulses.     Heart sounds: Normal heart sounds. No murmur heard.   No friction rub. No gallop.  Pulmonary:     Effort: Pulmonary effort is normal. No respiratory distress.     Breath sounds: Normal breath sounds. No stridor. No wheezing or rales.  Chest:     Chest wall: No tenderness.  Breasts:    Right: Normal.     Left: Normal.  Abdominal:     General: Bowel sounds are normal. There is no distension.     Palpations: Abdomen is soft. There is no mass.     Tenderness: There is no abdominal tenderness. There is no guarding or rebound.  Musculoskeletal:        General: No tenderness or deformity. Normal range of motion.     Cervical back: Normal range of motion and neck supple.  Lymphadenopathy:     Cervical: No cervical adenopathy.  Skin:    General: Skin is warm and dry.     Coloration: Skin is not pale.     Findings: No erythema or rash.  Neurological:     General: No focal deficit present.     Mental Status: She is alert.     Cranial Nerves: No cranial nerve deficit.     Motor: No abnormal muscle tone.     Coordination: Coordination  normal.     Deep Tendon Reflexes: Reflexes are normal and symmetric.  Psychiatric:        Mood and Affect: Mood normal.        Behavior: Behavior normal.        Thought Content: Thought content normal.        Judgment: Judgment normal.     Assessment/Plan: 1. Encounter for general adult medical examination with abnormal findings Age-appropriate health maintenance updated  and ordered  2. Type 2 diabetes mellitus with hyperglycemia, without long-term current use of insulin (HCC) Worsening hemoglobin A1c will DC metformin, start Jardiance 10 mg once a day, can increase to 25 mg once a day on next visit, due to history of peripheral neuropathy gabapentin prescription is changed as prescribed today - POCT glycosylated hemoglobin (Hb A1C) - gabapentin (NEURONTIN) 100 MG capsule; One tab po qam and 1-3 tabs at night  Dispense: 90 capsule; Refill: 3  3. Essential (primary) hypertension Patient is on 3 different medications all low-dose.  We will stop her hydrochlorothiazide, increase her losartan 200 mg once a day decrease her atenolol to 25 mg once a day - losartan (COZAAR) 100 MG tablet; Take 1 tablet (100 mg total) by mouth daily.  Dispense: 90 tablet; Refill: 1 - atenolol (TENORMIN) 25 MG tablet; Take one tab po qhs for bp  Dispense: 90 tablet; Refill: 3  4. Dysphagia, unspecified type Unclear if this is psychological, questionable globus hystericus does have history of GERD we will order upper GI with barium swallow for - DG UGI W DOUBLE CM (HD BA); Future  5. Primary insomnia Patient is to continue Ambien for now however she will need overnight oximetry on next visit she might also need a in-lab sleep study  6. Class 2 severe obesity due to excess calories with serious comorbidity and body mass index (BMI) of 36.0 to 36.9 in adult [facility]) Patient is concerned about her weight gain, we will work with her to optimize her diabetic treatment to help her lose weight.  She understands this is a process  7. GERD without esophagitis Patient continues to have GERD symptoms she is on Protonix we will continue this for now may need to add H2 blocker however patient is on Reglan 10 mg 3 times daily which is high-dose patient is at risk of development of tar dive dyskinesia.  We will discontinue the medication for now  8. GAD (generalized anxiety disorder) Patient presents  with symptoms of anxiety she is not taking any medications we will start Lexapro - escitalopram (LEXAPRO) 10 MG tablet; Take one tab po for generalized anxiety disorder  Dispense: 30 tablet; Refill: 2  9. At risk for polypharmacy Patient is at risk for polypharmacy, she is on multiple medications which can be modified and can be taper down, however she feels reluctant. -I have discussed with her risk and complication of long-term HRT and has instructed her to taper down  -I have also stopped her hydrochlorothiazide.. -I have stopped the metoclopramide -Instructed her to taper down her alprazolam since I have started her on Lexapro  10. Dysuria - UA/M w/rflx Culture, Routine - Microscopic Examination   General Counseling: layal javid understanding of the findings of todays visit and agrees with plan of treatment. I have discussed any further diagnostic evaluation that may be needed or ordered today. We also reviewed her medications today. she has been encouraged to call the office with any questions or concerns that should arise related to todays visit.  Today's visit is very complex, high level of critical thinking is involved in decision making  Counseling:  [place] Controlled Substance Database was reviewed by me.  Orders Placed This Encounter  Procedures   Microscopic Examination   DG UGI W DOUBLE CM (HD BA)   UA/M w/rflx Culture, Routine   POCT glycosylated hemoglobin (Hb A1C)    Meds ordered this encounter  Medications   losartan (COZAAR) 100 MG tablet    Sig: Take 1 tablet (100 mg total) by mouth daily.    Dispense:  90 tablet    Refill:  1   gabapentin (NEURONTIN) 100 MG capsule    Sig: One tab po qam and 1-3 tabs at night    Dispense:  90 capsule    Refill:  3   atenolol (TENORMIN) 25 MG tablet    Sig: Take one tab po qhs for bp    Dispense:  90 tablet    Refill:  3   escitalopram (LEXAPRO) 10 MG tablet    Sig: Take one tab po for generalized anxiety disorder     Dispense:  30 tablet    Refill:  2   zolpidem (AMBIEN) 10 MG tablet    Sig: TAKE ONE TAB PO QHS FOR INSOMNIA    Dispense:  30 tablet    Refill:  1   ALPRAZolam (XANAX) 0.5 MG tablet    Sig: TAKE HALF TO ONE TAB PO BID PRN FOR ANXIETY ATTACKS  ( DO NOT TAKE THIS MEDICINE EVERY DAY DUE TO DEPENDENCE)    Dispense:  45 tablet    Refill:  0   empagliflozin (JARDIANCE) 25 MG TABS tablet    Sig: Take 1 tablet (25 mg total) by mouth daily before breakfast.    Dispense:  90 tablet    Refill:  1    Total time spent 60 Minutes  Time spent includes review of chart, medications, test results, and follow up plan with the patient.     Lavera Guise, MD  Internal Medicine

## 2021-07-09 NOTE — Telephone Encounter (Signed)
UGI scheduled @ [facility] on 07/16/21 @ 9:00. Notified patient. Instructed her nothing to eat or drink after midnight-[name]

## 2021-07-10 LAB — UA/M W/RFLX CULTURE, ROUTINE
Bilirubin, UA: NEGATIVE
Glucose, UA: NEGATIVE
Ketones, UA: NEGATIVE
Leukocytes,UA: NEGATIVE
Nitrite, UA: NEGATIVE
Protein,UA: NEGATIVE
RBC, UA: NEGATIVE
Specific Gravity, UA: 1.011 (ref 1.005–1.030)
Urobilinogen, Ur: 0.2 mg/dL (ref 0.2–1.0)
pH, UA: 5.5 (ref 5.0–7.5)

## 2021-07-10 LAB — MICROSCOPIC EXAMINATION
Bacteria, UA: NONE SEEN
Casts: NONE SEEN /lpf
RBC, Urine: NONE SEEN /hpf (ref 0–2)
WBC, UA: NONE SEEN /hpf (ref 0–5)

## 2021-07-10 MED ORDER — ZOLPIDEM TARTRATE 10 MG PO TABS: ORAL_TABLET | ORAL | 1 refills | Status: DC

## 2021-07-10 MED ORDER — EMPAGLIFLOZIN 25 MG PO TABS: 25.0000 mg | ORAL_TABLET | Freq: Every day | ORAL | 1 refills | Status: DC

## 2021-07-10 MED ORDER — ALPRAZOLAM 0.5 MG PO TABS: ORAL_TABLET | ORAL | 0 refills | Status: DC

## 2021-07-16 ENCOUNTER — Ambulatory Visit
Admission: RE | Admit: 2021-07-16 | Discharge: 2021-07-16 | Disposition: A | Discharge status: Home / Self Care | Payer: No Typology Code available for payment source | Source: Ambulatory Visit | Attending: Internal Medicine | Admitting: Internal Medicine

## 2021-07-16 DIAGNOSIS — R1310 Dysphagia, unspecified: Secondary | ICD-10-CM | POA: Diagnosis not present

## 2021-07-23 ENCOUNTER — Other Ambulatory Visit: Payer: Self-pay

## 2021-07-23 DIAGNOSIS — N3281 Overactive bladder: Secondary | ICD-10-CM

## 2021-07-23 MED ORDER — OXYBUTYNIN CHLORIDE ER 10 MG PO TB24
10.0000 mg | ORAL_TABLET | Freq: Every day | ORAL | 1 refills | Status: DC
Start: 2021-07-23 — End: 2021-12-27

## 2021-07-29 ENCOUNTER — Other Ambulatory Visit: Payer: Self-pay

## 2021-07-29 ENCOUNTER — Ambulatory Visit: Payer: No Typology Code available for payment source | Admitting: Nurse Practitioner

## 2021-07-29 ENCOUNTER — Encounter: Payer: Self-pay | Admitting: Nurse Practitioner

## 2021-07-29 VITALS — BP 140/63 | HR 65 | Temp 98.2°F | Resp 16 | Ht 68.5 in | Wt 237.8 lb

## 2021-07-29 DIAGNOSIS — I10 Essential (primary) hypertension: Secondary | ICD-10-CM | POA: Diagnosis not present

## 2021-07-29 DIAGNOSIS — E1165 Type 2 diabetes mellitus with hyperglycemia: Secondary | ICD-10-CM

## 2021-07-29 DIAGNOSIS — F411 Generalized anxiety disorder: Secondary | ICD-10-CM | POA: Diagnosis not present

## 2021-07-29 DIAGNOSIS — K219 Gastro-esophageal reflux disease without esophagitis: Secondary | ICD-10-CM

## 2021-07-29 MED ORDER — GLIMEPIRIDE 1 MG PO TABS: 1.0000 mg | ORAL_TABLET | Freq: Every day | ORAL | 2 refills | Status: DC

## 2021-07-29 MED ORDER — HYDROCHLOROTHIAZIDE 25 MG PO TABS: 25.0000 mg | ORAL_TABLET | Freq: Every day | ORAL | 3 refills | Status: DC

## 2021-07-29 MED ORDER — METOCLOPRAMIDE HCL 10 MG PO TABS: 10.0000 mg | ORAL_TABLET | Freq: Three times a day (TID) | ORAL | 2 refills | Status: DC

## 2021-07-29 NOTE — Progress Notes (Signed)
[facility] [place], [place] [address]  Internal MEDICINE  Office Visit Note  Patient Name: Valerie Ramos  [id number]  [id number]  Date of Service: 07/29/2021  Chief Complaint  Patient presents with   Follow-up    Discuss meds   Anxiety   Depression   Hypertension   Diabetes    HPI Keirra presents for a follow up visit for medication review. She has hypertension and diabetes. At her previous office visit on 07/09/21, her A1C was elevated further at 7.4 from 7.2. she was started on jardiance 10 mg daily. She stopped taking the jardiance because it caused a yeast infection. She has been limiting starchy foods but admits she has been eating minimuffins and rice krispie  Maecy has been experiencing increased anxiety and was started on lexapro at her previous office visit as well.  For her blood pressure, her hydrochlorothiazide was discontinued at her previous visit. Losartan was increased to 100 mg daily.  Mammogram was done Upper GI imaging showed a small hiatal hernia and mild GERD. Off and on trouble swallowing, burping causing some problems with vomiting.      Current Medication: Outpatient Encounter Medications as of 07/29/2021  Medication Sig   acetaminophen (TYLENOL) 325 MG tablet Take 2 tablets (650 mg total) by mouth every 6 (six) hours as needed for mild pain (or Fever >/= 101).   ALPRAZolam (XANAX) 0.5 MG tablet Take one tab po bid   ALPRAZolam (XANAX) 0.5 MG tablet TAKE HALF TO ONE TAB PO BID PRN FOR ANXIETY ATTACKS  ( DO NOT TAKE THIS MEDICINE EVERY DAY DUE TO DEPENDENCE)   atenolol (TENORMIN) 25 MG tablet Take one tab po qhs for bp   cyclobenzaprine (FLEXERIL) 5 MG tablet Take 5 mg by mouth at bedtime as needed.   estradiol (ESTRACE) 0.5 MG tablet Take 1 tablet (0.5 mg total) by mouth daily.   fluticasone (FLONASE) 50 MCG/ACT nasal spray SPRAY 2 SPRAYS INTO EACH NOSTRIL EVERY DAY   gabapentin (NEURONTIN) 100 MG capsule One tab po qam and 1-3  tabs at night   glimepiride (AMARYL) 1 MG tablet Take 1 tablet (1 mg total) by mouth daily with breakfast.   glucose blood (ONE TOUCH ULTRA TEST) test strip FOR ONCE DAILY TESTING DX. E11.65   hydrochlorothiazide (HYDRODIURIL) 25 MG tablet Take 1 tablet (25 mg total) by mouth daily.   levocetirizine (XYZAL) 5 MG tablet Take 1 tablet (5 mg total) by mouth every evening.   losartan (COZAAR) 100 MG tablet Take 1 tablet (100 mg total) by mouth daily.   metoCLOPramide (REGLAN) 10 MG tablet Take 1 tablet (10 mg total) by mouth 3 (three) times daily before meals.   OneTouch Delica Lancets 57Q MISC 1 each by Does not apply route daily. Use as directed once daily diag e11.65   oxybutynin (DITROPAN-XL) 10 MG 24 hr tablet Take 1 tablet (10 mg total) by mouth at bedtime.   pantoprazole (PROTONIX) 40 MG tablet Take 1 tablet (40 mg total) by mouth daily.   rosuvastatin (CRESTOR) 5 MG tablet Take 1 tablet (5 mg total) by mouth daily.   zolpidem (AMBIEN) 10 MG tablet Take 1 tablet (10 mg total) by mouth at bedtime as needed for sleep.   zolpidem (AMBIEN) 10 MG tablet TAKE ONE TAB PO QHS FOR INSOMNIA   [DISCONTINUED] escitalopram (LEXAPRO) 10 MG tablet Take one tab po for generalized anxiety disorder   [DISCONTINUED] tiZANidine (ZANAFLEX) 2 MG tablet Take 1 tablet (2 mg  total) by mouth 2 (two) times daily as needed for muscle spasms.   PNEUMOCOCCAL 20-VAL CONJ VACC IM Inject into the muscle. (Patient not taking: Reported on 07/29/2021)   [DISCONTINUED] empagliflozin (JARDIANCE) 25 MG TABS tablet Take 1 tablet (25 mg total) by mouth daily before breakfast. (Patient not taking: Reported on 07/29/2021)   No facility-administered encounter medications on file as of 07/29/2021.    Surgical History: Past Surgical History:  Procedure Laterality Date   ABDOMINAL HYSTERECTOMY     CESAREAN SECTION  1989   CYSTECTOMY  2000   Ovarian   OOPHORECTOMY     OVARIAN CYST SURGERY  2006   RECTOVAGINAL FISTULA CLOSURE  1980     Medical History: Past Medical History:  Diagnosis Date   Anxiety    Arthritis    Depression    Diabetes 1.5, managed as type 2 ([place])    Hypertension    Insomnia    Migraines    Reflux    Sinusitis     Family History: Family History  Problem Relation Age of Onset   Hodgkin's lymphoma Mother    Diabetes Mother    Thyroid disease Mother    Glaucoma Mother    Stroke Brother    Heart disease Brother     Social History   Socioeconomic History   Marital status: Married    Spouse name: Not on file   Number of children: Not on file   Years of education: Not on file   Highest education level: Not on file  Occupational History   Not on file  Tobacco Use   Smoking status: Former    Types: Cigarettes    Quit date: 05/24/2011    Years since quitting: 10.2   Smokeless tobacco: Never  Substance and Sexual Activity   Alcohol use: No   Drug use: No   Sexual activity: Not on file  Other Topics Concern   Not on file  Social History Narrative   Not on file   Social Determinants of Health   Financial Resource Strain: Not on file  Food Insecurity: Not on file  Transportation Needs: Not on file  Physical Activity: Not on file  Stress: Not on file  Social Connections: Not on file  Intimate Partner Violence: Not on file      Review of Systems  Constitutional:  Negative for chills, fatigue and unexpected weight change.  HENT:  Negative for congestion, rhinorrhea, sneezing and sore throat.   Eyes:  Negative for redness.  Respiratory:  Negative for cough, chest tightness and shortness of breath.   Cardiovascular:  Negative for chest pain and palpitations.  Gastrointestinal:  Negative for abdominal pain, constipation, diarrhea, nausea and vomiting.  Genitourinary:  Negative for dysuria and frequency.  Musculoskeletal:  Negative for arthralgias, back pain, joint swelling and neck pain.  Skin:  Negative for rash.  Neurological: Negative.  Negative for tremors and  numbness.  Hematological:  Negative for adenopathy. Does not bruise/bleed easily.  Psychiatric/Behavioral:  Negative for behavioral problems (Depression), sleep disturbance and suicidal ideas. The patient is not nervous/anxious.    Vital Signs: BP 140/63   Pulse 65   Temp 98.2 F (36.8 C)   Resp 16   Ht 5' 8.5" (1.74 m)   Wt 237 lb 12.8 oz (107.9 kg)   SpO2 97%   BMI 35.63 kg/m    Physical Exam Vitals reviewed.  Constitutional:      General: She is not in acute distress.    Appearance:  Normal appearance. She is obese. She is not ill-appearing.  HENT:     Head: Normocephalic and atraumatic.  Eyes:     Pupils: Pupils are equal, round, and reactive to light.  Cardiovascular:     Rate and Rhythm: Normal rate and regular rhythm.  Pulmonary:     Effort: Pulmonary effort is normal. No respiratory distress.  Neurological:     Mental Status: She is alert and oriented to person, place, and time.     Cranial Nerves: No cranial nerve deficit.     Coordination: Coordination normal.     Gait: Gait normal.  Psychiatric:        Mood and Affect: Mood normal.        Behavior: Behavior normal.       Assessment/Plan: 1. Type 2 diabetes mellitus with hyperglycemia, without long-term current use of insulin ([place]) Jardiance discontinued, glimepiride prescribed twice daily with meals.  - glimepiride (AMARYL) 1 MG tablet; Take 1 tablet (1 mg total) by mouth daily with breakfast.  Dispense: 30 tablet; Refill: 2  2. Essential (primary) hypertension Blood pressure is stable, refills ordered.  - hydrochlorothiazide (HYDRODIURIL) 25 MG tablet; Take 1 tablet (25 mg total) by mouth daily.  Dispense: 90 tablet; Refill: 3  3. GERD without esophagitis Patient wants to go back on metoclopramide for now. She is aware of the risk of tardive dyskinesia but the benefit that she gets from this medication outweighs the risks per patient  - metoCLOPramide (REGLAN) 10 MG tablet; Take 1 tablet (10 mg total)  by mouth 3 (three) times daily before meals.  Dispense: 90 tablet; Refill: 2  4. GAD (generalized anxiety disorder) Patient has had increased anxiety, lexapro is helping, no changes.    General Counseling: taleya whitcher understanding of the findings of todays visit and agrees with plan of treatment. I have discussed any further diagnostic evaluation that may be needed or ordered today. We also reviewed her medications today. she has been encouraged to call the office with any questions or concerns that should arise related to todays visit.    No orders of the defined types were placed in this encounter.   Meds ordered this encounter  Medications   metoCLOPramide (REGLAN) 10 MG tablet    Sig: Take 1 tablet (10 mg total) by mouth 3 (three) times daily before meals.    Dispense:  90 tablet    Refill:  2   hydrochlorothiazide (HYDRODIURIL) 25 MG tablet    Sig: Take 1 tablet (25 mg total) by mouth daily.    Dispense:  90 tablet    Refill:  3   glimepiride (AMARYL) 1 MG tablet    Sig: Take 1 tablet (1 mg total) by mouth daily with breakfast.    Dispense:  30 tablet    Refill:  2    Return in about 4 weeks (around 08/26/2021) for F/U, Modine Oppenheimer PCP for changes in medications .   Total time spent:30 Minutes Time spent includes review of chart, medications, test results, and follow up plan with the patient.   [place] Controlled Substance Database was reviewed by me.  This patient was seen by Jonetta Osgood, FNP-C in collaboration with Dr. Clayborn Bigness as a part of collaborative care agreement.   Kreg Earhart R. Valetta Fuller, MSN, FNP-C Internal medicine

## 2021-08-04 ENCOUNTER — Other Ambulatory Visit: Payer: Self-pay | Admitting: Internal Medicine

## 2021-08-04 DIAGNOSIS — F411 Generalized anxiety disorder: Secondary | ICD-10-CM

## 2021-08-13 ENCOUNTER — Other Ambulatory Visit: Payer: Self-pay | Admitting: Physician Assistant

## 2021-08-13 DIAGNOSIS — M546 Pain in thoracic spine: Secondary | ICD-10-CM

## 2021-08-14 ENCOUNTER — Other Ambulatory Visit: Payer: Self-pay

## 2021-08-25 ENCOUNTER — Encounter: Payer: Self-pay | Admitting: Nurse Practitioner

## 2021-08-27 ENCOUNTER — Encounter: Payer: Self-pay | Admitting: Nurse Practitioner

## 2021-08-27 ENCOUNTER — Other Ambulatory Visit: Payer: Self-pay

## 2021-08-27 ENCOUNTER — Ambulatory Visit: Payer: No Typology Code available for payment source | Admitting: Nurse Practitioner

## 2021-08-27 VITALS — BP 105/70 | HR 72 | Temp 98.1°F | Resp 16 | Ht 68.5 in | Wt 235.2 lb

## 2021-08-27 DIAGNOSIS — M25511 Pain in right shoulder: Secondary | ICD-10-CM | POA: Diagnosis not present

## 2021-08-27 DIAGNOSIS — F411 Generalized anxiety disorder: Secondary | ICD-10-CM

## 2021-08-27 DIAGNOSIS — M5010 Cervical disc disorder with radiculopathy, unspecified cervical region: Secondary | ICD-10-CM | POA: Diagnosis not present

## 2021-08-27 DIAGNOSIS — F5101 Primary insomnia: Secondary | ICD-10-CM

## 2021-08-27 DIAGNOSIS — G8929 Other chronic pain: Secondary | ICD-10-CM

## 2021-08-27 MED ORDER — TRAMADOL HCL 50 MG PO TABS: 50.0000 mg | ORAL_TABLET | Freq: Four times a day (QID) | ORAL | 0 refills | Status: DC | PRN

## 2021-08-27 MED ORDER — ESCITALOPRAM OXALATE 20 MG PO TABS: 20.0000 mg | ORAL_TABLET | Freq: Every day | ORAL | 2 refills | Status: DC

## 2021-08-27 MED ORDER — ALPRAZOLAM 0.5 MG PO TABS: ORAL_TABLET | ORAL | 0 refills | Status: DC

## 2021-08-27 MED ORDER — ZOLPIDEM TARTRATE 10 MG PO TABS: 10.0000 mg | ORAL_TABLET | Freq: Every evening | ORAL | 2 refills | Status: DC | PRN

## 2021-08-27 NOTE — Progress Notes (Signed)
[facility] [place], [place] [address]  Internal MEDICINE  Office Visit Note  Patient Name: Valerie Ramos  [id number]  [id number]  Date of Service: 08/27/2021  Chief Complaint  Patient presents with   Follow-up    Discuss meds, upper middle back pain, noticed 2 months ago, getting worse,    Anxiety   Depression   Hypertension   Diabetes    HPI Valerie Ramos presents for a follow up visit for medication review. She was previously started on glimepiride which is helping to control her glucose levels.. She has lost 2 lbs since her previous office visit.  Her blood pressure has been low.  The metoclopramide has been helping her acid reflux.  She reports having mid back pain with a history of scoliosis and she fell off a horse in the past and is hit the doorknob twice as she has chronic back pain    Current Medication: Outpatient Encounter Medications as of 08/27/2021  Medication Sig   acetaminophen (TYLENOL) 325 MG tablet Take 2 tablets (650 mg total) by mouth every 6 (six) hours as needed for mild pain (or Fever >/= 101).   atenolol (TENORMIN) 25 MG tablet Take one tab po qhs for bp   estradiol (ESTRACE) 0.5 MG tablet Take 1 tablet (0.5 mg total) by mouth daily.   fluticasone (FLONASE) 50 MCG/ACT nasal spray SPRAY 2 SPRAYS INTO EACH NOSTRIL EVERY DAY   gabapentin (NEURONTIN) 100 MG capsule One tab po qam and 1-3 tabs at night   glucose blood (ONE TOUCH ULTRA TEST) test strip FOR ONCE DAILY TESTING DX. E11.65   hydrochlorothiazide (HYDRODIURIL) 25 MG tablet Take 1 tablet (25 mg total) by mouth daily.   levocetirizine (XYZAL) 5 MG tablet Take 1 tablet (5 mg total) by mouth every evening.   losartan (COZAAR) 100 MG tablet Take 1 tablet (100 mg total) by mouth daily.   OneTouch Delica Lancets 67Y MISC 1 each by Does not apply route daily. Use as directed once daily diag e11.65   oxybutynin (DITROPAN-XL) 10 MG 24 hr tablet Take 1 tablet (10 mg total) by mouth at bedtime.    pantoprazole (PROTONIX) 40 MG tablet Take 1 tablet (40 mg total) by mouth daily.   PNEUMOCOCCAL 20-VAL CONJ VACC IM Inject into the muscle.   rosuvastatin (CRESTOR) 5 MG tablet Take 1 tablet (5 mg total) by mouth daily.   tiZANidine (ZANAFLEX) 2 MG tablet TAKE 1 TABLET (2 MG TOTAL) BY MOUTH 2 (TWO) TIMES DAILY AS NEEDED FOR MUSCLE SPASMS.   [DISCONTINUED] ALPRAZolam (XANAX) 0.5 MG tablet TAKE HALF TO ONE TAB PO BID PRN FOR ANXIETY ATTACKS  ( DO NOT TAKE THIS MEDICINE EVERY DAY DUE TO DEPENDENCE)   [DISCONTINUED] cyclobenzaprine (FLEXERIL) 5 MG tablet Take 5 mg by mouth at bedtime as needed.   [DISCONTINUED] escitalopram (LEXAPRO) 10 MG tablet TAKE ONE TAB BY MOUTH FOR GENERALIZED ANXIETY DISORDER   [DISCONTINUED] escitalopram (LEXAPRO) 20 MG tablet Take 1 tablet (20 mg total) by mouth daily.   [DISCONTINUED] glimepiride (AMARYL) 1 MG tablet Take 1 tablet (1 mg total) by mouth daily with breakfast.   [DISCONTINUED] metoCLOPramide (REGLAN) 10 MG tablet Take 1 tablet (10 mg total) by mouth 3 (three) times daily before meals.   [DISCONTINUED] traMADol (ULTRAM) 50 MG tablet Take 1 tablet (50 mg total) by mouth every 6 (six) hours as needed for up to 5 days for moderate pain or severe pain.   [DISCONTINUED] zolpidem (AMBIEN) 10 MG tablet TAKE  ONE TAB PO QHS FOR INSOMNIA   ALPRAZolam (XANAX) 0.5 MG tablet TAKE HALF TO ONE TAB PO BID PRN FOR ANXIETY ATTACKS  ( DO NOT TAKE THIS MEDICINE EVERY DAY DUE TO DEPENDENCE)   [DISCONTINUED] ALPRAZolam (XANAX) 0.5 MG tablet Take one tab po bid (Patient not taking: Reported on 08/27/2021)   [DISCONTINUED] zolpidem (AMBIEN) 10 MG tablet Take 1 tablet (10 mg total) by mouth at bedtime as needed for sleep. (Patient not taking: Reported on 08/27/2021)   [DISCONTINUED] zolpidem (AMBIEN) 10 MG tablet Take 1 tablet (10 mg total) by mouth at bedtime as needed for sleep. TAKE ONE TAB PO QHS FOR INSOMNIA   No facility-administered encounter medications on file as of  08/27/2021.    Surgical History: Past Surgical History:  Procedure Laterality Date   ABDOMINAL HYSTERECTOMY     CESAREAN SECTION  1989   CYSTECTOMY  2000   Ovarian   OOPHORECTOMY     OVARIAN CYST SURGERY  2006   RECTOVAGINAL FISTULA CLOSURE  1980    Medical History: Past Medical History:  Diagnosis Date   Anxiety    Arthritis    Depression    Diabetes 1.5, managed as type 2 ([place])    Hypertension    Insomnia    Migraines    Reflux    Sinusitis     Family History: Family History  Problem Relation Age of Onset   Hodgkin's lymphoma Mother    Diabetes Mother    Thyroid disease Mother    Glaucoma Mother    Stroke Brother    Heart disease Brother     Social History   Socioeconomic History   Marital status: Married    Spouse name: Not on file   Number of children: Not on file   Years of education: Not on file   Highest education level: Not on file  Occupational History   Not on file  Tobacco Use   Smoking status: Former    Types: Cigarettes    Quit date: 05/24/2011    Years since quitting: 10.3   Smokeless tobacco: Never  Substance and Sexual Activity   Alcohol use: No   Drug use: No   Sexual activity: Not on file  Other Topics Concern   Not on file  Social History Narrative   Not on file   Social Determinants of Health   Financial Resource Strain: Not on file  Food Insecurity: Not on file  Transportation Needs: Not on file  Physical Activity: Not on file  Stress: Not on file  Social Connections: Not on file  Intimate Partner Violence: Not on file      Review of Systems  Constitutional:  Negative for chills, fatigue and unexpected weight change.  HENT:  Negative for congestion, rhinorrhea, sneezing and sore throat.   Eyes:  Negative for redness.  Respiratory:  Negative for cough, chest tightness and shortness of breath.   Cardiovascular:  Negative for chest pain and palpitations.  Gastrointestinal:  Negative for abdominal pain, constipation,  diarrhea, nausea and vomiting.  Genitourinary:  Negative for dysuria and frequency.  Musculoskeletal:  Negative for arthralgias, back pain, joint swelling and neck pain.  Skin:  Negative for rash.  Neurological: Negative.  Negative for tremors and numbness.  Hematological:  Negative for adenopathy. Does not bruise/bleed easily.  Psychiatric/Behavioral:  Positive for behavioral problems (Depression) and sleep disturbance. Negative for self-injury and suicidal ideas. The patient is nervous/anxious.    Vital Signs: BP 105/70 Comment: 98/40  Pulse 72  Temp 98.1 F (36.7 C)   Resp 16   Ht 5' 8.5" (1.74 m)   Wt 235 lb 3.2 oz (106.7 kg)   SpO2 98%   BMI 35.24 kg/m    Physical Exam Vitals reviewed.  Constitutional:      General: She is not in acute distress.    Appearance: Normal appearance. She is obese. She is not ill-appearing.  HENT:     Head: Normocephalic and atraumatic.  Eyes:     Pupils: Pupils are equal, round, and reactive to light.  Cardiovascular:     Rate and Rhythm: Normal rate and regular rhythm.  Pulmonary:     Effort: Pulmonary effort is normal. No respiratory distress.  Neurological:     Mental Status: She is alert and oriented to person, place, and time.     Cranial Nerves: No cranial nerve deficit.     Coordination: Coordination normal.     Gait: Gait normal.  Psychiatric:        Mood and Affect: Mood normal.        Behavior: Behavior normal.       Assessment/Plan: 1. Cervical disc disorder with radiculopathy, unspecified cervical region Tramadol prescribed.  2. Chronic right shoulder pain Tramadol prescribed.  3. Generalized anxiety disorder Lexapro dose increased, alprazolam refilled  4. Primary insomnia Continue ambien as prescribed, refills ordered.    General Counseling: jakyiah briones understanding of the findings of todays visit and agrees with plan of treatment. I have discussed any further diagnostic evaluation that may be needed or  ordered today. We also reviewed her medications today. she has been encouraged to call the office with any questions or concerns that should arise related to todays visit.    No orders of the defined types were placed in this encounter.   Meds ordered this encounter  Medications   DISCONTD: traMADol (ULTRAM) 50 MG tablet    Sig: Take 1 tablet (50 mg total) by mouth every 6 (six) hours as needed for up to 5 days for moderate pain or severe pain.    Dispense:  20 tablet    Refill:  0   DISCONTD: zolpidem (AMBIEN) 10 MG tablet    Sig: Take 1 tablet (10 mg total) by mouth at bedtime as needed for sleep. TAKE ONE TAB PO QHS FOR INSOMNIA    Dispense:  30 tablet    Refill:  2   DISCONTD: escitalopram (LEXAPRO) 20 MG tablet    Sig: Take 1 tablet (20 mg total) by mouth daily.    Dispense:  30 tablet    Refill:  2    Please note increased dose, 10 mg tablet discontinued.   ALPRAZolam (XANAX) 0.5 MG tablet    Sig: TAKE HALF TO ONE TAB PO BID PRN FOR ANXIETY ATTACKS  ( DO NOT TAKE THIS MEDICINE EVERY DAY DUE TO DEPENDENCE)    Dispense:  45 tablet    Refill:  0    Return in about 1 month (around 09/26/2021) for F/U, eval new med, Altheria Shadoan PCP.   Total time spent:30 Minutes Time spent includes review of chart, medications, test results, and follow up plan with the patient.   [place] Controlled Substance Database was reviewed by me.  This patient was seen by Jonetta Osgood, FNP-C in collaboration with Dr. Clayborn Bigness as a part of collaborative care agreement.   Pistol Kessenich R. Valetta Fuller, MSN, FNP-C Internal medicine

## 2021-09-19 ENCOUNTER — Ambulatory Visit (INDEPENDENT_AMBULATORY_CARE_PROVIDER_SITE_OTHER): Payer: No Typology Code available for payment source | Admitting: Vascular Surgery

## 2021-09-24 ENCOUNTER — Telehealth: Payer: No Typology Code available for payment source | Admitting: Nurse Practitioner

## 2021-09-24 ENCOUNTER — Other Ambulatory Visit: Payer: Self-pay

## 2021-09-24 ENCOUNTER — Other Ambulatory Visit: Payer: Self-pay | Admitting: Internal Medicine

## 2021-09-24 ENCOUNTER — Encounter: Payer: Self-pay | Admitting: Nurse Practitioner

## 2021-09-24 VITALS — BP 153/80

## 2021-09-24 DIAGNOSIS — M549 Dorsalgia, unspecified: Secondary | ICD-10-CM | POA: Diagnosis not present

## 2021-09-24 DIAGNOSIS — G43009 Migraine without aura, not intractable, without status migrainosus: Secondary | ICD-10-CM

## 2021-09-24 DIAGNOSIS — E1165 Type 2 diabetes mellitus with hyperglycemia: Secondary | ICD-10-CM | POA: Diagnosis not present

## 2021-09-24 DIAGNOSIS — K219 Gastro-esophageal reflux disease without esophagitis: Secondary | ICD-10-CM | POA: Diagnosis not present

## 2021-09-24 MED ORDER — GLIMEPIRIDE 1 MG PO TABS: 1.0000 mg | ORAL_TABLET | Freq: Every day | ORAL | 2 refills | Status: DC

## 2021-09-24 MED ORDER — TRAMADOL HCL 50 MG PO TABS: 50.0000 mg | ORAL_TABLET | Freq: Two times a day (BID) | ORAL | 0 refills | Status: DC | PRN

## 2021-09-24 MED ORDER — METOCLOPRAMIDE HCL 10 MG PO TABS: 10.0000 mg | ORAL_TABLET | Freq: Three times a day (TID) | ORAL | 2 refills | Status: DC

## 2021-09-24 MED ORDER — SUMATRIPTAN SUCCINATE 50 MG PO TABS: 50.0000 mg | ORAL_TABLET | ORAL | 0 refills | Status: DC | PRN

## 2021-09-24 NOTE — Telephone Encounter (Signed)
Med sent to pharmacy.

## 2021-09-24 NOTE — Progress Notes (Signed)
[facility] [place], [place] [address]  Internal MEDICINE  Telephone Visit  Patient Name: Valerie Ramos  [id number]  [id number]  Date of Service: 09/24/2021  I connected with the patient at 10:15 AM by telephone and verified the patients identity using two identifiers.   I discussed the limitations, risks, security and privacy concerns of performing an evaluation and management service by telephone and the availability of in person appointments. I also discussed with the patient that there may be a patient responsible charge related to the service.  The patient expressed understanding and agrees to proceed.    Chief Complaint  Patient presents with   Telephone Assessment   Follow-up    Needs some more medication for back pain and headaches   Quality Metric Gaps    Mammogram, Colonoscopy    HPI [name] presents for a telehealth virtual visit for a follow-up visit for starting tramadol as needed.  She was seen for a previous office visit on November 29 and was having upper mid back pain that she has noticed for the past couple of months and that it has been getting worse.  Today, patient reports that she needs more medication for her back pain and headaches.  She is also due for routine colonoscopy. She was given Roselyn Meier in office for migraine headache at her previous office visit. She would like to get a prescription for this medication or something covered by her insurance that will help whne she has an acute migraine headache.   Current Medication: Outpatient Encounter Medications as of 09/24/2021  Medication Sig   acetaminophen (TYLENOL) 325 MG tablet Take 2 tablets (650 mg total) by mouth every 6 (six) hours as needed for mild pain (or Fever >/= 101).   ALPRAZolam (XANAX) 0.5 MG tablet TAKE HALF TO ONE TAB PO BID PRN FOR ANXIETY ATTACKS  ( DO NOT TAKE THIS MEDICINE EVERY DAY DUE TO DEPENDENCE)   atenolol (TENORMIN) 25 MG tablet Take one tab po qhs for bp    escitalopram (LEXAPRO) 20 MG tablet Take 1 tablet (20 mg total) by mouth daily.   estradiol (ESTRACE) 0.5 MG tablet Take 1 tablet (0.5 mg total) by mouth daily.   fluticasone (FLONASE) 50 MCG/ACT nasal spray SPRAY 2 SPRAYS INTO EACH NOSTRIL EVERY DAY   gabapentin (NEURONTIN) 100 MG capsule One tab po qam and 1-3 tabs at night   glucose blood (ONE TOUCH ULTRA TEST) test strip FOR ONCE DAILY TESTING DX. E11.65   hydrochlorothiazide (HYDRODIURIL) 25 MG tablet Take 1 tablet (25 mg total) by mouth daily.   levocetirizine (XYZAL) 5 MG tablet Take 1 tablet (5 mg total) by mouth every evening.   losartan (COZAAR) 100 MG tablet Take 1 tablet (100 mg total) by mouth daily.   OneTouch Delica Lancets 14E MISC 1 each by Does not apply route daily. Use as directed once daily diag e11.65   oxybutynin (DITROPAN-XL) 10 MG 24 hr tablet Take 1 tablet (10 mg total) by mouth at bedtime.   pantoprazole (PROTONIX) 40 MG tablet Take 1 tablet (40 mg total) by mouth daily.   PNEUMOCOCCAL 20-VAL CONJ VACC IM Inject into the muscle.   rosuvastatin (CRESTOR) 5 MG tablet Take 1 tablet (5 mg total) by mouth daily.   SUMAtriptan (IMITREX) 50 MG tablet Take 1 tablet (50 mg total) by mouth every 2 (two) hours as needed for migraine. May repeat in 2 hours if headache persists or recurs. Limit to 2 doses per 24 hours.  tiZANidine (ZANAFLEX) 2 MG tablet TAKE 1 TABLET (2 MG TOTAL) BY MOUTH 2 (TWO) TIMES DAILY AS NEEDED FOR MUSCLE SPASMS.   zolpidem (AMBIEN) 10 MG tablet Take 1 tablet (10 mg total) by mouth at bedtime as needed for sleep. TAKE ONE TAB PO QHS FOR INSOMNIA   [DISCONTINUED] cyclobenzaprine (FLEXERIL) 5 MG tablet Take 5 mg by mouth at bedtime as needed.   [DISCONTINUED] glimepiride (AMARYL) 1 MG tablet Take 1 tablet (1 mg total) by mouth daily with breakfast.   [DISCONTINUED] metoCLOPramide (REGLAN) 10 MG tablet Take 1 tablet (10 mg total) by mouth 3 (three) times daily before meals.   glimepiride (AMARYL) 1 MG tablet  Take 1 tablet (1 mg total) by mouth daily with breakfast.   metFORMIN (GLUCOPHAGE) 500 MG tablet Take 500 mg by mouth 2 (two) times daily.   metoCLOPramide (REGLAN) 10 MG tablet Take 1 tablet (10 mg total) by mouth 3 (three) times daily before meals.   traMADol (ULTRAM) 50 MG tablet Take 1 tablet (50 mg total) by mouth every 12 (twelve) hours as needed for moderate pain or severe pain.   No facility-administered encounter medications on file as of 09/24/2021.    Surgical History: Past Surgical History:  Procedure Laterality Date   ABDOMINAL HYSTERECTOMY     CESAREAN SECTION  1989   CYSTECTOMY  2000   Ovarian   OOPHORECTOMY     OVARIAN CYST SURGERY  2006   RECTOVAGINAL FISTULA CLOSURE  1980    Medical History: Past Medical History:  Diagnosis Date   Anxiety    Arthritis    Depression    Diabetes 1.5, managed as type 2 ([place])    Hypertension    Insomnia    Migraines    Reflux    Sinusitis     Family History: Family History  Problem Relation Age of Onset   Hodgkin's lymphoma Mother    Diabetes Mother    Thyroid disease Mother    Glaucoma Mother    Stroke Brother    Heart disease Brother     Social History   Socioeconomic History   Marital status: Married    Spouse name: Not on file   Number of children: Not on file   Years of education: Not on file   Highest education level: Not on file  Occupational History   Not on file  Tobacco Use   Smoking status: Former    Types: Cigarettes    Quit date: 05/24/2011    Years since quitting: 10.3   Smokeless tobacco: Never  Substance and Sexual Activity   Alcohol use: No   Drug use: No   Sexual activity: Not on file  Other Topics Concern   Not on file  Social History Narrative   Not on file   Social Determinants of Health   Financial Resource Strain: Not on file  Food Insecurity: Not on file  Transportation Needs: Not on file  Physical Activity: Not on file  Stress: Not on file  Social Connections: Not on  file  Intimate Partner Violence: Not on file      Review of Systems  Constitutional:  Negative for chills, fatigue and unexpected weight change.  HENT:  Negative for congestion, rhinorrhea, sneezing and sore throat.   Eyes:  Negative for redness.  Respiratory:  Negative for cough, chest tightness and shortness of breath.   Cardiovascular:  Negative for chest pain and palpitations.  Gastrointestinal:  Negative for abdominal pain, constipation, diarrhea, nausea and vomiting.  Genitourinary:  Negative for dysuria and frequency.  Musculoskeletal:  Positive for arthralgias and back pain. Negative for joint swelling and neck pain.  Skin:  Negative for rash.  Neurological: Negative.  Negative for tremors and numbness.  Hematological:  Negative for adenopathy. Does not bruise/bleed easily.  Psychiatric/Behavioral:  Negative for behavioral problems (Depression), sleep disturbance and suicidal ideas. The patient is not nervous/anxious.    Vital Signs: BP (!) 153/80    Observation/Objective: She is alert and oriented and engages in conversation appropriately.  She does not appear to be in any acute distress over video call.    Assessment/Plan: 1. Mid back pain Tramadol has been helping her back, she has been taking it up to 2 times a day. Refill ordered - traMADol (ULTRAM) 50 MG tablet; Take 1 tablet (50 mg total) by mouth every 12 (twelve) hours as needed for moderate pain or severe pain.  Dispense: 60 tablet; Refill: 0  2. Migraine without aura and without status migrainosus, not intractable Patient is on atenolol for BP and gabapentin at night and both maedications are used as preventives for migraine headaches. She is not yet on anything to treat acute migraine headache, will trial sumatriptan and discuss at her office visit next month.  - SUMAtriptan (IMITREX) 50 MG tablet; Take 1 tablet (50 mg total) by mouth every 2 (two) hours as needed for migraine. May repeat in 2 hours if headache  persists or recurs. Limit to 2 doses per 24 hours.  Dispense: 10 tablet; Refill: 0  3. Type 2 diabetes mellitus with hyperglycemia, without long-term current use of insulin (HCC) Refills ordered, follow up in 1 month for A1C - metFORMIN (GLUCOPHAGE) 500 MG tablet; Take 500 mg by mouth 2 (two) times daily. - glimepiride (AMARYL) 1 MG tablet; Take 1 tablet (1 mg total) by mouth daily with breakfast.  Dispense: 30 tablet; Refill: 2  4. GERD without esophagitis Stable, refills ordered - metoCLOPramide (REGLAN) 10 MG tablet; Take 1 tablet (10 mg total) by mouth 3 (three) times daily before meals.  Dispense: 90 tablet; Refill: 2   General Counseling: Charise verbalizes understanding of the findings of today's phone visit and agrees with plan of treatment. I have discussed any further diagnostic evaluation that may be needed or ordered today. We also reviewed her medications today. she has been encouraged to call the office with any questions or concerns that should arise related to todays visit.  Return in about 1 month (around 10/25/2021) for F/U, Recheck A1C, Neftaly Swiss PCP.   No orders of the defined types were placed in this encounter.   Meds ordered this encounter  Medications   glimepiride (AMARYL) 1 MG tablet    Sig: Take 1 tablet (1 mg total) by mouth daily with breakfast.    Dispense:  30 tablet    Refill:  2   metoCLOPramide (REGLAN) 10 MG tablet    Sig: Take 1 tablet (10 mg total) by mouth 3 (three) times daily before meals.    Dispense:  90 tablet    Refill:  2   traMADol (ULTRAM) 50 MG tablet    Sig: Take 1 tablet (50 mg total) by mouth every 12 (twelve) hours as needed for moderate pain or severe pain.    Dispense:  60 tablet    Refill:  0   SUMAtriptan (IMITREX) 50 MG tablet    Sig: Take 1 tablet (50 mg total) by mouth every 2 (two) hours as needed for migraine. May repeat in 2  hours if headache persists or recurs. Limit to 2 doses per 24 hours.    Dispense:  10 tablet     Refill:  0    Time spent:15 Minutes Time spent with patient included reviewing progress notes, labs, imaging studies, and discussing plan for follow up.  [place] Controlled Substance Database was reviewed by me for overdose risk score (ORS) if appropriate.  This patient was seen by Jonetta Osgood, FNP-C in collaboration with Dr. Clayborn Bigness as a part of collaborative care agreement.  Dayln Tugwell R. Valetta Fuller, MSN, FNP-C Internal medicine

## 2021-09-25 ENCOUNTER — Other Ambulatory Visit: Payer: Self-pay | Admitting: Nurse Practitioner

## 2021-09-27 ENCOUNTER — Encounter: Payer: Self-pay | Admitting: Nurse Practitioner

## 2021-10-21 ENCOUNTER — Other Ambulatory Visit: Payer: Self-pay | Admitting: Nurse Practitioner

## 2021-10-21 DIAGNOSIS — G43009 Migraine without aura, not intractable, without status migrainosus: Secondary | ICD-10-CM

## 2021-10-22 NOTE — Telephone Encounter (Signed)
Med sent to pharmacy.

## 2021-10-24 ENCOUNTER — Other Ambulatory Visit: Payer: Self-pay | Admitting: Internal Medicine

## 2021-10-24 DIAGNOSIS — E1165 Type 2 diabetes mellitus with hyperglycemia: Secondary | ICD-10-CM

## 2021-10-28 ENCOUNTER — Ambulatory Visit: Payer: No Typology Code available for payment source | Admitting: Nurse Practitioner

## 2021-10-28 ENCOUNTER — Encounter: Payer: Self-pay | Admitting: Nurse Practitioner

## 2021-10-28 ENCOUNTER — Other Ambulatory Visit: Payer: Self-pay

## 2021-10-28 VITALS — BP 120/74 | HR 67 | Temp 97.2°F | Resp 16 | Ht 68.5 in | Wt 238.8 lb

## 2021-10-28 DIAGNOSIS — Z1231 Encounter for screening mammogram for malignant neoplasm of breast: Secondary | ICD-10-CM | POA: Diagnosis not present

## 2021-10-28 DIAGNOSIS — J0110 Acute frontal sinusitis, unspecified: Secondary | ICD-10-CM

## 2021-10-28 DIAGNOSIS — Z1211 Encounter for screening for malignant neoplasm of colon: Secondary | ICD-10-CM | POA: Diagnosis not present

## 2021-10-28 DIAGNOSIS — Z1212 Encounter for screening for malignant neoplasm of rectum: Secondary | ICD-10-CM

## 2021-10-28 DIAGNOSIS — E1165 Type 2 diabetes mellitus with hyperglycemia: Secondary | ICD-10-CM | POA: Diagnosis not present

## 2021-10-28 DIAGNOSIS — Z79899 Other long term (current) drug therapy: Secondary | ICD-10-CM

## 2021-10-28 LAB — POCT URINE DRUG SCREEN
Methylenedioxyamphetamine: NOT DETECTED
POC Amphetamine UR: NOT DETECTED
POC BENZODIAZEPINES UR: POSITIVE — AB
POC Barbiturate UR: NOT DETECTED
POC Cocaine UR: NOT DETECTED
POC Ecstasy UR: NOT DETECTED
POC Marijuana UR: NOT DETECTED
POC Methadone UR: NOT DETECTED
POC Methamphetamine UR: NOT DETECTED
POC Opiate Ur: NOT DETECTED
POC Oxycodone UR: NOT DETECTED
POC PHENCYCLIDINE UR: NOT DETECTED
POC TRICYCLICS UR: NOT DETECTED

## 2021-10-28 LAB — POCT GLYCOSYLATED HEMOGLOBIN (HGB A1C): Hemoglobin A1C: 7 % — AB (ref 4.0–5.6)

## 2021-10-28 MED ORDER — AMOXICILLIN-POT CLAVULANATE 875-125 MG PO TABS: 1.0000 | ORAL_TABLET | Freq: Two times a day (BID) | ORAL | 0 refills | Status: AC

## 2021-10-28 NOTE — Progress Notes (Signed)
[facility] [place], [place] [address]  Internal MEDICINE  Office Visit Note  Patient Name: Valerie Ramos  [id number]  [id number]  Date of Service: 10/28/2021  Chief Complaint  Patient presents with   Follow-up    Lymph node left side of neck was sore Saturday/started last week,     Depression   Diabetes   Hypertension   Anxiety   Ear Pain    Right ear pain for about a week, pain comes and goes     HPI Valerie Ramos presents for a follow up visit for diabetes and repeat A1C. She also reports having a runny nose, swollen lymph node and right ear pain that comes and goes x1 week now.  She is due for a routine colonoscopy and would like to be referred to the same office that her husband goes to for colonoscopy.  Her A1C is 7.0 today which is improved by 0.4 since her previous A1C in october 2022 of 7.4. She continues to work on diet modifications.  Worries she is developing a sinus infection, would like to get an antibiotic   Current Medication: Outpatient Encounter Medications as of 10/28/2021  Medication Sig   acetaminophen (TYLENOL) 325 MG tablet Take 2 tablets (650 mg total) by mouth every 6 (six) hours as needed for mild pain (or Fever >/= 101).   ALPRAZolam (XANAX) 0.5 MG tablet TAKE 1/2-1 TAB BY MOUTH TWICE DAILY AS NEEDED FOR ANXIETY ATTACKS (DO NOT TAKE DAILY DUE TO DEPENDENCE)   amoxicillin-clavulanate (AUGMENTIN) 875-125 MG tablet Take 1 tablet by mouth 2 (two) times daily for 7 days.   atenolol (TENORMIN) 25 MG tablet Take one tab po qhs for bp   escitalopram (LEXAPRO) 20 MG tablet TAKE 1 TABLET BY MOUTH EVERY DAY   estradiol (ESTRACE) 0.5 MG tablet Take 1 tablet (0.5 mg total) by mouth daily.   fluticasone (FLONASE) 50 MCG/ACT nasal spray SPRAY 2 SPRAYS INTO EACH NOSTRIL EVERY DAY   gabapentin (NEURONTIN) 100 MG capsule TAKE 1 CAPSULE BY MOUTH EVERY MORNING AND 1-3 CAPSULES AT NIGHT   glimepiride (AMARYL) 1 MG tablet Take 1 tablet (1 mg total) by mouth  daily with breakfast.   glucose blood (ONE TOUCH ULTRA TEST) test strip FOR ONCE DAILY TESTING DX. E11.65   hydrochlorothiazide (HYDRODIURIL) 25 MG tablet Take 1 tablet (25 mg total) by mouth daily.   levocetirizine (XYZAL) 5 MG tablet Take 1 tablet (5 mg total) by mouth every evening.   losartan (COZAAR) 100 MG tablet Take 1 tablet (100 mg total) by mouth daily.   metFORMIN (GLUCOPHAGE) 500 MG tablet Take 500 mg by mouth 2 (two) times daily.   metoCLOPramide (REGLAN) 10 MG tablet Take 1 tablet (10 mg total) by mouth 3 (three) times daily before meals.   OneTouch Delica Lancets 83T MISC 1 each by Does not apply route daily. Use as directed once daily diag e11.65   oxybutynin (DITROPAN-XL) 10 MG 24 hr tablet Take 1 tablet (10 mg total) by mouth at bedtime.   pantoprazole (PROTONIX) 40 MG tablet Take 1 tablet (40 mg total) by mouth daily.   PNEUMOCOCCAL 20-VAL CONJ VACC IM Inject into the muscle.   rosuvastatin (CRESTOR) 5 MG tablet Take 1 tablet (5 mg total) by mouth daily.   SUMAtriptan (IMITREX) 50 MG tablet TAKE 1 TABLET EVERY 2 HOURS AS NEEDED FOR MIGRAINE. MAY REPEAT IN 2 HOURS IF HEADACHE PERSISTS OR RECURS. LIMIT TO 2 DOSES PER 24 HOURS   tiZANidine (ZANAFLEX)  2 MG tablet TAKE 1 TABLET (2 MG TOTAL) BY MOUTH 2 (TWO) TIMES DAILY AS NEEDED FOR MUSCLE SPASMS.   traMADol (ULTRAM) 50 MG tablet Take 1 tablet (50 mg total) by mouth every 12 (twelve) hours as needed for moderate pain or severe pain.   zolpidem (AMBIEN) 10 MG tablet TAKE 1 TABLET BY MOUTH AT BEDTIME FOR INSOMNIA   No facility-administered encounter medications on file as of 10/28/2021.    Surgical History: Past Surgical History:  Procedure Laterality Date   ABDOMINAL HYSTERECTOMY     CESAREAN SECTION  1989   CYSTECTOMY  2000   Ovarian   OOPHORECTOMY     OVARIAN CYST SURGERY  2006   RECTOVAGINAL FISTULA CLOSURE  1980    Medical History: Past Medical History:  Diagnosis Date   Anxiety    Arthritis    Depression     Diabetes 1.5, managed as type 2 ([place])    Hypertension    Insomnia    Migraines    Reflux    Sinusitis     Family History: Family History  Problem Relation Age of Onset   Hodgkin's lymphoma Mother    Diabetes Mother    Thyroid disease Mother    Glaucoma Mother    Stroke Brother    Heart disease Brother     Social History   Socioeconomic History   Marital status: Married    Spouse name: Not on file   Number of children: Not on file   Years of education: Not on file   Highest education level: Not on file  Occupational History   Not on file  Tobacco Use   Smoking status: Former    Types: Cigarettes    Quit date: 05/24/2011    Years since quitting: 10.4   Smokeless tobacco: Never  Substance and Sexual Activity   Alcohol use: No   Drug use: No   Sexual activity: Not on file  Other Topics Concern   Not on file  Social History Narrative   Not on file   Social Determinants of Health   Financial Resource Strain: Not on file  Food Insecurity: Not on file  Transportation Needs: Not on file  Physical Activity: Not on file  Stress: Not on file  Social Connections: Not on file  Intimate Partner Violence: Not on file      Review of Systems  Constitutional:  Negative for chills, fatigue and unexpected weight change.  HENT:  Positive for ear pain, postnasal drip, rhinorrhea, sinus pressure, sinus pain, sneezing and sore throat. Negative for congestion.   Eyes:  Negative for redness.  Respiratory:  Positive for cough and wheezing. Negative for chest tightness and shortness of breath.   Cardiovascular:  Negative for chest pain and palpitations.  Gastrointestinal:  Negative for abdominal pain, constipation, diarrhea, nausea and vomiting.  Genitourinary:  Negative for dysuria and frequency.  Musculoskeletal:  Negative for arthralgias, back pain, joint swelling and neck pain.  Skin:  Negative for rash.  Neurological: Negative.  Negative for tremors and numbness.   Hematological:  Negative for adenopathy. Does not bruise/bleed easily.  Psychiatric/Behavioral:  Negative for behavioral problems (Depression), sleep disturbance and suicidal ideas. The patient is not nervous/anxious.    Vital Signs: BP 120/74    Pulse 67    Temp (!) 97.2 F (36.2 C)    Resp 16    Ht 5' 8.5" (1.74 m)    Wt 238 lb 12.8 oz (108.3 kg)    SpO2 97%  BMI 35.78 kg/m    Physical Exam Vitals reviewed.  Constitutional:      General: She is not in acute distress.    Appearance: Normal appearance. She is obese. She is not ill-appearing.  HENT:     Head: Normocephalic and atraumatic.  Eyes:     Pupils: Pupils are equal, round, and reactive to light.  Cardiovascular:     Rate and Rhythm: Normal rate and regular rhythm.  Abdominal:     General: Bowel sounds are normal.  Neurological:     Mental Status: She is alert and oriented to person, place, and time.     Cranial Nerves: No cranial nerve deficit.     Coordination: Coordination normal.  Psychiatric:        Mood and Affect: Mood normal.        Behavior: Behavior normal.       Assessment/Plan: 1. Type 2 diabetes mellitus with hyperglycemia, without long-term current use of insulin (HCC) A1C is improving, continue medications as prescribed,  - POCT HgB A1C  2. Encounter for long-term (current) use of medications Positive for benzos which is consistent with current  - POCT Urine Drug Screen  3. Screening for colorectal cancer Refer to GI for routine colonoscopy - Ambulatory referral to Gastroenterology  4. Encounter for screening mammogram for malignant neoplasm of breast Routine mammogram ordered.  - MM 3D SCREEN BREAST BILATERAL; Future  5. Acute non-recurrent frontal sinusitis Empiric antibiotic treatment prescribed. Return to clinic if no improvement in at least 3 days after starting the antibiotic.  - amoxicillin-clavulanate (AUGMENTIN) 875-125 MG tablet; Take 1 tablet by mouth 2 (two) times daily for 7  days.  Dispense: 14 tablet; Refill: 0   General Counseling: Valerie Ramos verbalizes understanding of the findings of todays visit and agrees with plan of treatment. I have discussed any further diagnostic evaluation that may be needed or ordered today. We also reviewed her medications today. she has been encouraged to call the office with any questions or concerns that should arise related to todays visit.    Orders Placed This Encounter  Procedures   MM 3D SCREEN BREAST BILATERAL   Ambulatory referral to Gastroenterology   POCT HgB A1C   POCT Urine Drug Screen    Meds ordered this encounter  Medications   amoxicillin-clavulanate (AUGMENTIN) 875-125 MG tablet    Sig: Take 1 tablet by mouth 2 (two) times daily for 7 days.    Dispense:  14 tablet    Refill:  0    Return in about 3 months (around 01/26/2022) for F/U, Recheck A1C, Nahsir Venezia PCP.   Total time spent:30 Minutes Time spent includes review of chart, medications, test results, and follow up plan with the patient.   [place] Controlled Substance Database was reviewed by me.  This patient was seen by Jonetta Osgood, FNP-C in collaboration with Dr. Clayborn Bigness as a part of collaborative care agreement.   Kawon Willcutt R. Valetta Fuller, MSN, FNP-C Internal medicine

## 2021-10-29 ENCOUNTER — Other Ambulatory Visit: Payer: Self-pay

## 2021-10-29 DIAGNOSIS — Z1211 Encounter for screening for malignant neoplasm of colon: Secondary | ICD-10-CM

## 2021-10-29 MED ORDER — NA SULFATE-K SULFATE-MG SULF 17.5-3.13-1.6 GM/177ML PO SOLN: 1.0000 | Freq: Once | ORAL | 0 refills | Status: AC

## 2021-10-29 NOTE — Progress Notes (Signed)
Gastroenterology Pre-Procedure Review  Request Date: 11/28/2021 Requesting Physician: Dr. Marius Ditch   PATIENT REVIEW QUESTIONS: The patient responded to the following health history questions as indicated:    1. Are you having any GI issues? no 2. Do you have a personal history of Polyps? Yes last colonoscopy 3. Do you have a family history of Colon Cancer or Polyps? no 4. Diabetes Mellitus? yes (type 2) 5. Joint replacements in the past 12 months?no 6. Major health problems in the past 3 months?no 7. Any artificial heart valves, MVP, or defibrillator?no    MEDICATIONS & ALLERGIES:    Patient reports the following regarding taking any anticoagulation/antiplatelet therapy:   Plavix, Coumadin, Eliquis, Xarelto, Lovenox, Pradaxa, Brilinta, or Effient? no Aspirin? no  Patient confirms/reports the following medications:  Current Outpatient Medications  Medication Sig Dispense Refill   acetaminophen (TYLENOL) 325 MG tablet Take 2 tablets (650 mg total) by mouth every 6 (six) hours as needed for mild pain (or Fever >/= 101).     ALPRAZolam (XANAX) 0.5 MG tablet TAKE 1/2-1 TAB BY MOUTH TWICE DAILY AS NEEDED FOR ANXIETY ATTACKS (DO NOT TAKE DAILY DUE TO DEPENDENCE) 45 tablet 0   amoxicillin-clavulanate (AUGMENTIN) 875-125 MG tablet Take 1 tablet by mouth 2 (two) times daily for 7 days. 14 tablet 0   atenolol (TENORMIN) 25 MG tablet Take one tab po qhs for bp 90 tablet 3   escitalopram (LEXAPRO) 20 MG tablet TAKE 1 TABLET BY MOUTH EVERY DAY 90 tablet 1   estradiol (ESTRACE) 0.5 MG tablet Take 1 tablet (0.5 mg total) by mouth daily. 90 tablet 3   fluticasone (FLONASE) 50 MCG/ACT nasal spray SPRAY 2 SPRAYS INTO EACH NOSTRIL EVERY DAY 48 mL 1   gabapentin (NEURONTIN) 100 MG capsule TAKE 1 CAPSULE BY MOUTH EVERY MORNING AND 1-3 CAPSULES AT NIGHT 90 capsule 3   glimepiride (AMARYL) 1 MG tablet Take 1 tablet (1 mg total) by mouth daily with breakfast. 30 tablet 2   glucose blood (ONE TOUCH ULTRA TEST)  test strip FOR ONCE DAILY TESTING DX. E11.65 100 each 3   hydrochlorothiazide (HYDRODIURIL) 25 MG tablet Take 1 tablet (25 mg total) by mouth daily. 90 tablet 3   levocetirizine (XYZAL) 5 MG tablet Take 1 tablet (5 mg total) by mouth every evening. 90 tablet 0   losartan (COZAAR) 100 MG tablet Take 1 tablet (100 mg total) by mouth daily. 90 tablet 1   metFORMIN (GLUCOPHAGE) 500 MG tablet Take 500 mg by mouth 2 (two) times daily.     metoCLOPramide (REGLAN) 10 MG tablet Take 1 tablet (10 mg total) by mouth 3 (three) times daily before meals. 90 tablet 2   OneTouch Delica Lancets 30Z MISC 1 each by Does not apply route daily. Use as directed once daily diag e11.65 100 each 3   oxybutynin (DITROPAN-XL) 10 MG 24 hr tablet Take 1 tablet (10 mg total) by mouth at bedtime. 90 tablet 1   pantoprazole (PROTONIX) 40 MG tablet Take 1 tablet (40 mg total) by mouth daily. 90 tablet 3   PNEUMOCOCCAL 20-VAL CONJ VACC IM Inject into the muscle.     rosuvastatin (CRESTOR) 5 MG tablet Take 1 tablet (5 mg total) by mouth daily. 30 tablet 3   SUMAtriptan (IMITREX) 50 MG tablet TAKE 1 TABLET EVERY 2 HOURS AS NEEDED FOR MIGRAINE. MAY REPEAT IN 2 HOURS IF HEADACHE PERSISTS OR RECURS. LIMIT TO 2 DOSES PER 24 HOURS 10 tablet 3   tiZANidine (ZANAFLEX) 2 MG tablet TAKE  1 TABLET (2 MG TOTAL) BY MOUTH 2 (TWO) TIMES DAILY AS NEEDED FOR MUSCLE SPASMS. 45 tablet 3   traMADol (ULTRAM) 50 MG tablet Take 1 tablet (50 mg total) by mouth every 12 (twelve) hours as needed for moderate pain or severe pain. 60 tablet 0   zolpidem (AMBIEN) 10 MG tablet TAKE 1 TABLET BY MOUTH AT BEDTIME FOR INSOMNIA 30 tablet 1   No current facility-administered medications for this visit.    Patient confirms/reports the following allergies:  Allergies  Allergen Reactions   Sulfa Antibiotics Hives   Oxycodone-Acetaminophen Itching    percocet    No orders of the defined types were placed in this encounter.   AUTHORIZATION INFORMATION Primary  Insurance: 1D#: Group #:  Secondary Insurance: 1D#: Group #:  SCHEDULE INFORMATION: Date: 11/28/2021 Time: Location:[facility]

## 2021-11-08 ENCOUNTER — Other Ambulatory Visit: Payer: Self-pay | Admitting: Physician Assistant

## 2021-11-08 ENCOUNTER — Other Ambulatory Visit: Payer: Self-pay | Admitting: Internal Medicine

## 2021-11-08 DIAGNOSIS — I10 Essential (primary) hypertension: Secondary | ICD-10-CM

## 2021-11-08 DIAGNOSIS — K219 Gastro-esophageal reflux disease without esophagitis: Secondary | ICD-10-CM

## 2021-11-10 ENCOUNTER — Other Ambulatory Visit: Payer: Self-pay | Admitting: Physician Assistant

## 2021-11-10 DIAGNOSIS — M546 Pain in thoracic spine: Secondary | ICD-10-CM

## 2021-11-20 ENCOUNTER — Telehealth: Payer: Self-pay | Admitting: Gastroenterology

## 2021-11-20 NOTE — Telephone Encounter (Signed)
Pt has colonoscopy sched for 11/28/2021 And had a few question in ref to her packet she received

## 2021-11-21 ENCOUNTER — Telehealth: Payer: Self-pay

## 2021-11-21 SURGERY — COLONOSCOPY WITH PROPOFOL
Anesthesia: General

## 2021-11-21 NOTE — Telephone Encounter (Signed)
Patient called I returned cal and answered all questions and patient understands and is ready for her procedure

## 2021-11-25 ENCOUNTER — Other Ambulatory Visit: Payer: Self-pay | Admitting: Nurse Practitioner

## 2021-11-25 ENCOUNTER — Other Ambulatory Visit: Payer: Self-pay | Admitting: Physician Assistant

## 2021-11-25 DIAGNOSIS — E1142 Type 2 diabetes mellitus with diabetic polyneuropathy: Secondary | ICD-10-CM

## 2021-11-27 ENCOUNTER — Telehealth: Payer: Self-pay

## 2021-11-27 NOTE — Telephone Encounter (Signed)
Med sent to pharmacy.

## 2021-11-28 ENCOUNTER — Encounter
Admission: RE | Disposition: A | Discharge status: Home / Self Care | Payer: Self-pay | Source: Home / Self Care | Attending: Gastroenterology

## 2021-11-28 ENCOUNTER — Ambulatory Visit: Payer: No Typology Code available for payment source | Admitting: Anesthesiology

## 2021-11-28 ENCOUNTER — Ambulatory Visit
Admission: RE | Admit: 2021-11-28 | Discharge: 2021-11-28 | Disposition: A | Discharge status: Home / Self Care | Payer: No Typology Code available for payment source | Attending: Gastroenterology | Admitting: Gastroenterology

## 2021-11-28 ENCOUNTER — Encounter: Payer: Self-pay | Admitting: Gastroenterology

## 2021-11-28 ENCOUNTER — Other Ambulatory Visit: Payer: Self-pay

## 2021-11-28 DIAGNOSIS — K219 Gastro-esophageal reflux disease without esophagitis: Secondary | ICD-10-CM | POA: Insufficient documentation

## 2021-11-28 DIAGNOSIS — I10 Essential (primary) hypertension: Secondary | ICD-10-CM | POA: Diagnosis not present

## 2021-11-28 DIAGNOSIS — Z87891 Personal history of nicotine dependence: Secondary | ICD-10-CM | POA: Diagnosis not present

## 2021-11-28 DIAGNOSIS — G43909 Migraine, unspecified, not intractable, without status migrainosus: Secondary | ICD-10-CM | POA: Insufficient documentation

## 2021-11-28 DIAGNOSIS — F418 Other specified anxiety disorders: Secondary | ICD-10-CM | POA: Insufficient documentation

## 2021-11-28 DIAGNOSIS — M1990 Unspecified osteoarthritis, unspecified site: Secondary | ICD-10-CM | POA: Diagnosis not present

## 2021-11-28 DIAGNOSIS — G4700 Insomnia, unspecified: Secondary | ICD-10-CM | POA: Insufficient documentation

## 2021-11-28 DIAGNOSIS — Z7984 Long term (current) use of oral hypoglycemic drugs: Secondary | ICD-10-CM | POA: Diagnosis not present

## 2021-11-28 DIAGNOSIS — E1142 Type 2 diabetes mellitus with diabetic polyneuropathy: Secondary | ICD-10-CM

## 2021-11-28 DIAGNOSIS — Z1211 Encounter for screening for malignant neoplasm of colon: Secondary | ICD-10-CM

## 2021-11-28 DIAGNOSIS — K635 Polyp of colon: Secondary | ICD-10-CM | POA: Diagnosis not present

## 2021-11-28 DIAGNOSIS — D123 Benign neoplasm of transverse colon: Secondary | ICD-10-CM | POA: Insufficient documentation

## 2021-11-28 DIAGNOSIS — D120 Benign neoplasm of cecum: Secondary | ICD-10-CM | POA: Insufficient documentation

## 2021-11-28 DIAGNOSIS — E119 Type 2 diabetes mellitus without complications: Secondary | ICD-10-CM | POA: Insufficient documentation

## 2021-11-28 HISTORY — PX: COLONOSCOPY: SHX5424

## 2021-11-28 LAB — GLUCOSE, CAPILLARY: Glucose-Capillary: 147 mg/dL — ABNORMAL HIGH (ref 70–99)

## 2021-11-28 SURGERY — COLONOSCOPY
Anesthesia: General

## 2021-11-28 MED ORDER — SODIUM CHLORIDE 0.9 % IV SOLN
INTRAVENOUS | Status: AC | PRN
Start: 2021-11-28 — End: 2021-11-28
  Administered 2021-11-28: 3 mL via INTRAMUSCULAR

## 2021-11-28 MED ORDER — GLYCOPYRROLATE 0.2 MG/ML IJ SOLN
INTRAMUSCULAR | Status: DC | PRN
Start: 2021-11-28 — End: 2021-11-28
  Administered 2021-11-28: .2 mg via INTRAVENOUS

## 2021-11-28 MED ORDER — PROPOFOL 10 MG/ML IV BOLUS
INTRAVENOUS | Status: DC | PRN
  Administered 2021-11-28: 70 mg via INTRAVENOUS

## 2021-11-28 MED ORDER — PROPOFOL 500 MG/50ML IV EMUL
INTRAVENOUS | Status: DC | PRN
  Administered 2021-11-28: 150 ug/kg/min via INTRAVENOUS

## 2021-11-28 MED ORDER — LIDOCAINE HCL (PF) 2 % IJ SOLN
INTRAMUSCULAR | Status: AC
  Filled 2021-11-28: qty 5

## 2021-11-28 MED ORDER — GLYCOPYRROLATE 0.2 MG/ML IJ SOLN
INTRAMUSCULAR | Status: AC
  Filled 2021-11-28: qty 1

## 2021-11-28 MED ORDER — EPHEDRINE SULFATE (PRESSORS) 50 MG/ML IJ SOLN
INTRAMUSCULAR | Status: DC | PRN
Start: 2021-11-28 — End: 2021-11-28
  Administered 2021-11-28 (×2): 5 mg via INTRAVENOUS

## 2021-11-28 MED ORDER — PROPOFOL 500 MG/50ML IV EMUL
INTRAVENOUS | Status: AC
  Filled 2021-11-28: qty 50

## 2021-11-28 MED ORDER — SODIUM CHLORIDE 0.9 % IV SOLN: INTRAVENOUS | Status: DC

## 2021-11-28 MED ORDER — LIDOCAINE HCL (CARDIAC) PF 100 MG/5ML IV SOSY
PREFILLED_SYRINGE | INTRAVENOUS | Status: DC | PRN
  Administered 2021-11-28: 50 mg via INTRAVENOUS

## 2021-11-28 NOTE — Transfer of Care (Signed)
Immediate Anesthesia Transfer of Care Note ? ?Patient: Valerie Ramos ? ?Procedure(s) Performed: COLONOSCOPY ? ?Patient Location: PACU ? ?Anesthesia Type:General ? ?Level of Consciousness: sedated ? ?Airway & Oxygen Therapy: Patient Spontanous Breathing ? ?Post-op Assessment: Report given to RN and Post -op Vital signs reviewed and stable ? ?Post vital signs: Reviewed and stable ? ?Last Vitals:  ?Vitals Value Taken Time  ?BP 98/64 11/28/21 0836  ?Temp    ?Pulse 78 11/28/21 0836  ?Resp 20 11/28/21 0836  ?SpO2 94 % 11/28/21 0836  ?Vitals shown include unvalidated device data. ? ?Last Pain:  ?Vitals:  ? 11/28/21 0701  ?TempSrc: Temporal  ?PainSc: 0-No pain  ?   ? ?  ? ?Complications: No notable events documented. ?

## 2021-11-28 NOTE — H&P (Signed)
?Valerie Darby, MD ?[address]  ?[address]  ?[place], [place] [address]  ?Main: [phone]  ?Fax: [phone] ?Pager: [phone] ? ?Primary Care Physician:  Lavera Guise, MD ?Primary Gastroenterologist:  Dr. Cephas Ramos ? ?Pre-Procedure History & Physical: ?HPI:  Valerie Ramos is a 66 y.o. female is here for an colonoscopy. ?  ?Past Medical History:  ?Diagnosis Date  ? Anxiety   ? Arthritis   ? Depression   ? Diabetes 1.5, managed as type 2 ([place])   ? Hypertension   ? Insomnia   ? Migraines   ? Reflux   ? Sinusitis   ? ? ?Past Surgical History:  ?Procedure Laterality Date  ? ABDOMINAL HYSTERECTOMY    ? [place]  ? COLONOSCOPY    ? CYSTECTOMY  2000  ? Ovarian  ? ESOPHAGOGASTRODUODENOSCOPY    ? OOPHORECTOMY    ? OVARIAN CYST SURGERY  2006  ? [place]  ? ? ?Prior to Admission medications   ?Medication Sig Start Date End Date Taking? Authorizing Provider  ?atenolol (TENORMIN) 25 MG tablet Take one tab po qhs for bp 07/09/21  Yes Lavera Guise, MD  ?fluticasone [facility]) 50 MCG/ACT nasal spray SPRAY 2 SPRAYS INTO EACH NOSTRIL EVERY DAY 03/28/21  Yes McDonough, Lauren K, PA-C  ?gabapentin (NEURONTIN) 100 MG capsule TAKE 1 CAPSULE BY MOUTH EVERY MORNING AND 1-3 CAPSULES AT NIGHT 10/25/21  Yes Abernathy, Yetta Flock, NP  ?glimepiride (AMARYL) 1 MG tablet Take 1 tablet (1 mg total) by mouth daily with breakfast. 09/24/21  Yes Abernathy, Alyssa, NP  ?glucose blood (ONE TOUCH ULTRA TEST) test strip FOR ONCE DAILY TESTING DX. E11.65 07/09/21  Yes Lavera Guise, MD  ?hydrochlorothiazide (HYDRODIURIL) 25 MG tablet Take 1 tablet (25 mg total) by mouth daily. 07/29/21  Yes Abernathy, Yetta Flock, NP  ?levocetirizine (XYZAL) 5 MG tablet Take 1 tablet (5 mg total) by mouth every evening. 03/28/21  Yes McDonough, Lauren K, PA-C  ?losartan (COZAAR) 100 MG tablet TAKE 1 TABLET BY MOUTH EVERY DAY 11/08/21  Yes Abernathy, Alyssa, NP  ?metFORMIN (GLUCOPHAGE) 500 MG tablet Take 500 mg by mouth 2 (two)  times daily. 08/15/21  Yes [provider]  ?oxybutynin (DITROPAN-XL) 10 MG 24 hr tablet Take 1 tablet (10 mg total) by mouth at bedtime. 07/23/21  Yes Lavera Guise, MD  ?pantoprazole (PROTONIX) 40 MG tablet TAKE 1 TABLET BY MOUTH EVERY DAY 11/08/21  Yes McDonough, Lauren K, PA-C  ?tiZANidine (ZANAFLEX) 2 MG tablet TAKE 1 TABLET (2 MG TOTAL) BY MOUTH 2 (TWO) TIMES DAILY AS NEEDED FOR MUSCLE SPASMS. 11/10/21  Yes McDonough, Lauren K, PA-C  ?zolpidem (AMBIEN) 10 MG tablet TAKE 1 TABLET BY MOUTH AT BEDTIME FOR INSOMNIA 11/27/21  Yes Abernathy, Yetta Flock, NP  ?acetaminophen (TYLENOL) 325 MG tablet Take 2 tablets (650 mg total) by mouth every 6 (six) hours as needed for mild pain (or Fever >/= 101). 05/04/17   Gouru, Aruna, MD  ?ALPRAZolam (XANAX) 0.5 MG tablet TAKE 1/2-1 TAB BY MOUTH TWICE DAILY AS NEEDED FOR ANXIETY ATTACKS (DO NOT TAKE DAILY DUE TO DEPENDENCE) 10/22/21   Jonetta Osgood, NP  ?escitalopram (LEXAPRO) 20 MG tablet TAKE 1 TABLET BY MOUTH EVERY DAY 09/25/21   Jonetta Osgood, NP  ?estradiol (ESTRACE) 0.5 MG tablet Take 1 tablet (0.5 mg total) by mouth daily. 03/21/21   Lavera Guise, MD  ?metoCLOPramide (REGLAN) 10 MG tablet Take 1 tablet (10 mg total) by mouth 3 (three) times  daily before meals. 09/24/21   Jonetta Osgood, NP  ?OneTouch Delica Lancets 01X MISC 1 each by Does not apply route daily. Use as directed once daily diag e11.65 07/09/21   Lavera Guise, MD  ?PNEUMOCOCCAL 20-VAL CONJ VACC IM Inject into the muscle.    [provider]  ?rosuvastatin (CRESTOR) 5 MG tablet TAKE 1 TABLET (5 MG TOTAL) BY MOUTH DAILY. 11/25/21   McDonough, Si Gaul, PA-C  ?SUMAtriptan (IMITREX) 50 MG tablet TAKE 1 TABLET EVERY 2 HOURS AS NEEDED FOR MIGRAINE. MAY REPEAT IN 2 HOURS IF HEADACHE PERSISTS OR RECURS. LIMIT TO 2 DOSES PER 24 HOURS 10/21/21   Jonetta Osgood, NP  ?traMADol (ULTRAM) 50 MG tablet Take 1 tablet (50 mg total) by mouth every 12 (twelve) hours as needed for moderate pain or severe pain.  09/24/21   Jonetta Osgood, NP  ? ? ?Allergies as of 10/29/2021 - Review Complete 10/29/2021  ?Allergen Reaction Noted  ? Sulfa antibiotics Hives 05/03/2017  ? Oxycodone-acetaminophen Itching 05/03/2017  ? ? ?Family History  ?Problem Relation Age of Onset  ? Hodgkin's lymphoma Mother   ? Diabetes Mother   ? Thyroid disease Mother   ? Glaucoma Mother   ? Stroke Brother   ? Heart disease Brother   ? ? ?Social History  ? ?Socioeconomic History  ? Marital status: Married  ?  Spouse name: Not on file  ? Number of children: Not on file  ? Years of education: Not on file  ? Highest education level: Not on file  ?Occupational History  ? Not on file  ?Tobacco Use  ? Smoking status: Former  ?  Types: Cigarettes  ?  Quit date: 05/24/2011  ?  Years since quitting: 10.5  ? Smokeless tobacco: Never  ?Vaping Use  ? Vaping Use: Not on file  ?Substance and Sexual Activity  ? Alcohol use: No  ? Drug use: No  ? Sexual activity: Not on file  ?Other Topics Concern  ? Not on file  ?Social History Narrative  ? Not on file  ? ?Social Determinants of Health  ? ?Financial Resource Strain: Not on file  ?Food Insecurity: Not on file  ?Transportation Needs: Not on file  ?Physical Activity: Not on file  ?Stress: Not on file  ?Social Connections: Not on file  ?Intimate Partner Violence: Not on file  ? ? ?Review of Systems: ?See HPI, otherwise negative ROS ? ?Physical Exam: ?BP 137/74   Pulse 61   Temp (!) 96.9 ?F (36.1 ?C) (Temporal)   Resp 20   Ht 5\' 8"  (1.727 m)   Wt 99.8 kg   SpO2 98%   BMI 33.45 kg/m?  ?General:   Alert,  pleasant and cooperative in NAD ?Head:  Normocephalic and atraumatic. ?Neck:  Supple; no masses or thyromegaly. ?Lungs:  Clear throughout to auscultation.    ?Heart:  Regular rate and rhythm. ?Abdomen:  Soft, nontender and nondistended. Normal bowel sounds, without guarding, and without rebound.   ?Neurologic:  Alert and  oriented x4;  grossly normal neurologically. ? ?Impression/Plan: ?Valerie Ramos is here for an  colonoscopy to be performed for colon cancer screening ? ?Risks, benefits, limitations, and alternatives regarding  colonoscopy have been reviewed with the patient.  Questions have been answered.  All parties agreeable. ? ? ?Sherri Sear, MD  11/28/2021, 7:55 AM ?

## 2021-11-28 NOTE — Anesthesia Postprocedure Evaluation (Signed)
Anesthesia Post Note ? ?Patient: Valerie Ramos ? ?Procedure(s) Performed: COLONOSCOPY ? ?Patient location during evaluation: PACU ?Anesthesia Type: General ?Level of consciousness: awake and awake and alert ?Pain management: pain level controlled ?Vital Signs Assessment: post-procedure vital signs reviewed and stable ?Respiratory status: spontaneous breathing ?Cardiovascular status: stable ?Anesthetic complications: no ? ? ?No notable events documented. ? ? ?Last Vitals:  ?Vitals:  ? 11/28/21 0835 11/28/21 0836  ?BP: 98/64 98/64  ?Pulse:  78  ?Resp:  (!) 22  ?Temp: (!) 36 ?C   ?SpO2:  93%  ?  ?Last Pain:  ?Vitals:  ? 11/28/21 0855  ?TempSrc:   ?PainSc: 0-No pain  ? ? ?  ?  ?  ?  ?  ?  ? ?VAN STAVEREN,Mardel Grudzien ? ? ? ? ?

## 2021-11-28 NOTE — Anesthesia Procedure Notes (Signed)
Date/Time: 11/28/2021 8:00 AM ?Performed by: Johnna Acosta, CRNA ?Pre-anesthesia Checklist: Patient identified, Emergency Drugs available, Suction available, Patient being monitored and Timeout performed ?Patient Re-evaluated:Patient Re-evaluated prior to induction ?Oxygen Delivery Method: Nasal cannula ?Preoxygenation: Pre-oxygenation with 100% oxygen ?Induction Type: IV induction ? ? ? ? ?

## 2021-11-28 NOTE — Anesthesia Preprocedure Evaluation (Signed)
Anesthesia Evaluation  ?Patient identified by MRN, date of birth, ID band ?Patient awake ? ? ? ?Reviewed: ?Allergy & Precautions, NPO status , Patient's Chart, lab work & pertinent test results ? ?Airway ?Mallampati: II ? ?TM Distance: >3 FB ?Neck ROM: Full ? ? ? Dental ? ?(+) Upper Dentures, Lower Dentures ?  ?Pulmonary ?neg pulmonary ROS, former smoker,  ?  ?Pulmonary exam normal ? ?+ decreased breath sounds ? ? ? ? ? Cardiovascular ?Exercise Tolerance: Good ?hypertension, Pt. on medications ?negative cardio ROS ?Normal cardiovascular exam ?Rhythm:Regular Rate:Normal ? ? ?  ?Neuro/Psych ? Headaches, Anxiety negative neurological ROS ? negative psych ROS  ? GI/Hepatic ?negative GI ROS, Neg liver ROS, GERD  ,  ?Endo/Other  ?negative endocrine ROSdiabetes, Well Controlled, Type 2, Oral Hypoglycemic Agents ? Renal/GU ?negative Renal ROS  ?negative genitourinary ?  ?Musculoskeletal ? ?(+) Arthritis ,  ? Abdominal ?Normal abdominal exam  (+)   ?Peds ?negative pediatric ROS ?(+)  Hematology ?negative hematology ROS ?(+)   ?Anesthesia Other Findings ?Past Medical History: ?No date: Anxiety ?No date: Arthritis ?No date: Depression ?No date: Diabetes 1.5, managed as type 2 ([place]) ?No date: Hypertension ?No date: Insomnia ?No date: Migraines ?No date: Reflux ?No date: Sinusitis ? ?Past Surgical History: ?No date: ABDOMINAL HYSTERECTOMY ?1989: CESAREAN SECTION ?2000: CYSTECTOMY ?    Comment:  Ovarian ?No date: OOPHORECTOMY ?2006: OVARIAN CYST SURGERY ?1980: RECTOVAGINAL FISTULA CLOSURE ? ? ? ? Reproductive/Obstetrics ?negative OB ROS ? ?  ? ? ? ? ? ? ? ? ? ? ? ? ? ?  ?  ? ? ? ? ? ? ? ? ?Anesthesia Physical ?Anesthesia Plan ? ?ASA: 3 ? ?Anesthesia Plan: General  ? ?Post-op Pain Management:   ? ?Induction: Intravenous ? ?PONV Risk Score and Plan: Propofol infusion and TIVA ? ?Airway Management Planned: Natural Airway and Nasal Cannula ? ?Additional Equipment:  ? ?Intra-op Plan:   ? ?Post-operative Plan:  ? ?Informed Consent: I have reviewed the patients History and Physical, chart, labs and discussed the procedure including the risks, benefits and alternatives for the proposed anesthesia with the patient or authorized representative who has indicated his/her understanding and acceptance.  ? ? ? ?Dental Advisory Given ? ?Plan Discussed with: CRNA and Surgeon ? ?Anesthesia Plan Comments:   ? ? ? ? ? ? ?Anesthesia Quick Evaluation ? ?

## 2021-11-28 NOTE — Op Note (Signed)
[facility] ?Gastroenterology ?Patient Name: Valerie Ramos ?Procedure Date: 11/28/2021 7:24 AM ?MRN: [id number] ?Account #: [id number] ?Date of Birth: 1956/02/13 ?Admit Type: Outpatient ?Age: 66 ?Room: [facility] ENDO ROOM 3 ?Gender: Female ?Note Status: Finalized ?Instrument Name: Colonscope [id number] ?Procedure:             Colonoscopy ?Indications:           Screening for colorectal malignant neoplasm, Last  ?                       colonoscopy: August 2012, Last colonoscopy 10 years ago ?Providers:             Lin Landsman MD, MD ?Referring MD:          Lavera Guise, MD (Referring MD) ?Medicines:             General Anesthesia ?Complications:         No immediate complications. Estimated blood loss:  ?                       Minimal. ?Procedure:             Pre-Anesthesia Assessment: ?                       - Prior to the procedure, a History and Physical was  ?                       performed, and patient medications and allergies were  ?                       reviewed. The patient is competent. The risks and  ?                       benefits of the procedure and the sedation options and  ?                       risks were discussed with the patient. All questions  ?                       were answered and informed consent was obtained.  ?                       Patient identification and proposed procedure were  ?                       verified by the physician, the nurse, the  ?                       anesthesiologist, the anesthetist and the technician  ?                       in the pre-procedure area in the procedure room in the  ?                       endoscopy suite. Mental Status Examination: alert and  ?                       oriented. Airway Examination: normal oropharyngeal  ?  airway and neck mobility. Respiratory Examination:  ?                       clear to auscultation. CV Examination: normal.  ?                       Prophylactic Antibiotics: The patient does not  require  ?                       prophylactic antibiotics. Prior Anticoagulants: The  ?                       patient has taken no previous anticoagulant or  ?                       antiplatelet agents. ASA Grade Assessment: III - A  ?                       patient with severe systemic disease. After reviewing  ?                       the risks and benefits, the patient was deemed in  ?                       satisfactory condition to undergo the procedure. The  ?                       anesthesia plan was to use general anesthesia.  ?                       Immediately prior to administration of medications,  ?                       the patient was re-assessed for adequacy to receive  ?                       sedatives. The heart rate, respiratory rate, oxygen  ?                       saturations, blood pressure, adequacy of pulmonary  ?                       ventilation, and response to care were monitored  ?                       throughout the procedure. The physical status of the  ?                       patient was re-assessed after the procedure. ?                       After obtaining informed consent, the colonoscope was  ?                       passed under direct vision. Throughout the procedure,  ?                       the patient's blood pressure, pulse, and oxygen  ?  saturations were monitored continuously. The  ?                       Colonoscope was introduced through the anus and  ?                       advanced to the the cecum, identified by appendiceal  ?                       orifice and ileocecal valve. The colonoscopy was  ?                       unusually difficult due to restricted mobility of the  ?                       colon. Successful completion of the procedure was  ?                       aided by withdrawing the scope and replacing with the  ?                       pediatric colonoscope. ?Findings: ?     Tight rectosigmoid area, switched to peds colonoscope ?      The perianal and digital rectal examinations were normal. Pertinent  ?     negatives include normal sphincter tone and no palpable rectal lesions. ?     A 9 mm polyp was found in the cecum. The polyp was flat with overlying  ?     AVM. Preparations were made for mucosal resection. Saline was injected  ?     to raise the lesion. Snare mucosal resection was performed. Resection  ?     and retrieval were complete. To prevent bleeding after mucosal  ?     resection, two hemostatic clips were successfully placed (MR  ?     conditional). There was no bleeding at the end of the procedure. ?     A 3 mm polyp was found in the cecum. The polyp was sessile. The polyp  ?     was removed with a cold snare. Resection and retrieval were complete.  ?     Estimated blood loss: none. ?     A diminutive polyp was found in the transverse colon. The polyp was  ?     sessile. The polyp was removed with a cold biopsy forceps. Resection and  ?     retrieval were complete. ?     The retroflexed view of the distal rectum and anal verge was normal and  ?     showed no anal or rectal abnormalities. ?Impression:            - One 9 mm polyp in the cecum, removed with mucosal  ?                       resection. Resected and retrieved. Clips (MR  ?                       conditional) were placed. ?                       - One 3 mm polyp in the cecum, removed with a cold  ?  snare. Resected and retrieved. ?                       - One diminutive polyp in the transverse colon,  ?                       removed with a cold biopsy forceps. Resected and  ?                       retrieved. ?                       - The distal rectum and anal verge are normal on  ?                       retroflexion view. ?                       - Mucosal resection was performed. Resection and  ?                       retrieval were complete. ?Recommendation:        - Discharge patient to home (with escort). ?                       - Resume previous  diet today. ?                       - Continue present medications. ?                       - Await pathology results. ?                       - Repeat colonoscopy in 5 to 7 years for surveillance  ?                       based on pathology results. ?Procedure Code(s):     --- Professional --- ?                       [phone], Colonoscopy, flexible; with endoscopic mucosal  ?                       resection ?                       02637, 59, Colonoscopy, flexible; with removal of  ?                       tumor(s), polyp(s), or other lesion(s) by snare  ?                       technique ?                       45380, 59, Colonoscopy, flexible; with biopsy, single  ?                       or multiple ?Diagnosis Code(s):     --- Professional --- ?  Z12.11, Encounter for screening for malignant neoplasm  ?                       of colon ?                       K63.5, Polyp of colon ?CPT copyright 2019 [employer]. All rights reserved. ?The codes documented in this report are preliminary and upon coder review may  ?be revised to meet current compliance requirements. ?Dr. Ulyess Mort ?Ran Tullis Raeanne Gathers MD, MD ?11/28/2021 8:34:37 AM ?This report has been signed electronically. ?Number of Addenda: 0 ?Note Initiated On: 11/28/2021 7:24 AM ?Scope Withdrawal Time: 0 hours 16 minutes 59 seconds  ?Total Procedure Duration: 0 hours 26 minutes 32 seconds  ?Estimated Blood Loss:  Estimated blood loss was minimal. ?     [facility] ?

## 2021-11-29 ENCOUNTER — Encounter: Payer: Self-pay | Admitting: Gastroenterology

## 2021-11-29 ENCOUNTER — Other Ambulatory Visit: Payer: Self-pay | Admitting: Nurse Practitioner

## 2021-11-29 LAB — SURGICAL PATHOLOGY

## 2021-11-29 MED ORDER — ALPRAZOLAM 0.5 MG PO TABS
ORAL_TABLET | ORAL | 1 refills | Status: DC
Start: 2021-11-29 — End: 2022-01-28

## 2021-11-29 NOTE — Telephone Encounter (Signed)
error 

## 2021-12-01 ENCOUNTER — Encounter: Payer: Self-pay | Admitting: Gastroenterology

## 2021-12-27 ENCOUNTER — Other Ambulatory Visit: Payer: Self-pay | Admitting: Internal Medicine

## 2021-12-27 ENCOUNTER — Other Ambulatory Visit: Payer: Self-pay | Admitting: Physician Assistant

## 2021-12-27 ENCOUNTER — Other Ambulatory Visit: Payer: Self-pay | Admitting: Nurse Practitioner

## 2021-12-27 DIAGNOSIS — N3281 Overactive bladder: Secondary | ICD-10-CM

## 2021-12-27 DIAGNOSIS — E1165 Type 2 diabetes mellitus with hyperglycemia: Secondary | ICD-10-CM

## 2021-12-27 DIAGNOSIS — E1142 Type 2 diabetes mellitus with diabetic polyneuropathy: Secondary | ICD-10-CM

## 2022-01-24 ENCOUNTER — Other Ambulatory Visit: Payer: Self-pay | Admitting: Nurse Practitioner

## 2022-01-27 NOTE — Telephone Encounter (Signed)
Med sent to pharmacy.

## 2022-01-28 ENCOUNTER — Encounter: Payer: Self-pay | Admitting: Nurse Practitioner

## 2022-01-28 ENCOUNTER — Telehealth: Payer: Self-pay

## 2022-01-28 ENCOUNTER — Ambulatory Visit: Payer: No Typology Code available for payment source | Admitting: Nurse Practitioner

## 2022-01-28 VITALS — BP 124/78 | HR 62 | Temp 98.5°F | Resp 16 | Ht 68.5 in | Wt 239.4 lb

## 2022-01-28 DIAGNOSIS — M546 Pain in thoracic spine: Secondary | ICD-10-CM

## 2022-01-28 DIAGNOSIS — N393 Stress incontinence (female) (male): Secondary | ICD-10-CM

## 2022-01-28 DIAGNOSIS — E1165 Type 2 diabetes mellitus with hyperglycemia: Secondary | ICD-10-CM | POA: Diagnosis not present

## 2022-01-28 DIAGNOSIS — F411 Generalized anxiety disorder: Secondary | ICD-10-CM

## 2022-01-28 DIAGNOSIS — K219 Gastro-esophageal reflux disease without esophagitis: Secondary | ICD-10-CM | POA: Diagnosis not present

## 2022-01-28 DIAGNOSIS — F5101 Primary insomnia: Secondary | ICD-10-CM

## 2022-01-28 LAB — POCT GLYCOSYLATED HEMOGLOBIN (HGB A1C): Hemoglobin A1C: 7.6 % — AB (ref 4.0–5.6)

## 2022-01-28 MED ORDER — ESTRADIOL 0.5 MG PO TABS: 0.5000 mg | ORAL_TABLET | Freq: Every day | ORAL | 3 refills | Status: DC

## 2022-01-28 MED ORDER — TIZANIDINE HCL 2 MG PO TABS: 2.0000 mg | ORAL_TABLET | Freq: Two times a day (BID) | ORAL | 3 refills | Status: DC | PRN

## 2022-01-28 MED ORDER — METOCLOPRAMIDE HCL 10 MG PO TABS: 10.0000 mg | ORAL_TABLET | Freq: Three times a day (TID) | ORAL | 3 refills | Status: DC

## 2022-01-28 MED ORDER — ALPRAZOLAM 0.5 MG PO TABS: ORAL_TABLET | ORAL | 2 refills | Status: DC

## 2022-01-28 MED ORDER — ZOLPIDEM TARTRATE 10 MG PO TABS: 10.0000 mg | ORAL_TABLET | Freq: Every day | ORAL | 2 refills | Status: DC

## 2022-01-28 MED ORDER — METFORMIN HCL 500 MG PO TABS: 500.0000 mg | ORAL_TABLET | Freq: Two times a day (BID) | ORAL | 3 refills | Status: DC

## 2022-01-28 MED ORDER — ESCITALOPRAM OXALATE 20 MG PO TABS: 20.0000 mg | ORAL_TABLET | Freq: Every day | ORAL | 3 refills | Status: DC

## 2022-01-28 NOTE — Progress Notes (Cosign Needed)
[facility] [place], [place] [address]  Internal MEDICINE  Office Visit Note  Patient Name: Valerie Ramos  [id number]  [id number]  Date of Service: 01/28/2022  Chief Complaint  Patient presents with   Follow-up    Right side on neck under jaw (possibly lymp node) was swollen, its still tender, pt had ibuprofen and extra antibiotics at home and took some and the swelling has gone down    Diabetes   Depression   Anxiety   Hypertension    HPI Shadee presents for a follow-up visit for diabetes, hypertension, anxiety and medication refills.  Her A1c was checked today and was 7.6 which is a significant elevation compared to 7.0 in January.  She reports that her glucose levels have been a range of approximately 118-150 depending on the time of day when she checks it.  Her medications have not changed.  She has gained a few pounds since her previous office visit.  Patient's diet was reviewed in detail during today's office visit and she reports that she wakes up in the late morning most days and typically has cereal for a late breakfast.  She reports that she usually skips lunch.  For dinner, she reports that her meals vary and she does eat salmon and green beans and other vegetables but she also often eats potatoes and pasta and other starches that could be shooting her glucose levels up.  Patient reports she has also been drinking a lot of 2% milk Right side of neck swollen lymph node x4 days. Some slight signs of allergic rhinitis or sinus problems.    Current Medication: Outpatient Encounter Medications as of 01/28/2022  Medication Sig   acetaminophen (TYLENOL) 325 MG tablet Take 2 tablets (650 mg total) by mouth every 6 (six) hours as needed for mild pain (or Fever >/= 101).   atenolol (TENORMIN) 25 MG tablet Take one tab po qhs for bp   fluticasone (FLONASE) 50 MCG/ACT nasal spray SPRAY 2 SPRAYS INTO EACH NOSTRIL EVERY DAY   gabapentin (NEURONTIN) 100 MG capsule TAKE 1  CAPSULE BY MOUTH EVERY MORNING AND 1-3 CAPSULES AT NIGHT   glimepiride (AMARYL) 1 MG tablet TAKE 1 TABLET BY MOUTH DAILY WITH BREAKFAST.   glucose blood (ONE TOUCH ULTRA TEST) test strip FOR ONCE DAILY TESTING DX. E11.65   hydrochlorothiazide (HYDRODIURIL) 25 MG tablet Take 1 tablet (25 mg total) by mouth daily.   losartan (COZAAR) 100 MG tablet TAKE 1 TABLET BY MOUTH EVERY DAY   OneTouch Delica Lancets 67E MISC 1 each by Does not apply route daily. Use as directed once daily diag e11.65   oxybutynin (DITROPAN-XL) 10 MG 24 hr tablet TAKE 1 TABLET BY MOUTH EVERYDAY AT BEDTIME   pantoprazole (PROTONIX) 40 MG tablet TAKE 1 TABLET BY MOUTH EVERY DAY   PNEUMOCOCCAL 20-VAL CONJ VACC IM Inject into the muscle.   rosuvastatin (CRESTOR) 5 MG tablet TAKE 1 TABLET (5 MG TOTAL) BY MOUTH DAILY.   SUMAtriptan (IMITREX) 50 MG tablet TAKE 1 TABLET EVERY 2 HOURS AS NEEDED FOR MIGRAINE. MAY REPEAT IN 2 HOURS IF HEADACHE PERSISTS OR RECURS. LIMIT TO 2 DOSES PER 24 HOURS   traMADol (ULTRAM) 50 MG tablet Take 1 tablet (50 mg total) by mouth every 12 (twelve) hours as needed for moderate pain or severe pain.   [DISCONTINUED] ALPRAZolam (XANAX) 0.5 MG tablet TAKE 1/2-1 TAB BY MOUTH TWICE DAILY AS NEEDED FOR ANXIETY ATTACKS (DO NOT TAKE DAILY DUE TO DEPENDENCE)   [  DISCONTINUED] escitalopram (LEXAPRO) 20 MG tablet TAKE 1 TABLET BY MOUTH EVERY DAY   [DISCONTINUED] estradiol (ESTRACE) 0.5 MG tablet Take 1 tablet (0.5 mg total) by mouth daily.   [DISCONTINUED] levocetirizine (XYZAL) 5 MG tablet Take 1 tablet (5 mg total) by mouth every evening.   [DISCONTINUED] metFORMIN (GLUCOPHAGE) 500 MG tablet Take 500 mg by mouth 2 (two) times daily.   [DISCONTINUED] metoCLOPramide (REGLAN) 10 MG tablet Take 1 tablet (10 mg total) by mouth 3 (three) times daily before meals.   [DISCONTINUED] tiZANidine (ZANAFLEX) 2 MG tablet TAKE 1 TABLET (2 MG TOTAL) BY MOUTH 2 (TWO) TIMES DAILY AS NEEDED FOR MUSCLE SPASMS.   [DISCONTINUED] zolpidem  (AMBIEN) 10 MG tablet TAKE 1 TABLET BY MOUTH AT BEDTIME FOR INSOMNIA   ALPRAZolam (XANAX) 0.5 MG tablet TAKE 1/2-1 TAB BY MOUTH TWICE DAILY AS NEEDED FOR ANXIETY ATTACKS (DO NOT TAKE DAILY DUE TO DEPENDENCE)   escitalopram (LEXAPRO) 20 MG tablet Take 1 tablet (20 mg total) by mouth daily.   estradiol (ESTRACE) 0.5 MG tablet Take 1 tablet (0.5 mg total) by mouth daily.   metFORMIN (GLUCOPHAGE) 500 MG tablet Take 1 tablet (500 mg total) by mouth 2 (two) times daily with a meal.   metoCLOPramide (REGLAN) 10 MG tablet Take 1 tablet (10 mg total) by mouth 3 (three) times daily before meals.   tiZANidine (ZANAFLEX) 2 MG tablet Take 1 tablet (2 mg total) by mouth 2 (two) times daily as needed for muscle spasms.   zolpidem (AMBIEN) 10 MG tablet Take 1 tablet (10 mg total) by mouth at bedtime.   No facility-administered encounter medications on file as of 01/28/2022.    Surgical History: Past Surgical History:  Procedure Laterality Date   ABDOMINAL HYSTERECTOMY     CESAREAN SECTION  1989   COLONOSCOPY     COLONOSCOPY N/A 11/28/2021   Procedure: COLONOSCOPY;  Surgeon: Lin Landsman, MD;  Location[facility] ENDOSCOPY;  Service: Gastroenterology;  Laterality: N/A;   CYSTECTOMY  2000   Ovarian   ESOPHAGOGASTRODUODENOSCOPY     OOPHORECTOMY     OVARIAN CYST SURGERY  2006   RECTOVAGINAL FISTULA CLOSURE  1980    Medical History: Past Medical History:  Diagnosis Date   Anxiety    Arthritis    Depression    Diabetes 1.5, managed as type 2 ([place])    Hypertension    Insomnia    Migraines    Reflux    Sinusitis     Family History: Family History  Problem Relation Age of Onset   Hodgkin's lymphoma Mother    Diabetes Mother    Thyroid disease Mother    Glaucoma Mother    Stroke Brother    Heart disease Brother     Social History   Socioeconomic History   Marital status: Married    Spouse name: Not on file   Number of children: Not on file   Years of education: Not on file   Highest  education level: Not on file  Occupational History   Not on file  Tobacco Use   Smoking status: Former    Types: Cigarettes    Quit date: 05/24/2011    Years since quitting: 10.6   Smokeless tobacco: Never  Vaping Use   Vaping Use: Not on file  Substance and Sexual Activity   Alcohol use: No   Drug use: No   Sexual activity: Not on file  Other Topics Concern   Not on file  Social History Narrative  Not on file   Social Determinants of Health   Financial Resource Strain: Not on file  Food Insecurity: Not on file  Transportation Needs: Not on file  Physical Activity: Not on file  Stress: Not on file  Social Connections: Not on file  Intimate Partner Violence: Not on file      Review of Systems  Constitutional:  Negative for chills, fatigue and unexpected weight change.  HENT:  Positive for congestion, postnasal drip and rhinorrhea. Negative for sneezing and sore throat.   Eyes:  Negative for redness.  Respiratory: Negative.  Negative for cough, chest tightness, shortness of breath and wheezing.   Cardiovascular: Negative.  Negative for chest pain and palpitations.  Gastrointestinal:  Negative for abdominal pain, constipation, diarrhea, nausea and vomiting.  Genitourinary:  Negative for dysuria and frequency.  Musculoskeletal:  Negative for arthralgias, back pain, joint swelling and neck pain.  Skin:  Negative for rash.  Neurological: Negative.  Negative for tremors and numbness.  Hematological:  Negative for adenopathy. Does not bruise/bleed easily.  Psychiatric/Behavioral:  Negative for behavioral problems (Depression), sleep disturbance and suicidal ideas. The patient is not nervous/anxious.     Vital Signs: BP 124/78   Pulse 62   Temp 98.5 F (36.9 C)   Resp 16   Ht 5' 8.5" (1.74 m)   Wt 239 lb 6.4 oz (108.6 kg)   SpO2 97%   BMI 35.87 kg/m    Physical Exam Vitals reviewed.  Constitutional:      General: She is not in acute distress.    Appearance:  Normal appearance. She is obese. She is not ill-appearing.  HENT:     Head: Normocephalic and atraumatic.     Nose: Congestion and rhinorrhea present.  Eyes:     Pupils: Pupils are equal, round, and reactive to light.  Cardiovascular:     Rate and Rhythm: Normal rate and regular rhythm.  Pulmonary:     Effort: Pulmonary effort is normal. No respiratory distress.  Neurological:     Mental Status: She is alert and oriented to person, place, and time.  Psychiatric:        Mood and Affect: Mood normal.        Behavior: Behavior normal.        Assessment/Plan: 1. Type 2 diabetes mellitus with hyperglycemia, without long-term current use of insulin (HCC) A1c significantly elevated, continue metformin as prescribed, refills ordered.  Discussed diet modifications and lifestyle modifications with patient and she will continue to work on those and A1c will be repeated in 3 months. - POCT HgB A1C - metFORMIN (GLUCOPHAGE) 500 MG tablet; Take 1 tablet (500 mg total) by mouth 2 (two) times daily with a meal.  Dispense: 180 tablet; Refill: 3  2. GERD without esophagitis Continue metoclopramide as ordered, refills ordered. - metoCLOPramide (REGLAN) 10 MG tablet; Take 1 tablet (10 mg total) by mouth 3 (three) times daily before meals.  Dispense: 270 tablet; Refill: 3  3. Dorsalgia of thoracic region Stable, refills ordered - tiZANidine (ZANAFLEX) 2 MG tablet; Take 1 tablet (2 mg total) by mouth 2 (two) times daily as needed for muscle spasms.  Dispense: 180 tablet; Refill: 3  4. Stress incontinence (female) (female) Stable, refills of estradiol ordered - estradiol (ESTRACE) 0.5 MG tablet; Take 1 tablet (0.5 mg total) by mouth daily.  Dispense: 90 tablet; Refill: 3  5. Generalized anxiety disorder Continues to take Lexapro daily to control anxiety symptoms, alprazolam as needed for increased anxiety.  Refills ordered,  follow-up in 3 months for additional refills of alprazolam - ALPRAZolam  (XANAX) 0.5 MG tablet; TAKE 1/2-1 TAB BY MOUTH TWICE DAILY AS NEEDED FOR ANXIETY ATTACKS (DO NOT TAKE DAILY DUE TO DEPENDENCE)  Dispense: 45 tablet; Refill: 2 - escitalopram (LEXAPRO) 20 MG tablet; Take 1 tablet (20 mg total) by mouth daily.  Dispense: 90 tablet; Refill: 3  6. Primary insomnia Ambien remains effective, refills ordered x3 months, follow-up in 3 months for additional refills - zolpidem (AMBIEN) 10 MG tablet; Take 1 tablet (10 mg total) by mouth at bedtime.  Dispense: 30 tablet; Refill: 2   General Counseling: Lura verbalizes understanding of the findings of todays visit and agrees with plan of treatment. I have discussed any further diagnostic evaluation that may be needed or ordered today. We also reviewed her medications today. she has been encouraged to call the office with any questions or concerns that should arise related to todays visit.    Orders Placed This Encounter  Procedures   POCT HgB A1C    Meds ordered this encounter  Medications   ALPRAZolam (XANAX) 0.5 MG tablet    Sig: TAKE 1/2-1 TAB BY MOUTH TWICE DAILY AS NEEDED FOR ANXIETY ATTACKS (DO NOT TAKE DAILY DUE TO DEPENDENCE)    Dispense:  45 tablet    Refill:  2    For future refills.   escitalopram (LEXAPRO) 20 MG tablet    Sig: Take 1 tablet (20 mg total) by mouth daily.    Dispense:  90 tablet    Refill:  3    For future refills   estradiol (ESTRACE) 0.5 MG tablet    Sig: Take 1 tablet (0.5 mg total) by mouth daily.    Dispense:  90 tablet    Refill:  3    For future refills   zolpidem (AMBIEN) 10 MG tablet    Sig: Take 1 tablet (10 mg total) by mouth at bedtime.    Dispense:  30 tablet    Refill:  2    For future refills, Not to exceed 2 additional fills before 05/26/2022   metoCLOPramide (REGLAN) 10 MG tablet    Sig: Take 1 tablet (10 mg total) by mouth 3 (three) times daily before meals.    Dispense:  270 tablet    Refill:  3   tiZANidine (ZANAFLEX) 2 MG tablet    Sig: Take 1 tablet (2  mg total) by mouth 2 (two) times daily as needed for muscle spasms.    Dispense:  180 tablet    Refill:  3   metFORMIN (GLUCOPHAGE) 500 MG tablet    Sig: Take 1 tablet (500 mg total) by mouth 2 (two) times daily with a meal.    Dispense:  180 tablet    Refill:  3    Return in about 3 months (around 04/30/2022) for F/U, Recheck A1C, Theresia Pree PCP.   Total time spent:30 Minutes Time spent includes review of chart, medications, test results, and follow up plan with the patient.   [place] Controlled Substance Database was reviewed by me.  This patient was seen by Jonetta Osgood, FNP-C in collaboration with Dr. Clayborn Bigness as a part of collaborative care agreement.   Jiah Bari R. Valetta Fuller, MSN, FNP-C Internal medicine

## 2022-01-28 NOTE — Telephone Encounter (Signed)
Mailed today's after care summary to patient-Valerie Ramos ?

## 2022-01-28 NOTE — Telephone Encounter (Signed)
Attempted to contact patient to schedule f/u. No answer. Vm not set up-[name] ?

## 2022-02-04 ENCOUNTER — Telehealth: Payer: Self-pay

## 2022-02-05 ENCOUNTER — Other Ambulatory Visit: Payer: Self-pay | Admitting: Nurse Practitioner

## 2022-02-05 DIAGNOSIS — J020 Streptococcal pharyngitis: Secondary | ICD-10-CM

## 2022-02-05 MED ORDER — AMOXICILLIN 500 MG PO CAPS: 500.0000 mg | ORAL_CAPSULE | Freq: Two times a day (BID) | ORAL | 0 refills | Status: AC

## 2022-02-05 NOTE — Telephone Encounter (Signed)
Pt advised that we send antibiotic  ?

## 2022-02-16 ENCOUNTER — Other Ambulatory Visit: Payer: Self-pay | Admitting: Nurse Practitioner

## 2022-02-16 DIAGNOSIS — E1165 Type 2 diabetes mellitus with hyperglycemia: Secondary | ICD-10-CM

## 2022-03-15 ENCOUNTER — Encounter: Payer: Self-pay | Admitting: Nurse Practitioner

## 2022-04-02 ENCOUNTER — Other Ambulatory Visit: Payer: Self-pay | Admitting: Nurse Practitioner

## 2022-04-02 DIAGNOSIS — I10 Essential (primary) hypertension: Secondary | ICD-10-CM

## 2022-04-30 ENCOUNTER — Ambulatory Visit: Payer: No Typology Code available for payment source | Admitting: Nurse Practitioner

## 2022-05-15 ENCOUNTER — Ambulatory Visit (INDEPENDENT_AMBULATORY_CARE_PROVIDER_SITE_OTHER): Payer: No Typology Code available for payment source | Admitting: Nurse Practitioner

## 2022-05-15 ENCOUNTER — Other Ambulatory Visit: Payer: Self-pay | Admitting: Internal Medicine

## 2022-05-15 ENCOUNTER — Other Ambulatory Visit: Payer: Self-pay | Admitting: Physician Assistant

## 2022-05-15 ENCOUNTER — Encounter: Payer: Self-pay | Admitting: Nurse Practitioner

## 2022-05-15 ENCOUNTER — Other Ambulatory Visit: Payer: Self-pay | Admitting: Nurse Practitioner

## 2022-05-15 VITALS — BP 130/61 | HR 78 | Temp 97.8°F | Resp 16 | Ht 68.5 in | Wt 246.0 lb

## 2022-05-15 DIAGNOSIS — I10 Essential (primary) hypertension: Secondary | ICD-10-CM

## 2022-05-15 DIAGNOSIS — F411 Generalized anxiety disorder: Secondary | ICD-10-CM

## 2022-05-15 DIAGNOSIS — E1165 Type 2 diabetes mellitus with hyperglycemia: Secondary | ICD-10-CM

## 2022-05-15 DIAGNOSIS — Z636 Dependent relative needing care at home: Secondary | ICD-10-CM

## 2022-05-15 DIAGNOSIS — K219 Gastro-esophageal reflux disease without esophagitis: Secondary | ICD-10-CM

## 2022-05-15 DIAGNOSIS — Z0001 Encounter for general adult medical examination with abnormal findings: Secondary | ICD-10-CM | POA: Diagnosis not present

## 2022-05-15 DIAGNOSIS — N3281 Overactive bladder: Secondary | ICD-10-CM

## 2022-05-15 DIAGNOSIS — R300 Dysuria: Secondary | ICD-10-CM

## 2022-05-15 DIAGNOSIS — F331 Major depressive disorder, recurrent, moderate: Secondary | ICD-10-CM

## 2022-05-15 DIAGNOSIS — G43009 Migraine without aura, not intractable, without status migrainosus: Secondary | ICD-10-CM

## 2022-05-15 LAB — POCT GLYCOSYLATED HEMOGLOBIN (HGB A1C): Hemoglobin A1C: 8.4 % — AB (ref 4.0–5.6)

## 2022-05-15 MED ORDER — VENLAFAXINE HCL ER 37.5 MG PO CP24: 37.5000 mg | ORAL_CAPSULE | Freq: Every day | ORAL | 2 refills | Status: DC

## 2022-05-15 MED ORDER — ALPRAZOLAM 0.5 MG PO TABS: ORAL_TABLET | ORAL | 0 refills | Status: DC

## 2022-05-15 MED ORDER — GLIMEPIRIDE 1 MG PO TABS: ORAL_TABLET | ORAL | 1 refills | Status: DC

## 2022-05-15 NOTE — Progress Notes (Signed)
[facility] [place], [place] [address]  Internal MEDICINE  Office Visit Note  Patient Name: Valerie Ramos  [id number]  [id number]  Date of Service: 05/15/2022  Chief Complaint  Patient presents with   Annual Exam   Depression   Diabetes   Hypertension    HPI [name] presents for an annual well visit and physical exam.  Well-appearing 66 year old female with hypertension, diabetes, GERD, osteoarthritis and atherosclerosis Mammo done 11/12/21 Colonscopy due in 2028 Pap due 2025 Had some labs done with work, will bring to office to scan into chart --having increased depression and anxiety, increased caregiver stress. Caring for husband with worsening progression of parkinsons and her son has multiple sclerosis and has been relapsing. Feels like husband is depressed but is not sure how best to help him. Not coping well with increased stress, depression and anxiety.  --a1c today increased to 8.4 --blood pressure stable, other vital signs ok     Current Medication: Outpatient Encounter Medications as of 05/15/2022  Medication Sig   acetaminophen (TYLENOL) 325 MG tablet Take 2 tablets (650 mg total) by mouth every 6 (six) hours as needed for mild pain (or Fever >/= 101).   atenolol (TENORMIN) 25 MG tablet TAKE 1 TABLET BY MOUTH AT BEDTIME FOR BLOOD PRESSURE   estradiol (ESTRACE) 0.5 MG tablet Take 1 tablet (0.5 mg total) by mouth daily.   fluticasone (FLONASE) 50 MCG/ACT nasal spray SPRAY 2 SPRAYS INTO EACH NOSTRIL EVERY DAY   gabapentin (NEURONTIN) 100 MG capsule TAKE 1 CAPSULE BY MOUTH EVERY MORNING AND 1-3 CAPSULES AT NIGHT   glucose blood (ONE TOUCH ULTRA TEST) test strip FOR ONCE DAILY TESTING DX. E11.65   hydrochlorothiazide (HYDRODIURIL) 25 MG tablet TAKE 1 TABLET (25 MG TOTAL) BY MOUTH DAILY.   metFORMIN (GLUCOPHAGE) 500 MG tablet Take 1 tablet (500 mg total) by mouth 2 (two) times daily with a meal.   metoCLOPramide (REGLAN) 10 MG tablet Take 1 tablet  (10 mg total) by mouth 3 (three) times daily before meals.   oxybutynin (DITROPAN-XL) 10 MG 24 hr tablet TAKE 1 TABLET BY MOUTH EVERYDAY AT BEDTIME   pantoprazole (PROTONIX) 40 MG tablet TAKE 1 TABLET BY MOUTH EVERY DAY   PNEUMOCOCCAL 20-VAL CONJ VACC IM Inject into the muscle.   rosuvastatin (CRESTOR) 5 MG tablet TAKE 1 TABLET (5 MG TOTAL) BY MOUTH DAILY.   SUMAtriptan (IMITREX) 50 MG tablet TAKE 1 TABLET EVERY 2 HOURS AS NEEDED FOR MIGRAINE. MAY REPEAT IN 2 HOURS IF HEADACHE PERSISTS OR RECURS. LIMIT TO 2 DOSES PER 24 HOURS   tiZANidine (ZANAFLEX) 2 MG tablet Take 1 tablet (2 mg total) by mouth 2 (two) times daily as needed for muscle spasms.   traMADol (ULTRAM) 50 MG tablet Take 1 tablet (50 mg total) by mouth every 12 (twelve) hours as needed for moderate pain or severe pain.   [DISCONTINUED] ALPRAZolam (XANAX) 0.5 MG tablet TAKE 1/2-1 TAB BY MOUTH TWICE DAILY AS NEEDED FOR ANXIETY ATTACKS (DO NOT TAKE DAILY DUE TO DEPENDENCE)   [DISCONTINUED] escitalopram (LEXAPRO) 20 MG tablet Take 1 tablet (20 mg total) by mouth daily.   [DISCONTINUED] glimepiride (AMARYL) 1 MG tablet TAKE 1 TABLET BY MOUTH EVERY DAY WITH BREAKFAST   [DISCONTINUED] losartan (COZAAR) 100 MG tablet TAKE 1 TABLET BY MOUTH EVERY DAY   [DISCONTINUED] OneTouch Delica Lancets 07P MISC 1 each by Does not apply route daily. Use as directed once daily diag e11.65   [DISCONTINUED] venlafaxine XR (EFFEXOR-XR)  37.5 MG 24 hr capsule Take 1 capsule (37.5 mg total) by mouth daily with breakfast.   [DISCONTINUED] zolpidem (AMBIEN) 10 MG tablet Take 1 tablet (10 mg total) by mouth at bedtime.   [DISCONTINUED] ALPRAZolam (XANAX) 0.5 MG tablet TAKE 1 TAB BY MOUTH TWICE DAILY AS NEEDED FOR ANXIETY ATTACKS (DO NOT TAKE DAILY DUE TO DEPENDENCE)   [DISCONTINUED] glimepiride (AMARYL) 1 MG tablet Take 1 tablet by mouth daily with breakfast and dinner.   No facility-administered encounter medications on file as of 05/15/2022.    Surgical  History: Past Surgical History:  Procedure Laterality Date   ABDOMINAL HYSTERECTOMY     CESAREAN SECTION  1989   COLONOSCOPY     COLONOSCOPY N/A 11/28/2021   Procedure: COLONOSCOPY;  Surgeon: Lin Landsman, MD;  Location: [facility] ENDOSCOPY;  Service: Gastroenterology;  Laterality: N/A;   CYSTECTOMY  2000   Ovarian   ESOPHAGOGASTRODUODENOSCOPY     OOPHORECTOMY     OVARIAN CYST SURGERY  2006   RECTOVAGINAL FISTULA CLOSURE  1980    Medical History: Past Medical History:  Diagnosis Date   Anxiety    Arthritis    Depression    Diabetes 1.5, managed as type 2 ([place])    Hypertension    Insomnia    Migraines    Reflux    Sinusitis     Family History: Family History  Problem Relation Age of Onset   Hodgkin's lymphoma Mother    Diabetes Mother    Thyroid disease Mother    Glaucoma Mother    Stroke Brother    Heart disease Brother     Social History   Socioeconomic History   Marital status: Married    Spouse name: Not on file   Number of children: Not on file   Years of education: Not on file   Highest education level: Not on file  Occupational History   Not on file  Tobacco Use   Smoking status: Former    Types: Cigarettes    Quit date: 05/24/2011    Years since quitting: 11.1   Smokeless tobacco: Never  Vaping Use   Vaping Use: Not on file  Substance and Sexual Activity   Alcohol use: No   Drug use: No   Sexual activity: Not on file  Other Topics Concern   Not on file  Social History Narrative   Not on file   Social Determinants of Health   Financial Resource Strain: Not on file  Food Insecurity: Not on file  Transportation Needs: Not on file  Physical Activity: Not on file  Stress: Not on file  Social Connections: Not on file  Intimate Partner Violence: Not on file      Review of Systems  Constitutional:  Negative for chills, fatigue and unexpected weight change.  HENT:  Negative for congestion, postnasal drip, rhinorrhea, sneezing and sore  throat.   Eyes:  Negative for redness.  Respiratory:  Negative for cough, chest tightness and shortness of breath.   Cardiovascular:  Negative for chest pain and palpitations.  Gastrointestinal:  Negative for abdominal pain, constipation, diarrhea, nausea and vomiting.  Genitourinary:  Negative for dysuria and frequency.  Musculoskeletal:  Negative for arthralgias, back pain, joint swelling and neck pain.  Skin:  Negative for rash.  Neurological: Negative.  Negative for tremors and numbness.  Hematological:  Negative for adenopathy. Does not bruise/bleed easily.  Psychiatric/Behavioral:  Positive for behavioral problems (Depression), decreased concentration, dysphoric mood and sleep disturbance. Negative for self-injury and  suicidal ideas. The patient is nervous/anxious.        Anhedonia, fatigue, overeating d/t stress, feelings of hopelessness and powerlessness    Vital Signs: BP 130/61   Pulse 78   Temp 97.8 F (36.6 C)   Resp 16   Ht 5' 8.5" (1.74 m)   Wt 246 lb (111.6 kg)   SpO2 97%   BMI 36.86 kg/m    Physical Exam Vitals reviewed.  Constitutional:      General: She is not in acute distress.    Appearance: Normal appearance. She is well-developed. She is obese. She is not ill-appearing or diaphoretic.  HENT:     Head: Normocephalic and atraumatic.     Right Ear: Tympanic membrane, ear canal and external ear normal. There is no impacted cerumen.     Left Ear: Tympanic membrane, ear canal and external ear normal. There is no impacted cerumen.     Nose: Nose normal. No congestion or rhinorrhea.     Mouth/Throat:     Mouth: Mucous membranes are moist.     Pharynx: Oropharynx is clear. No oropharyngeal exudate or posterior oropharyngeal erythema.  Eyes:     General: No scleral icterus.       Right eye: No discharge.        Left eye: No discharge.     Extraocular Movements: Extraocular movements intact.     Conjunctiva/sclera: Conjunctivae normal.     Pupils: Pupils are  equal, round, and reactive to light.  Neck:     Thyroid: No thyromegaly.     Vascular: No JVD.     Trachea: No tracheal deviation.  Cardiovascular:     Rate and Rhythm: Normal rate and regular rhythm.     Pulses: Normal pulses.     Heart sounds: Normal heart sounds. No murmur heard.    No friction rub. No gallop.  Pulmonary:     Effort: Pulmonary effort is normal. No respiratory distress.     Breath sounds: Normal breath sounds. No stridor. No wheezing or rales.  Chest:     Chest wall: No tenderness.  Breasts:    Right: Normal.     Left: Normal.  Abdominal:     General: Bowel sounds are normal. There is no distension.     Palpations: Abdomen is soft. There is no mass.     Tenderness: There is no abdominal tenderness. There is no guarding or rebound.  Musculoskeletal:        General: No tenderness or deformity. Normal range of motion.     Cervical back: Normal range of motion and neck supple.  Lymphadenopathy:     Cervical: No cervical adenopathy.  Skin:    General: Skin is warm and dry.     Coloration: Skin is not pale.     Findings: No erythema or rash.  Neurological:     General: No focal deficit present.     Mental Status: She is alert.     Cranial Nerves: No cranial nerve deficit.     Motor: No abnormal muscle tone.     Coordination: Coordination normal.     Deep Tendon Reflexes: Reflexes are normal and symmetric.  Psychiatric:        Mood and Affect: Mood is anxious and depressed. Affect is tearful.        Speech: Speech normal.        Behavior: Behavior normal. Behavior is cooperative.        Thought Content: Thought content normal. Thought  content is not paranoid or delusional. Thought content does not include homicidal or suicidal ideation.        Judgment: Judgment normal.        Assessment/Plan: 1. Encounter for general adult medical examination with abnormal findings Age-appropriate preventive screenings and vaccinations discussed, annual physical exam  completed. Routine labs deferred for now, will discuss at later visit. PHM updated.   2. Type 2 diabetes mellitus with hyperglycemia, without long-term current use of insulin (HCC) A1c elevated further, overeating and stress eating due to depression and high stress level - POCT HgB A1C  3. Dysuria Routine urinalysis done  - UA/M w/rflx Culture, Routine - Microscopic Examination  4. Generalized anxiety disorder Start venlafaxine 37.5 mg daily xr. Follow up in 4 weeks   5. Moderate episode of recurrent major depressive disorder (HCC) Start venlafaxine 37.5 mg daily xr, follow up in 4 weeks  6. Caregiver stress Increased caregiver stress, discussed taking care of herself so she can better take care of her family as well as taking some time out occasionally or weekly to do something she enjoys that can decrease her stress.       General Counseling: amandalynn pitz understanding of the findings of todays visit and agrees with plan of treatment. I have discussed any further diagnostic evaluation that may be needed or ordered today. We also reviewed her medications today. she has been encouraged to call the office with any questions or concerns that should arise related to todays visit.    Orders Placed This Encounter  Procedures   Microscopic Examination   UA/M w/rflx Culture, Routine   POCT HgB A1C    Meds ordered this encounter  Medications   DISCONTD: venlafaxine XR (EFFEXOR-XR) 37.5 MG 24 hr capsule    Sig: Take 1 capsule (37.5 mg total) by mouth daily with breakfast.    Dispense:  30 capsule    Refill:  2   DISCONTD: ALPRAZolam (XANAX) 0.5 MG tablet    Sig: TAKE 1 TAB BY MOUTH TWICE DAILY AS NEEDED FOR ANXIETY ATTACKS (DO NOT TAKE DAILY DUE TO DEPENDENCE)    Dispense:  60 tablet    Refill:  0    Note change in dose, please fill new prescription now   DISCONTD: glimepiride (AMARYL) 1 MG tablet    Sig: Take 1 tablet by mouth daily with breakfast and dinner.    Dispense:   180 tablet    Refill:  1    Return in about 4 weeks (around 06/12/2022) for F/U, eval new med, Anxiety/depression, Sheryl Towell PCP.   Total time spent:30 Minutes Time spent includes review of chart, medications, test results, and follow up plan with the patient.   [place] Controlled Substance Database was reviewed by me.  This patient was seen by Jonetta Osgood, FNP-C in collaboration with Dr. Clayborn Bigness as a part of collaborative care agreement.  Fahad Cisse R. Valetta Fuller, MSN, FNP-C Internal medicine

## 2022-05-16 LAB — UA/M W/RFLX CULTURE, ROUTINE
Bilirubin, UA: NEGATIVE
Ketones, UA: NEGATIVE
Leukocytes,UA: NEGATIVE
Nitrite, UA: NEGATIVE
Protein,UA: NEGATIVE
RBC, UA: NEGATIVE
Specific Gravity, UA: 1.017 (ref 1.005–1.030)
Urobilinogen, Ur: 1 mg/dL (ref 0.2–1.0)
pH, UA: 5.5 (ref 5.0–7.5)

## 2022-05-16 LAB — MICROSCOPIC EXAMINATION
Bacteria, UA: NONE SEEN
Casts: NONE SEEN /lpf
WBC, UA: NONE SEEN /hpf (ref 0–5)

## 2022-06-09 ENCOUNTER — Other Ambulatory Visit: Payer: Self-pay | Admitting: Nurse Practitioner

## 2022-06-12 ENCOUNTER — Ambulatory Visit: Payer: No Typology Code available for payment source | Admitting: Nurse Practitioner

## 2022-06-12 ENCOUNTER — Encounter: Payer: Self-pay | Admitting: Nurse Practitioner

## 2022-06-12 VITALS — BP 132/63 | HR 72 | Temp 96.0°F | Resp 16 | Ht 68.5 in | Wt 245.0 lb

## 2022-06-12 DIAGNOSIS — E1165 Type 2 diabetes mellitus with hyperglycemia: Secondary | ICD-10-CM

## 2022-06-12 DIAGNOSIS — F411 Generalized anxiety disorder: Secondary | ICD-10-CM

## 2022-06-12 DIAGNOSIS — F5101 Primary insomnia: Secondary | ICD-10-CM | POA: Diagnosis not present

## 2022-06-12 DIAGNOSIS — I10 Essential (primary) hypertension: Secondary | ICD-10-CM

## 2022-06-12 MED ORDER — ALPRAZOLAM 0.5 MG PO TABS: ORAL_TABLET | ORAL | 0 refills | Status: DC

## 2022-06-12 MED ORDER — LOSARTAN POTASSIUM 100 MG PO TABS: 100.0000 mg | ORAL_TABLET | Freq: Every day | ORAL | 1 refills | Status: DC

## 2022-06-12 MED ORDER — ZOLPIDEM TARTRATE 10 MG PO TABS: 10.0000 mg | ORAL_TABLET | Freq: Every day | ORAL | 2 refills | Status: DC

## 2022-06-12 MED ORDER — GLIMEPIRIDE 1 MG PO TABS: ORAL_TABLET | ORAL | 1 refills | Status: DC

## 2022-06-12 NOTE — Progress Notes (Signed)
[facility] [place], [place] [address]  Internal MEDICINE  Office Visit Note  Patient Name: Valerie Ramos  [id number]  [id number]  Date of Service: 06/12/2022  Chief Complaint  Patient presents with   Follow-up    Follow up new med   Diabetes   Depression   Hypertension    HPI Tomekia presents for a follow up visit for hypertension, diabetes, depression and anxiety. Hypertension -- continues to be stable.  Diabetes -- taking glimepiride and metformin twice daily with meals. Focusing on diet modifications as discussed, glucose readings have been improving.  Depression and anxiety --improving, venlafaxine is helping, current dose effective. Alprazolam helping to control her anxiety, takes 1/2 tab in am and 1/2 tab at bedtime. Son has multiple sclerosis and is relapsing and not doing well and her husband has parkinson's disease and is also depressed. This affects Tyniah and contributes to her depressed mood so it is good that the venlafaxine is helping with her depressive symptoms and anxiety level.  Caregiver stress/strain -- helping her husband and taking care of him, trying to talk to him about his emotions and going to multiple doctor's appointments.      Current Medication: Outpatient Encounter Medications as of 06/12/2022  Medication Sig   acetaminophen (TYLENOL) 325 MG tablet Take 2 tablets (650 mg total) by mouth every 6 (six) hours as needed for mild pain (or Fever >/= 101).   atenolol (TENORMIN) 25 MG tablet TAKE 1 TABLET BY MOUTH AT BEDTIME FOR BLOOD PRESSURE   estradiol (ESTRACE) 0.5 MG tablet Take 1 tablet (0.5 mg total) by mouth daily.   fluticasone (FLONASE) 50 MCG/ACT nasal spray SPRAY 2 SPRAYS INTO EACH NOSTRIL EVERY DAY   gabapentin (NEURONTIN) 100 MG capsule TAKE 1 CAPSULE BY MOUTH EVERY MORNING AND 1-3 CAPSULES AT NIGHT   glucose blood (ONE TOUCH ULTRA TEST) test strip FOR ONCE DAILY TESTING DX. E11.65   hydrochlorothiazide (HYDRODIURIL) 25 MG  tablet TAKE 1 TABLET (25 MG TOTAL) BY MOUTH DAILY.   metFORMIN (GLUCOPHAGE) 500 MG tablet Take 1 tablet (500 mg total) by mouth 2 (two) times daily with a meal.   metoCLOPramide (REGLAN) 10 MG tablet Take 1 tablet (10 mg total) by mouth 3 (three) times daily before meals.   OneTouch Delica Lancets 59D MISC 1 each by Does not apply route daily. Use as directed once daily diag e11.65   oxybutynin (DITROPAN-XL) 10 MG 24 hr tablet TAKE 1 TABLET BY MOUTH EVERYDAY AT BEDTIME   pantoprazole (PROTONIX) 40 MG tablet TAKE 1 TABLET BY MOUTH EVERY DAY   PNEUMOCOCCAL 20-VAL CONJ VACC IM Inject into the muscle.   rosuvastatin (CRESTOR) 5 MG tablet TAKE 1 TABLET (5 MG TOTAL) BY MOUTH DAILY.   SUMAtriptan (IMITREX) 50 MG tablet TAKE 1 TABLET EVERY 2 HOURS AS NEEDED FOR MIGRAINE. MAY REPEAT IN 2 HOURS IF HEADACHE PERSISTS OR RECURS. LIMIT TO 2 DOSES PER 24 HOURS   tiZANidine (ZANAFLEX) 2 MG tablet Take 1 tablet (2 mg total) by mouth 2 (two) times daily as needed for muscle spasms.   traMADol (ULTRAM) 50 MG tablet Take 1 tablet (50 mg total) by mouth every 12 (twelve) hours as needed for moderate pain or severe pain.   venlafaxine XR (EFFEXOR-XR) 37.5 MG 24 hr capsule TAKE 1 CAPSULE BY MOUTH DAILY WITH BREAKFAST.   [DISCONTINUED] ALPRAZolam (XANAX) 0.5 MG tablet TAKE 1 TAB BY MOUTH TWICE DAILY AS NEEDED FOR ANXIETY ATTACKS (DO NOT TAKE DAILY DUE TO DEPENDENCE)   [  DISCONTINUED] glimepiride (AMARYL) 1 MG tablet Take 1 tablet by mouth daily with breakfast and dinner.   [DISCONTINUED] losartan (COZAAR) 100 MG tablet TAKE 1 TABLET BY MOUTH EVERY DAY   [DISCONTINUED] zolpidem (AMBIEN) 10 MG tablet Take 1 tablet (10 mg total) by mouth at bedtime.   ALPRAZolam (XANAX) 0.5 MG tablet TAKE 1 TAB BY MOUTH TWICE DAILY AS NEEDED FOR ANXIETY ATTACKS (DO NOT TAKE DAILY DUE TO DEPENDENCE)   glimepiride (AMARYL) 1 MG tablet Take 1 tablet by mouth daily with breakfast and dinner.   losartan (COZAAR) 100 MG tablet Take 1 tablet (100 mg  total) by mouth daily.   zolpidem (AMBIEN) 10 MG tablet Take 1 tablet (10 mg total) by mouth at bedtime.   No facility-administered encounter medications on file as of 06/12/2022.    Surgical History: Past Surgical History:  Procedure Laterality Date   ABDOMINAL HYSTERECTOMY     CESAREAN SECTION  1989   COLONOSCOPY     COLONOSCOPY N/A 11/28/2021   Procedure: COLONOSCOPY;  Surgeon: Lin Landsman, MD;  Location: [facility] ENDOSCOPY;  Service: Gastroenterology;  Laterality: N/A;   CYSTECTOMY  2000   Ovarian   ESOPHAGOGASTRODUODENOSCOPY     OOPHORECTOMY     OVARIAN CYST SURGERY  2006   RECTOVAGINAL FISTULA CLOSURE  1980    Medical History: Past Medical History:  Diagnosis Date   Anxiety    Arthritis    Depression    Diabetes 1.5, managed as type 2 ([place])    Hypertension    Insomnia    Migraines    Reflux    Sinusitis     Family History: Family History  Problem Relation Age of Onset   Hodgkin's lymphoma Mother    Diabetes Mother    Thyroid disease Mother    Glaucoma Mother    Stroke Brother    Heart disease Brother     Social History   Socioeconomic History   Marital status: Married    Spouse name: Not on file   Number of children: Not on file   Years of education: Not on file   Highest education level: Not on file  Occupational History   Not on file  Tobacco Use   Smoking status: Former    Types: Cigarettes    Quit date: 05/24/2011    Years since quitting: 11.0   Smokeless tobacco: Never  Vaping Use   Vaping Use: Not on file  Substance and Sexual Activity   Alcohol use: No   Drug use: No   Sexual activity: Not on file  Other Topics Concern   Not on file  Social History Narrative   Not on file   Social Determinants of Health   Financial Resource Strain: Not on file  Food Insecurity: Not on file  Transportation Needs: Not on file  Physical Activity: Not on file  Stress: Not on file  Social Connections: Not on file  Intimate Partner Violence: Not  on file      Review of Systems  Constitutional:  Negative for chills, fatigue and unexpected weight change.  HENT:  Positive for postnasal drip. Negative for congestion, rhinorrhea, sneezing and sore throat.   Eyes:  Negative for redness.  Respiratory:  Negative for cough, chest tightness and shortness of breath.   Cardiovascular:  Negative for chest pain and palpitations.  Gastrointestinal:  Negative for abdominal pain, constipation, diarrhea, nausea and vomiting.  Genitourinary:  Negative for dysuria and frequency.  Musculoskeletal:  Negative for arthralgias, back pain, joint  swelling and neck pain.  Skin:  Negative for rash.  Neurological: Negative.  Negative for tremors and numbness.  Hematological:  Negative for adenopathy. Does not bruise/bleed easily.  Psychiatric/Behavioral:  Positive for behavioral problems (Depression, improving some) and sleep disturbance (sleeping better). Negative for self-injury and suicidal ideas. The patient is nervous/anxious (improving).     Vital Signs: BP 132/63   Pulse 72   Temp (!) 96 F (35.6 C)   Resp 16   Ht 5' 8.5" (1.74 m)   Wt 245 lb (111.1 kg)   SpO2 97%   BMI 36.71 kg/m    Physical Exam Vitals reviewed.  Constitutional:      General: She is not in acute distress.    Appearance: Normal appearance. She is obese. She is not ill-appearing.  HENT:     Head: Normocephalic and atraumatic.  Eyes:     Pupils: Pupils are equal, round, and reactive to light.  Cardiovascular:     Rate and Rhythm: Normal rate and regular rhythm.  Pulmonary:     Effort: Pulmonary effort is normal. No respiratory distress.  Neurological:     Mental Status: She is alert and oriented to person, place, and time.  Psychiatric:        Mood and Affect: Mood normal.        Behavior: Behavior normal.        Assessment/Plan: 1. Essential (primary) hypertension Stable, continue medications as prescribed - losartan (COZAAR) 100 MG tablet; Take 1 tablet  (100 mg total) by mouth daily.  Dispense: 90 tablet; Refill: 1  2. Type 2 diabetes mellitus with hyperglycemia, without long-term current use of insulin (HCC) Improving glucose readings as discussed, continue diet modifications and continue medications as prescribed - glimepiride (AMARYL) 1 MG tablet; Take 1 tablet by mouth daily with breakfast and dinner.  Dispense: 180 tablet; Refill: 1  3. Generalized anxiety disorder Improving, anxiety has decreased, continue medications as prescribed  - ALPRAZolam (XANAX) 0.5 MG tablet; TAKE 1 TAB BY MOUTH TWICE DAILY AS NEEDED FOR ANXIETY ATTACKS (DO NOT TAKE DAILY DUE TO DEPENDENCE)  Dispense: 60 tablet; Refill: 0  4. Primary insomnia Continue ambien as prescribed.    General Counseling: xianna siverling understanding of the findings of todays visit and agrees with plan of treatment. I have discussed any further diagnostic evaluation that may be needed or ordered today. We also reviewed her medications today. she has been encouraged to call the office with any questions or concerns that should arise related to todays visit.    No orders of the defined types were placed in this encounter.   Meds ordered this encounter  Medications   ALPRAZolam (XANAX) 0.5 MG tablet    Sig: TAKE 1 TAB BY MOUTH TWICE DAILY AS NEEDED FOR ANXIETY ATTACKS (DO NOT TAKE DAILY DUE TO DEPENDENCE)    Dispense:  60 tablet    Refill:  0    Note change in dose, please fill new prescription now   losartan (COZAAR) 100 MG tablet    Sig: Take 1 tablet (100 mg total) by mouth daily.    Dispense:  90 tablet    Refill:  1   glimepiride (AMARYL) 1 MG tablet    Sig: Take 1 tablet by mouth daily with breakfast and dinner.    Dispense:  180 tablet    Refill:  1   zolpidem (AMBIEN) 10 MG tablet    Sig: Take 1 tablet (10 mg total) by mouth at bedtime.  Dispense:  30 tablet    Refill:  2    For future refills, Not to exceed 2 additional fills before 05/26/2022    Return in  about 1 month (around 07/12/2022) for F/U, Anxiety/depression, Summer Mccolgan PCP.   Total time spent:30 Minutes Time spent includes review of chart, medications, test results, and follow up plan with the patient.   [place] Controlled Substance Database was reviewed by me.  This patient was seen by Jonetta Osgood, FNP-C in collaboration with Dr. Clayborn Bigness as a part of collaborative care agreement.   Shylah Dossantos R. Valetta Fuller, MSN, FNP-C Internal medicine

## 2022-06-23 ENCOUNTER — Encounter: Payer: No Typology Code available for payment source | Admitting: Nurse Practitioner

## 2022-06-24 ENCOUNTER — Other Ambulatory Visit: Payer: Self-pay | Admitting: Internal Medicine

## 2022-06-24 ENCOUNTER — Other Ambulatory Visit: Payer: Self-pay | Admitting: Nurse Practitioner

## 2022-06-24 DIAGNOSIS — F5101 Primary insomnia: Secondary | ICD-10-CM

## 2022-07-09 ENCOUNTER — Ambulatory Visit: Payer: No Typology Code available for payment source | Admitting: Nurse Practitioner

## 2022-07-11 ENCOUNTER — Encounter: Payer: No Typology Code available for payment source | Admitting: Nurse Practitioner

## 2022-07-18 ENCOUNTER — Encounter: Payer: Self-pay | Admitting: Nurse Practitioner

## 2022-07-24 ENCOUNTER — Other Ambulatory Visit: Payer: Self-pay | Admitting: Internal Medicine

## 2022-07-24 DIAGNOSIS — F5101 Primary insomnia: Secondary | ICD-10-CM

## 2022-07-28 ENCOUNTER — Other Ambulatory Visit: Payer: Self-pay

## 2022-07-29 ENCOUNTER — Telehealth: Payer: No Typology Code available for payment source | Admitting: Nurse Practitioner

## 2022-07-29 ENCOUNTER — Encounter: Payer: Self-pay | Admitting: Nurse Practitioner

## 2022-07-29 VITALS — Resp 16 | Ht 68.5 in

## 2022-07-29 DIAGNOSIS — J028 Acute pharyngitis due to other specified organisms: Secondary | ICD-10-CM

## 2022-07-29 DIAGNOSIS — B9689 Other specified bacterial agents as the cause of diseases classified elsewhere: Secondary | ICD-10-CM | POA: Diagnosis not present

## 2022-07-29 DIAGNOSIS — U099 Post covid-19 condition, unspecified: Secondary | ICD-10-CM | POA: Diagnosis not present

## 2022-07-29 DIAGNOSIS — R051 Acute cough: Secondary | ICD-10-CM

## 2022-07-29 MED ORDER — AMOXICILLIN-POT CLAVULANATE 875-125 MG PO TABS: 1.0000 | ORAL_TABLET | Freq: Two times a day (BID) | ORAL | 0 refills | Status: DC

## 2022-07-29 MED ORDER — HYDROCOD POLI-CHLORPHE POLI ER 10-8 MG/5ML PO SUER: 5.0000 mL | Freq: Two times a day (BID) | ORAL | 0 refills | Status: DC | PRN

## 2022-07-29 NOTE — Progress Notes (Signed)
[facility] [place], [place] [address]  Internal MEDICINE  Telephone Visit  Patient Name: Valerie Ramos  [id number]  [id number]  Date of Service: 07/29/2022  I connected with the patient at 1200 by telephone and verified the patients identity using two identifiers.   I discussed the limitations, risks, security and privacy concerns of performing an evaluation and management service by telephone and the availability of in person appointments. I also discussed with the patient that there may be a patient responsible charge related to the service.  The patient expressed understanding and agrees to proceed.    Chief Complaint  Patient presents with   Telephone Screen    [phone] Telephone Call   Telephone Assessment   Covid Positive    Tested at beginning of the month   Cough    Coughing up green mucus   Sore Throat    Throbbing sensation, red and irritated    HPI Valerie Ramos presents for a telehealth virtual visit for linger symptoms s/p covid infection. --was covid positive at the beginning of October.  --still has cough with green drainage, sore throat, scratchy and throbbing.     Current Medication: Outpatient Encounter Medications as of 07/29/2022  Medication Sig   acetaminophen (TYLENOL) 325 MG tablet Take 2 tablets (650 mg total) by mouth every 6 (six) hours as needed for mild pain (or Fever >/= 101).   ALPRAZolam (XANAX) 0.5 MG tablet TAKE 1 TAB BY MOUTH TWICE DAILY AS NEEDED FOR ANXIETY ATTACKS (DO NOT TAKE DAILY DUE TO DEPENDENCE)   amoxicillin-clavulanate (AUGMENTIN) 875-125 MG tablet Take 1 tablet by mouth 2 (two) times daily. Take with food   atenolol (TENORMIN) 25 MG tablet TAKE 1 TABLET BY MOUTH AT BEDTIME FOR BLOOD PRESSURE   chlorpheniramine-HYDROcodone (TUSSIONEX) 10-8 MG/5ML Take 5 mLs by mouth every 12 (twelve) hours as needed for cough.   estradiol (ESTRACE) 0.5 MG tablet Take 1 tablet (0.5 mg total) by mouth daily.   fluticasone  (FLONASE) 50 MCG/ACT nasal spray SPRAY 2 SPRAYS INTO EACH NOSTRIL EVERY DAY   gabapentin (NEURONTIN) 100 MG capsule TAKE 1 CAPSULE BY MOUTH EVERY MORNING AND 1-3 CAPSULES AT NIGHT   glimepiride (AMARYL) 1 MG tablet Take 1 tablet by mouth daily with breakfast and dinner.   glucose blood (ONE TOUCH ULTRA TEST) test strip FOR ONCE DAILY TESTING DX. E11.65   hydrochlorothiazide (HYDRODIURIL) 25 MG tablet TAKE 1 TABLET (25 MG TOTAL) BY MOUTH DAILY.   Lancets (ONETOUCH DELICA PLUS [id number]) MISC 1 EACH BY DOES NOT APPLY ROUTE DAILY. USE AS DIRECTED ONCE DAILY DIAG E11.65   losartan (COZAAR) 100 MG tablet Take 1 tablet (100 mg total) by mouth daily.   metFORMIN (GLUCOPHAGE) 500 MG tablet Take 1 tablet (500 mg total) by mouth 2 (two) times daily with a meal.   metoCLOPramide (REGLAN) 10 MG tablet Take 1 tablet (10 mg total) by mouth 3 (three) times daily before meals.   oxybutynin (DITROPAN-XL) 10 MG 24 hr tablet TAKE 1 TABLET BY MOUTH EVERYDAY AT BEDTIME   pantoprazole (PROTONIX) 40 MG tablet TAKE 1 TABLET BY MOUTH EVERY DAY   PNEUMOCOCCAL 20-VAL CONJ VACC IM Inject into the muscle.   rosuvastatin (CRESTOR) 5 MG tablet TAKE 1 TABLET (5 MG TOTAL) BY MOUTH DAILY.   SUMAtriptan (IMITREX) 50 MG tablet TAKE 1 TABLET EVERY 2 HOURS AS NEEDED FOR MIGRAINE. MAY REPEAT IN 2 HOURS IF HEADACHE PERSISTS OR RECURS. LIMIT TO 2 DOSES PER 24 HOURS   tiZANidine (  ZANAFLEX) 2 MG tablet Take 1 tablet (2 mg total) by mouth 2 (two) times daily as needed for muscle spasms.   traMADol (ULTRAM) 50 MG tablet Take 1 tablet (50 mg total) by mouth every 12 (twelve) hours as needed for moderate pain or severe pain.   venlafaxine XR (EFFEXOR-XR) 37.5 MG 24 hr capsule TAKE 1 CAPSULE BY MOUTH DAILY WITH BREAKFAST.   zolpidem (AMBIEN) 10 MG tablet TAKE 1 TABLET BY MOUTH EVERYDAY AT BEDTIME   No facility-administered encounter medications on file as of 07/29/2022.    Surgical History: Past Surgical History:  Procedure Laterality Date    ABDOMINAL HYSTERECTOMY     CESAREAN SECTION  1989   COLONOSCOPY     COLONOSCOPY N/A 11/28/2021   Procedure: COLONOSCOPY;  Surgeon: Lin Landsman, MD;  Location: [facility] ENDOSCOPY;  Service: Gastroenterology;  Laterality: N/A;   CYSTECTOMY  2000   Ovarian   ESOPHAGOGASTRODUODENOSCOPY     OOPHORECTOMY     OVARIAN CYST SURGERY  2006   RECTOVAGINAL FISTULA CLOSURE  1980    Medical History: Past Medical History:  Diagnosis Date   Anxiety    Arthritis    Depression    Diabetes 1.5, managed as type 2 ([place])    Hypertension    Insomnia    Migraines    Reflux    Sinusitis     Family History: Family History  Problem Relation Age of Onset   Hodgkin's lymphoma Mother    Diabetes Mother    Thyroid disease Mother    Glaucoma Mother    Stroke Brother    Heart disease Brother     Social History   Socioeconomic History   Marital status: Married    Spouse name: Not on file   Number of children: Not on file   Years of education: Not on file   Highest education level: Not on file  Occupational History   Not on file  Tobacco Use   Smoking status: Former    Types: Cigarettes    Quit date: 05/24/2011    Years since quitting: 11.1   Smokeless tobacco: Never  Vaping Use   Vaping Use: Not on file  Substance and Sexual Activity   Alcohol use: No   Drug use: No   Sexual activity: Not on file  Other Topics Concern   Not on file  Social History Narrative   Not on file   Social Determinants of Health   Financial Resource Strain: Not on file  Food Insecurity: Not on file  Transportation Needs: Not on file  Physical Activity: Not on file  Stress: Not on file  Social Connections: Not on file  Intimate Partner Violence: Not on file      Review of Systems  Constitutional:  Positive for fatigue. Negative for chills and fever.  HENT:  Positive for congestion, postnasal drip, rhinorrhea, sinus pressure, sinus pain, sore throat and trouble swallowing.   Respiratory:   Positive for cough and chest tightness. Negative for shortness of breath and wheezing.   Cardiovascular: Negative.  Negative for chest pain and palpitations.  Gastrointestinal: Negative.  Negative for abdominal pain, constipation, diarrhea, nausea and vomiting.  Musculoskeletal:  Negative for myalgias.  Neurological:  Positive for headaches. Negative for dizziness and light-headedness.    Vital Signs: Resp 16   Ht 5' 8.5" (1.74 m)   BMI 36.71 kg/m    Observation/Objective: She is alert and oriented and engages in conversation appropriately. She does not sound as though she is  in any acute distress over telephone call.     Assessment/Plan: 1. Acute bacterial pharyngitis Empiric antibiotic treatment prescribed  - amoxicillin-clavulanate (AUGMENTIN) 875-125 MG tablet; Take 1 tablet by mouth 2 (two) times daily. Take with food  Dispense: 20 tablet; Refill: 0  2. Acute cough Tussionex prescribed - chlorpheniramine-HYDROcodone (TUSSIONEX) 10-8 MG/5ML; Take 5 mLs by mouth every 12 (twelve) hours as needed for cough.  Dispense: 140 mL; Refill: 0  3. 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  Dispense: 140 mL; Refill: 0   General Counseling: Valerie Ramos verbalizes understanding of the findings of today's phone visit and agrees with plan of treatment. I have discussed any further diagnostic evaluation that may be needed or ordered today. We also reviewed her medications today. she has been encouraged to call the office with any questions or concerns that should arise related to todays visit.  Return if symptoms worsen or fail to improve.   No orders of the defined types were placed in this encounter.   Meds ordered this encounter  Medications   amoxicillin-clavulanate (AUGMENTIN) 875-125 MG tablet     Sig: Take 1 tablet by mouth 2 (two) times daily. Take with food    Dispense:  20 tablet    Refill:  0   chlorpheniramine-HYDROcodone (TUSSIONEX) 10-8 MG/5ML    Sig: Take 5 mLs by mouth every 12 (twelve) hours as needed for cough.    Dispense:  140 mL    Refill:  0    Time spent:10 Minutes Time spent with patient included reviewing progress notes, labs, imaging studies, and discussing plan for follow up.  [place] Controlled Substance Database was reviewed by me for overdose risk score (ORS) if appropriate.  This patient was seen by Jonetta Osgood, FNP-C in collaboration with Dr. Clayborn Bigness as a part of collaborative care agreement.  Kennedy Bohanon R. Valetta Fuller, MSN, FNP-C Internal medicine

## 2022-08-06 ENCOUNTER — Encounter: Payer: Self-pay | Admitting: Nurse Practitioner

## 2022-08-13 ENCOUNTER — Ambulatory Visit: Payer: No Typology Code available for payment source | Admitting: Nurse Practitioner

## 2022-08-13 ENCOUNTER — Other Ambulatory Visit: Payer: Self-pay | Admitting: Internal Medicine

## 2022-08-13 ENCOUNTER — Other Ambulatory Visit: Payer: Self-pay | Admitting: Nurse Practitioner

## 2022-08-13 DIAGNOSIS — F411 Generalized anxiety disorder: Secondary | ICD-10-CM

## 2022-08-13 DIAGNOSIS — E1142 Type 2 diabetes mellitus with diabetic polyneuropathy: Secondary | ICD-10-CM

## 2022-08-13 DIAGNOSIS — K219 Gastro-esophageal reflux disease without esophagitis: Secondary | ICD-10-CM

## 2022-08-13 MED ORDER — PANTOPRAZOLE SODIUM 40 MG PO TBEC: 40.0000 mg | DELAYED_RELEASE_TABLET | Freq: Every day | ORAL | 1 refills | Status: DC

## 2022-08-14 ENCOUNTER — Other Ambulatory Visit: Payer: Self-pay

## 2022-08-14 ENCOUNTER — Telehealth: Payer: Self-pay

## 2022-08-14 DIAGNOSIS — E1142 Type 2 diabetes mellitus with diabetic polyneuropathy: Secondary | ICD-10-CM

## 2022-08-14 MED ORDER — ROSUVASTATIN CALCIUM 5 MG PO TABS: 5.0000 mg | ORAL_TABLET | Freq: Every day | ORAL | 1 refills | Status: DC

## 2022-08-15 ENCOUNTER — Other Ambulatory Visit: Payer: Self-pay | Admitting: Nurse Practitioner

## 2022-08-15 DIAGNOSIS — F411 Generalized anxiety disorder: Secondary | ICD-10-CM

## 2022-08-15 MED ORDER — ALPRAZOLAM 0.5 MG PO TABS: ORAL_TABLET | ORAL | 0 refills | Status: DC

## 2022-08-15 NOTE — Telephone Encounter (Signed)
Pt advised med sent

## 2022-08-20 ENCOUNTER — Ambulatory Visit: Payer: Self-pay | Admitting: Nurse Practitioner

## 2022-09-03 ENCOUNTER — Other Ambulatory Visit: Payer: Self-pay | Admitting: Nurse Practitioner

## 2022-09-03 DIAGNOSIS — F5101 Primary insomnia: Secondary | ICD-10-CM

## 2022-09-04 ENCOUNTER — Telehealth: Payer: Self-pay

## 2022-09-08 ENCOUNTER — Other Ambulatory Visit: Payer: Self-pay | Admitting: Internal Medicine

## 2022-09-08 ENCOUNTER — Telehealth: Payer: Self-pay

## 2022-09-08 MED ORDER — ZOLPIDEM TARTRATE 10 MG PO TABS: ORAL_TABLET | ORAL | 0 refills | Status: DC

## 2022-09-08 NOTE — Telephone Encounter (Signed)
Pt advised that we send change on direction as per dfk

## 2022-09-18 NOTE — Telephone Encounter (Signed)
done

## 2022-10-13 ENCOUNTER — Other Ambulatory Visit: Payer: Self-pay | Admitting: Nurse Practitioner

## 2022-10-13 DIAGNOSIS — F411 Generalized anxiety disorder: Secondary | ICD-10-CM

## 2022-10-15 DIAGNOSIS — M542 Cervicalgia: Secondary | ICD-10-CM | POA: Diagnosis not present

## 2022-10-15 DIAGNOSIS — M7542 Impingement syndrome of left shoulder: Secondary | ICD-10-CM | POA: Diagnosis not present

## 2022-10-15 DIAGNOSIS — M5412 Radiculopathy, cervical region: Secondary | ICD-10-CM | POA: Diagnosis not present

## 2022-10-22 ENCOUNTER — Ambulatory Visit: Payer: 59 | Admitting: Nurse Practitioner

## 2022-10-22 ENCOUNTER — Other Ambulatory Visit: Payer: Self-pay | Admitting: Nurse Practitioner

## 2022-10-22 ENCOUNTER — Encounter: Payer: Self-pay | Admitting: Nurse Practitioner

## 2022-10-22 VITALS — BP 144/70 | HR 76 | Temp 97.7°F | Resp 16 | Ht 68.5 in | Wt 240.6 lb

## 2022-10-22 DIAGNOSIS — R69 Illness, unspecified: Secondary | ICD-10-CM | POA: Diagnosis not present

## 2022-10-22 DIAGNOSIS — F331 Major depressive disorder, recurrent, moderate: Secondary | ICD-10-CM | POA: Diagnosis not present

## 2022-10-22 DIAGNOSIS — M549 Dorsalgia, unspecified: Secondary | ICD-10-CM

## 2022-10-22 DIAGNOSIS — E1165 Type 2 diabetes mellitus with hyperglycemia: Secondary | ICD-10-CM

## 2022-10-22 DIAGNOSIS — G43009 Migraine without aura, not intractable, without status migrainosus: Secondary | ICD-10-CM

## 2022-10-22 DIAGNOSIS — E1142 Type 2 diabetes mellitus with diabetic polyneuropathy: Secondary | ICD-10-CM

## 2022-10-22 DIAGNOSIS — N3281 Overactive bladder: Secondary | ICD-10-CM

## 2022-10-22 DIAGNOSIS — Z79899 Other long term (current) drug therapy: Secondary | ICD-10-CM

## 2022-10-22 DIAGNOSIS — K219 Gastro-esophageal reflux disease without esophagitis: Secondary | ICD-10-CM

## 2022-10-22 DIAGNOSIS — N393 Stress incontinence (female) (male): Secondary | ICD-10-CM

## 2022-10-22 DIAGNOSIS — F5101 Primary insomnia: Secondary | ICD-10-CM

## 2022-10-22 DIAGNOSIS — M546 Pain in thoracic spine: Secondary | ICD-10-CM

## 2022-10-22 DIAGNOSIS — Z636 Dependent relative needing care at home: Secondary | ICD-10-CM

## 2022-10-22 DIAGNOSIS — I10 Essential (primary) hypertension: Secondary | ICD-10-CM

## 2022-10-22 LAB — POCT GLYCOSYLATED HEMOGLOBIN (HGB A1C): Hemoglobin A1C: 8.1 % — AB (ref 4.0–5.6)

## 2022-10-22 MED ORDER — ROSUVASTATIN CALCIUM 5 MG PO TABS: 5.0000 mg | ORAL_TABLET | Freq: Every day | ORAL | 5 refills | Status: DC

## 2022-10-22 MED ORDER — GLIMEPIRIDE 1 MG PO TABS: ORAL_TABLET | ORAL | 5 refills | Status: DC

## 2022-10-22 MED ORDER — ATENOLOL 25 MG PO TABS: ORAL_TABLET | ORAL | 5 refills | Status: DC

## 2022-10-22 MED ORDER — HYDROCHLOROTHIAZIDE 25 MG PO TABS: 25.0000 mg | ORAL_TABLET | Freq: Every day | ORAL | 5 refills | Status: DC

## 2022-10-22 MED ORDER — OXYBUTYNIN CHLORIDE ER 10 MG PO TB24: ORAL_TABLET | ORAL | 5 refills | Status: DC

## 2022-10-22 MED ORDER — METFORMIN HCL 500 MG PO TABS: 500.0000 mg | ORAL_TABLET | Freq: Two times a day (BID) | ORAL | 5 refills | Status: DC

## 2022-10-22 MED ORDER — PANTOPRAZOLE SODIUM 40 MG PO TBEC: 40.0000 mg | DELAYED_RELEASE_TABLET | Freq: Every day | ORAL | 5 refills | Status: DC

## 2022-10-22 MED ORDER — TRAMADOL HCL 50 MG PO TABS: 50.0000 mg | ORAL_TABLET | Freq: Two times a day (BID) | ORAL | 0 refills | Status: DC | PRN

## 2022-10-22 MED ORDER — LOSARTAN POTASSIUM 100 MG PO TABS: 100.0000 mg | ORAL_TABLET | Freq: Every day | ORAL | 5 refills | Status: DC

## 2022-10-22 MED ORDER — GABAPENTIN 100 MG PO CAPS: ORAL_CAPSULE | ORAL | 5 refills | Status: DC

## 2022-10-22 MED ORDER — METOCLOPRAMIDE HCL 10 MG PO TABS: 10.0000 mg | ORAL_TABLET | Freq: Three times a day (TID) | ORAL | 5 refills | Status: DC

## 2022-10-22 MED ORDER — SUMATRIPTAN SUCCINATE 50 MG PO TABS: ORAL_TABLET | ORAL | 3 refills | Status: DC

## 2022-10-22 MED ORDER — TIZANIDINE HCL 2 MG PO TABS: 2.0000 mg | ORAL_TABLET | Freq: Two times a day (BID) | ORAL | 5 refills | Status: DC | PRN

## 2022-10-22 MED ORDER — ESTRADIOL 0.5 MG PO TABS: 0.5000 mg | ORAL_TABLET | Freq: Every day | ORAL | 5 refills | Status: DC

## 2022-10-22 MED ORDER — ZOLPIDEM TARTRATE 10 MG PO TABS: ORAL_TABLET | ORAL | 2 refills | Status: DC

## 2022-10-22 MED ORDER — VENLAFAXINE HCL ER 37.5 MG PO CP24: 37.5000 mg | ORAL_CAPSULE | Freq: Every day | ORAL | 5 refills | Status: DC

## 2022-10-22 NOTE — Progress Notes (Signed)
[facility] [place], [place] [address]  Internal MEDICINE  Office Visit Note  Patient Name: Valerie Ramos  [id number]  [id number]  Date of Service: 10/22/2022  Chief Complaint  Patient presents with   Follow-up   Hypertension   Diabetes   Depression    HPI Avaleigh presents for a follow-up visit for depression, diabetes, anxiety, caregiver stress. Caregiver for husband -- increased stress. Has noticed her husbands parkinson's getting worse, is starting to fall sometimes and having difficulty feeding himself due to tremors. This has been emotionally upsetting for the patient to watch.  Diabetes  --  A1c has improved some to 8.1 today from 8.4 previously. She has been trying to eat better and be more active.  Depression -- taking her medication which helps. Watching her husband's parkinson's and her son's MS progress. Depressive symptoms are still better now than they have been and the medication is keeping her from letting her symptoms interfere in her daily life but she still feels sad sometimes.      Current Medication: Outpatient Encounter Medications as of 10/22/2022  Medication Sig   acetaminophen (TYLENOL) 325 MG tablet Take 2 tablets (650 mg total) by mouth every 6 (six) hours as needed for mild pain (or Fever >/= 101).   ALPRAZolam (XANAX) 0.5 MG tablet TAKE 1 TAB BY MOUTH TWICE DAILY AS NEEDED FOR ANXIETY ATTACKS (DO NOT TAKE DAILY DUE TO DEPENDENCE)   fluticasone (FLONASE) 50 MCG/ACT nasal spray SPRAY 2 SPRAYS INTO EACH NOSTRIL EVERY DAY   Lancets (ONETOUCH DELICA PLUS [id number]) MISC 1 EACH BY DOES NOT APPLY ROUTE DAILY. USE AS DIRECTED ONCE DAILY DIAG E11.65   ONETOUCH ULTRA test strip FOR ONCE DAILY TESTING DX. E11.65   PNEUMOCOCCAL 20-VAL CONJ VACC IM Inject into the muscle.   [DISCONTINUED] amoxicillin-clavulanate (AUGMENTIN) 875-125 MG tablet Take 1 tablet by mouth 2 (two) times daily. Take with food   [DISCONTINUED] atenolol (TENORMIN) 25 MG  tablet TAKE 1 TABLET BY MOUTH AT BEDTIME FOR BLOOD PRESSURE   [DISCONTINUED] chlorpheniramine-HYDROcodone (TUSSIONEX) 10-8 MG/5ML Take 5 mLs by mouth every 12 (twelve) hours as needed for cough.   [DISCONTINUED] estradiol (ESTRACE) 0.5 MG tablet Take 1 tablet (0.5 mg total) by mouth daily.   [DISCONTINUED] gabapentin (NEURONTIN) 100 MG capsule TAKE 1 CAPSULE BY MOUTH EVERY MORNING AND 1-3 CAPSULES AT NIGHT   [DISCONTINUED] glimepiride (AMARYL) 1 MG tablet Take 1 tablet by mouth daily with breakfast and dinner.   [DISCONTINUED] hydrochlorothiazide (HYDRODIURIL) 25 MG tablet TAKE 1 TABLET (25 MG TOTAL) BY MOUTH DAILY.   [DISCONTINUED] losartan (COZAAR) 100 MG tablet Take 1 tablet (100 mg total) by mouth daily.   [DISCONTINUED] metFORMIN (GLUCOPHAGE) 500 MG tablet Take 1 tablet (500 mg total) by mouth 2 (two) times daily with a meal.   [DISCONTINUED] metoCLOPramide (REGLAN) 10 MG tablet Take 1 tablet (10 mg total) by mouth 3 (three) times daily before meals.   [DISCONTINUED] oxybutynin (DITROPAN-XL) 10 MG 24 hr tablet TAKE 1 TABLET BY MOUTH EVERYDAY AT BEDTIME   [DISCONTINUED] pantoprazole (PROTONIX) 40 MG tablet Take 1 tablet (40 mg total) by mouth daily.   [DISCONTINUED] rosuvastatin (CRESTOR) 5 MG tablet Take 1 tablet (5 mg total) by mouth daily.   [DISCONTINUED] SUMAtriptan (IMITREX) 50 MG tablet TAKE 1 TABLET EVERY 2 HOURS AS NEEDED FOR MIGRAINE. MAY REPEAT IN 2 HOURS IF HEADACHE PERSISTS OR RECURS. LIMIT TO 2 DOSES PER 24 HOURS   [DISCONTINUED] tiZANidine (ZANAFLEX) 2 MG tablet Take 1 tablet (  2 mg total) by mouth 2 (two) times daily as needed for muscle spasms.   [DISCONTINUED] traMADol (ULTRAM) 50 MG tablet Take 1 tablet (50 mg total) by mouth every 12 (twelve) hours as needed for moderate pain or severe pain.   [DISCONTINUED] venlafaxine XR (EFFEXOR-XR) 37.5 MG 24 hr capsule TAKE 1 CAPSULE BY MOUTH DAILY WITH BREAKFAST.   [DISCONTINUED] zolpidem (AMBIEN) 10 MG tablet TAKE 1 TABLET BY MOUTH  EVERYDAY AT BEDTIME   [DISCONTINUED] zolpidem (AMBIEN) 10 MG tablet Take half tab po qhs as needed for insomnia   atenolol (TENORMIN) 25 MG tablet TAKE 1 TABLET BY MOUTH AT BEDTIME FOR BLOOD PRESSURE   estradiol (ESTRACE) 0.5 MG tablet Take 1 tablet (0.5 mg total) by mouth daily.   gabapentin (NEURONTIN) 100 MG capsule TAKE 1 CAPSULE BY MOUTH EVERY MORNING AND 1 CAPSULES AT NIGHT   glimepiride (AMARYL) 1 MG tablet Take 1 tablet by mouth daily with breakfast and dinner.   hydrochlorothiazide (HYDRODIURIL) 25 MG tablet Take 1 tablet (25 mg total) by mouth daily.   losartan (COZAAR) 100 MG tablet Take 1 tablet (100 mg total) by mouth daily.   metFORMIN (GLUCOPHAGE) 500 MG tablet Take 1 tablet (500 mg total) by mouth 2 (two) times daily with a meal.   metoCLOPramide (REGLAN) 10 MG tablet Take 1 tablet (10 mg total) by mouth 3 (three) times daily before meals.   oxybutynin (DITROPAN-XL) 10 MG 24 hr tablet TAKE 1 TABLET BY MOUTH EVERYDAY AT BEDTIME   pantoprazole (PROTONIX) 40 MG tablet Take 1 tablet (40 mg total) by mouth daily.   rosuvastatin (CRESTOR) 5 MG tablet Take 1 tablet (5 mg total) by mouth daily.   SUMAtriptan (IMITREX) 50 MG tablet TAKE 1 TABLET EVERY 2 HOURS AS NEEDED FOR MIGRAINE. MAY REPEAT IN 2 HOURS IF HEADACHE PERSISTS OR RECURS. LIMIT TO 2 DOSES PER 24 HOURS   tiZANidine (ZANAFLEX) 2 MG tablet Take 1 tablet (2 mg total) by mouth 2 (two) times daily as needed for muscle spasms.   traMADol (ULTRAM) 50 MG tablet Take 1 tablet (50 mg total) by mouth every 12 (twelve) hours as needed for moderate pain or severe pain.   venlafaxine XR (EFFEXOR-XR) 37.5 MG 24 hr capsule Take 1 capsule (37.5 mg total) by mouth daily with breakfast.   zolpidem (AMBIEN) 10 MG tablet TAKE 1 TABLET BY MOUTH EVERYDAY AT BEDTIME   No facility-administered encounter medications on file as of 10/22/2022.    Surgical History: Past Surgical History:  Procedure Laterality Date   ABDOMINAL HYSTERECTOMY      CESAREAN SECTION  1989   COLONOSCOPY     COLONOSCOPY N/A 11/28/2021   Procedure: COLONOSCOPY;  Surgeon: Lin Landsman, MD;  Location: [facility] ENDOSCOPY;  Service: Gastroenterology;  Laterality: N/A;   CYSTECTOMY  2000   Ovarian   ESOPHAGOGASTRODUODENOSCOPY     OOPHORECTOMY     OVARIAN CYST SURGERY  2006   RECTOVAGINAL FISTULA CLOSURE  1980    Medical History: Past Medical History:  Diagnosis Date   Anxiety    Arthritis    Depression    Diabetes 1.5, managed as type 2 ([place])    Hypertension    Insomnia    Migraines    Reflux    Sinusitis     Family History: Family History  Problem Relation Age of Onset   Hodgkin's lymphoma Mother    Diabetes Mother    Thyroid disease Mother    Glaucoma Mother    Stroke Brother  Heart disease Brother     Social History   Socioeconomic History   Marital status: Married    Spouse name: Not on file   Number of children: Not on file   Years of education: Not on file   Highest education level: Not on file  Occupational History   Not on file  Tobacco Use   Smoking status: Former    Types: Cigarettes    Quit date: 05/24/2011    Years since quitting: 11.4   Smokeless tobacco: Never  Vaping Use   Vaping Use: Not on file  Substance and Sexual Activity   Alcohol use: No   Drug use: No   Sexual activity: Not on file  Other Topics Concern   Not on file  Social History Narrative   Not on file   Social Determinants of Health   Financial Resource Strain: Not on file  Food Insecurity: Not on file  Transportation Needs: Not on file  Physical Activity: Not on file  Stress: Not on file  Social Connections: Not on file  Intimate Partner Violence: Not on file      Review of Systems  Constitutional:  Negative for chills, fatigue and unexpected weight change.  HENT:  Positive for postnasal drip. Negative for congestion, rhinorrhea, sneezing and sore throat.   Eyes:  Negative for redness.  Respiratory:  Negative for cough, chest  tightness and shortness of breath.   Cardiovascular:  Negative for chest pain and palpitations.  Gastrointestinal:  Negative for abdominal pain, constipation, diarrhea, nausea and vomiting.  Genitourinary:  Negative for dysuria and frequency.  Musculoskeletal:  Negative for arthralgias, back pain, joint swelling and neck pain.  Skin:  Negative for rash.  Neurological: Negative.  Negative for tremors and numbness.  Hematological:  Negative for adenopathy. Does not bruise/bleed easily.  Psychiatric/Behavioral:  Positive for behavioral problems (Depression, improving some) and sleep disturbance (sleeping better). Negative for self-injury and suicidal ideas. The patient is nervous/anxious (improving).     Vital Signs: BP (!) 144/70   Pulse 76   Temp 97.7 F (36.5 C)   Resp 16   Ht 5' 8.5" (1.74 m)   Wt 240 lb 9.6 oz (109.1 kg)   SpO2 97%   BMI 36.05 kg/m    Physical Exam Vitals reviewed.  Constitutional:      General: She is not in acute distress.    Appearance: Normal appearance. She is obese. She is not ill-appearing.  HENT:     Head: Normocephalic and atraumatic.  Eyes:     Pupils: Pupils are equal, round, and reactive to light.  Cardiovascular:     Rate and Rhythm: Normal rate and regular rhythm.  Pulmonary:     Effort: Pulmonary effort is normal. No respiratory distress.  Neurological:     Mental Status: She is alert and oriented to person, place, and time.  Psychiatric:        Mood and Affect: Mood normal.        Behavior: Behavior normal.        Assessment/Plan: 1. Type 2 diabetes mellitus with hyperglycemia, without long-term current use of insulin (HCC) A1c showing some improvement. Continue medications as prescribed. Follow up in 3 months to repeat A1c.  - POCT glycosylated hemoglobin (Hb A1C) - Urine Microalbumin w/creat. ratio - metFORMIN (GLUCOPHAGE) 500 MG tablet; Take 1 tablet (500 mg total) by mouth 2 (two) times daily with a meal.  Dispense: 60  tablet; Refill: 5 - glimepiride (AMARYL) 1 MG tablet; Take  1 tablet by mouth daily with breakfast and dinner.  Dispense: 60 tablet; Refill: 5 - atenolol (TENORMIN) 25 MG tablet; TAKE 1 TABLET BY MOUTH AT BEDTIME FOR BLOOD PRESSURE  Dispense: 30 tablet; Refill: 5  2. Caregiver stress Encouraged patient to call the clinic if she is having difficulty coping, and try to make time for self-care. Also communication with her husband is encouraged and it is important that she discusses how she is feeling with him.   3. Encounter for medication review Medication list reviewed, all refills ordered - venlafaxine XR (EFFEXOR-XR) 37.5 MG 24 hr capsule; Take 1 capsule (37.5 mg total) by mouth daily with breakfast.  Dispense: 30 capsule; Refill: 5 - traMADol (ULTRAM) 50 MG tablet; Take 1 tablet (50 mg total) by mouth every 12 (twelve) hours as needed for moderate pain or severe pain.  Dispense: 60 tablet; Refill: 0 - SUMAtriptan (IMITREX) 50 MG tablet; TAKE 1 TABLET EVERY 2 HOURS AS NEEDED FOR MIGRAINE. MAY REPEAT IN 2 HOURS IF HEADACHE PERSISTS OR RECURS. LIMIT TO 2 DOSES PER 24 HOURS  Dispense: 10 tablet; Refill: 3 - losartan (COZAAR) 100 MG tablet; Take 1 tablet (100 mg total) by mouth daily.  Dispense: 30 tablet; Refill: 5 - rosuvastatin (CRESTOR) 5 MG tablet; Take 1 tablet (5 mg total) by mouth daily.  Dispense: 30 tablet; Refill: 5 - pantoprazole (PROTONIX) 40 MG tablet; Take 1 tablet (40 mg total) by mouth daily.  Dispense: 30 tablet; Refill: 5 - oxybutynin (DITROPAN-XL) 10 MG 24 hr tablet; TAKE 1 TABLET BY MOUTH EVERYDAY AT BEDTIME  Dispense: 30 tablet; Refill: 5 - metoCLOPramide (REGLAN) 10 MG tablet; Take 1 tablet (10 mg total) by mouth 3 (three) times daily before meals.  Dispense: 90 tablet; Refill: 5 - metFORMIN (GLUCOPHAGE) 500 MG tablet; Take 1 tablet (500 mg total) by mouth 2 (two) times daily with a meal.  Dispense: 60 tablet; Refill: 5 - tiZANidine (ZANAFLEX) 2 MG tablet; Take 1 tablet (2 mg  total) by mouth 2 (two) times daily as needed for muscle spasms.  Dispense: 60 tablet; Refill: 5 - hydrochlorothiazide (HYDRODIURIL) 25 MG tablet; Take 1 tablet (25 mg total) by mouth daily.  Dispense: 30 tablet; Refill: 5 - glimepiride (AMARYL) 1 MG tablet; Take 1 tablet by mouth daily with breakfast and dinner.  Dispense: 60 tablet; Refill: 5 - gabapentin (NEURONTIN) 100 MG capsule; TAKE 1 CAPSULE BY MOUTH EVERY MORNING AND 1 CAPSULES AT NIGHT  Dispense: 60 capsule; Refill: 5 - estradiol (ESTRACE) 0.5 MG tablet; Take 1 tablet (0.5 mg total) by mouth daily.  Dispense: 30 tablet; Refill: 5  4. Moderate episode of recurrent major depressive disorder (HCC) Continue venlafaxine as prescribed. Discontinue escitalopram if not already stopped - venlafaxine XR (EFFEXOR-XR) 37.5 MG 24 hr capsule; Take 1 capsule (37.5 mg total) by mouth daily with breakfast.  Dispense: 30 capsule; Refill: 5  5. Primary insomnia May continue ambien as prescribed - zolpidem (AMBIEN) 10 MG tablet; TAKE 1 TABLET BY MOUTH EVERYDAY AT BEDTIME  Dispense: 30 tablet; Refill: 2   General Counseling: Alissandra verbalizes understanding of the findings of todays visit and agrees with plan of treatment. I have discussed any further diagnostic evaluation that may be needed or ordered today. We also reviewed her medications today. she has been encouraged to call the office with any questions or concerns that should arise related to todays visit.    Orders Placed This Encounter  Procedures   Urine Microalbumin w/creat. ratio   POCT glycosylated hemoglobin (  Hb A1C)    Meds ordered this encounter  Medications   zolpidem (AMBIEN) 10 MG tablet    Sig: TAKE 1 TABLET BY MOUTH EVERYDAY AT BEDTIME    Dispense:  30 tablet    Refill:  2    Not to exceed 5 additional fills before 12/21/2022   venlafaxine XR (EFFEXOR-XR) 37.5 MG 24 hr capsule    Sig: Take 1 capsule (37.5 mg total) by mouth daily with breakfast.    Dispense:  30 capsule     Refill:  5   traMADol (ULTRAM) 50 MG tablet    Sig: Take 1 tablet (50 mg total) by mouth every 12 (twelve) hours as needed for moderate pain or severe pain.    Dispense:  60 tablet    Refill:  0   SUMAtriptan (IMITREX) 50 MG tablet    Sig: TAKE 1 TABLET EVERY 2 HOURS AS NEEDED FOR MIGRAINE. MAY REPEAT IN 2 HOURS IF HEADACHE PERSISTS OR RECURS. LIMIT TO 2 DOSES PER 24 HOURS    Dispense:  10 tablet    Refill:  3   losartan (COZAAR) 100 MG tablet    Sig: Take 1 tablet (100 mg total) by mouth daily.    Dispense:  30 tablet    Refill:  5   rosuvastatin (CRESTOR) 5 MG tablet    Sig: Take 1 tablet (5 mg total) by mouth daily.    Dispense:  30 tablet    Refill:  5   pantoprazole (PROTONIX) 40 MG tablet    Sig: Take 1 tablet (40 mg total) by mouth daily.    Dispense:  30 tablet    Refill:  5   oxybutynin (DITROPAN-XL) 10 MG 24 hr tablet    Sig: TAKE 1 TABLET BY MOUTH EVERYDAY AT BEDTIME    Dispense:  30 tablet    Refill:  5   metoCLOPramide (REGLAN) 10 MG tablet    Sig: Take 1 tablet (10 mg total) by mouth 3 (three) times daily before meals.    Dispense:  90 tablet    Refill:  5   metFORMIN (GLUCOPHAGE) 500 MG tablet    Sig: Take 1 tablet (500 mg total) by mouth 2 (two) times daily with a meal.    Dispense:  60 tablet    Refill:  5   tiZANidine (ZANAFLEX) 2 MG tablet    Sig: Take 1 tablet (2 mg total) by mouth 2 (two) times daily as needed for muscle spasms.    Dispense:  60 tablet    Refill:  5   hydrochlorothiazide (HYDRODIURIL) 25 MG tablet    Sig: Take 1 tablet (25 mg total) by mouth daily.    Dispense:  30 tablet    Refill:  5   glimepiride (AMARYL) 1 MG tablet    Sig: Take 1 tablet by mouth daily with breakfast and dinner.    Dispense:  60 tablet    Refill:  5   gabapentin (NEURONTIN) 100 MG capsule    Sig: TAKE 1 CAPSULE BY MOUTH EVERY MORNING AND 1 CAPSULES AT NIGHT    Dispense:  60 capsule    Refill:  5   estradiol (ESTRACE) 0.5 MG tablet    Sig: Take 1 tablet (0.5  mg total) by mouth daily.    Dispense:  30 tablet    Refill:  5    For future refills   atenolol (TENORMIN) 25 MG tablet    Sig: TAKE 1 TABLET BY MOUTH AT BEDTIME  FOR BLOOD PRESSURE    Dispense:  30 tablet    Refill:  5    Return in about 3 months (around 01/27/2023) for F/U, Recheck A1C, Malak Orantes PCP.   Total time spent:30 Minutes Time spent includes review of chart, medications, test results, and follow up plan with the patient.   [place] Controlled Substance Database was reviewed by me.  This patient was seen by Jonetta Osgood, FNP-C in collaboration with Dr. Clayborn Bigness as a part of collaborative care agreement.   Tashena Ibach R. Valetta Fuller, MSN, FNP-C Internal medicine

## 2022-10-23 DIAGNOSIS — M542 Cervicalgia: Secondary | ICD-10-CM | POA: Diagnosis not present

## 2022-10-23 DIAGNOSIS — M25612 Stiffness of left shoulder, not elsewhere classified: Secondary | ICD-10-CM | POA: Diagnosis not present

## 2022-10-23 DIAGNOSIS — M25512 Pain in left shoulder: Secondary | ICD-10-CM | POA: Diagnosis not present

## 2022-10-24 ENCOUNTER — Telehealth: Payer: Self-pay

## 2022-10-24 LAB — MICROALBUMIN / CREATININE URINE RATIO
Creatinine, Urine: 115.6 mg/dL
Microalb/Creat Ratio: 36 mg/g creat — ABNORMAL HIGH (ref 0–29)
Microalbumin, Urine: 41.1 ug/mL

## 2022-10-24 NOTE — Telephone Encounter (Signed)
Completed P.A. and received approval for patient's Tramadol.

## 2022-10-25 ENCOUNTER — Encounter: Payer: Self-pay | Admitting: Nurse Practitioner

## 2022-10-25 DIAGNOSIS — F331 Major depressive disorder, recurrent, moderate: Secondary | ICD-10-CM | POA: Insufficient documentation

## 2022-11-03 ENCOUNTER — Other Ambulatory Visit: Payer: Self-pay | Admitting: Internal Medicine

## 2022-11-03 DIAGNOSIS — F5101 Primary insomnia: Secondary | ICD-10-CM

## 2022-11-10 ENCOUNTER — Other Ambulatory Visit: Payer: Self-pay | Admitting: Nurse Practitioner

## 2022-11-10 DIAGNOSIS — F331 Major depressive disorder, recurrent, moderate: Secondary | ICD-10-CM

## 2022-11-10 DIAGNOSIS — Z79899 Other long term (current) drug therapy: Secondary | ICD-10-CM

## 2022-11-12 ENCOUNTER — Telehealth: Payer: Self-pay | Admitting: Nurse Practitioner

## 2022-11-19 ENCOUNTER — Encounter: Payer: Self-pay | Admitting: Nurse Practitioner

## 2022-11-19 ENCOUNTER — Telehealth: Payer: Self-pay | Admitting: Nurse Practitioner

## 2022-11-19 ENCOUNTER — Telehealth: Payer: 59 | Admitting: Nurse Practitioner

## 2022-11-19 ENCOUNTER — Other Ambulatory Visit: Payer: Self-pay | Admitting: Internal Medicine

## 2022-11-19 VITALS — Resp 16 | Ht 68.5 in | Wt 235.0 lb

## 2022-11-19 DIAGNOSIS — J029 Acute pharyngitis, unspecified: Secondary | ICD-10-CM

## 2022-11-19 DIAGNOSIS — Z20818 Contact with and (suspected) exposure to other bacterial communicable diseases: Secondary | ICD-10-CM

## 2022-11-19 DIAGNOSIS — Z1231 Encounter for screening mammogram for malignant neoplasm of breast: Secondary | ICD-10-CM

## 2022-11-19 MED ORDER — AMOXICILLIN-POT CLAVULANATE 875-125 MG PO TABS: 1.0000 | ORAL_TABLET | Freq: Two times a day (BID) | ORAL | 0 refills | Status: DC

## 2022-11-19 NOTE — Progress Notes (Signed)
[facility] [place], [place] [address]  Internal MEDICINE  Telephone Visit  Patient Name: Valerie Ramos  [id number]  [id number]  Date of Service: 11/19/2022  I connected with the patient at 1110 by telephone and verified the patients identity using two identifiers.   I discussed the limitations, risks, security and privacy concerns of performing an evaluation and management service by telephone and the availability of in person appointments. I also discussed with the patient that there may be a patient responsible charge related to the service.  The patient expressed understanding and agrees to proceed.    Chief Complaint  Patient presents with   Telephone Screen    Sore throat, daugther is an Therapist, sports and yesterday afternoon she said there is white patches and was around someone with strep 2 weeks ago. Covid test was negative. [phone].    Telephone Assessment    HPI Valerie Ramos presents for a telehealth virtual visit for possible strep throat. Reports having white patches seen in throat by her daughter who is a Marine scientist. She was exposed to another person who had strep throat 2 weeks ago.  Symptoms onset -- Monday Reports scratchy sore throat, headache, right ear pain, sinus drainage, and nasal drainage that is green.    Current Medication: Outpatient Encounter Medications as of 11/19/2022  Medication Sig   acetaminophen (TYLENOL) 325 MG tablet Take 2 tablets (650 mg total) by mouth every 6 (six) hours as needed for mild pain (or Fever >/= 101).   ALPRAZolam (XANAX) 0.5 MG tablet TAKE 1 TAB BY MOUTH TWICE DAILY AS NEEDED FOR ANXIETY ATTACKS (DO NOT TAKE DAILY DUE TO DEPENDENCE)   amoxicillin-clavulanate (AUGMENTIN) 875-125 MG tablet Take 1 tablet by mouth 2 (two) times daily. Take with food.   atenolol (TENORMIN) 25 MG tablet TAKE 1 TABLET BY MOUTH AT BEDTIME FOR BLOOD PRESSURE   estradiol (ESTRACE) 0.5 MG tablet Take 1 tablet (0.5 mg total) by mouth daily.    fluticasone (FLONASE) 50 MCG/ACT nasal spray SPRAY 2 SPRAYS INTO EACH NOSTRIL EVERY DAY   gabapentin (NEURONTIN) 100 MG capsule TAKE 1 CAPSULE BY MOUTH EVERY MORNING AND 1 CAPSULES AT NIGHT   glimepiride (AMARYL) 1 MG tablet Take 1 tablet by mouth daily with breakfast and dinner.   hydrochlorothiazide (HYDRODIURIL) 25 MG tablet Take 1 tablet (25 mg total) by mouth daily.   Lancets (ONETOUCH DELICA PLUS [id number]) MISC 1 EACH BY DOES NOT APPLY ROUTE DAILY. USE AS DIRECTED ONCE DAILY DIAG E11.65   losartan (COZAAR) 100 MG tablet Take 1 tablet (100 mg total) by mouth daily.   metFORMIN (GLUCOPHAGE) 500 MG tablet Take 1 tablet (500 mg total) by mouth 2 (two) times daily with a meal.   metoCLOPramide (REGLAN) 10 MG tablet Take 1 tablet (10 mg total) by mouth 3 (three) times daily before meals.   ONETOUCH ULTRA test strip FOR ONCE DAILY TESTING DX. E11.65   oxybutynin (DITROPAN-XL) 10 MG 24 hr tablet TAKE 1 TABLET BY MOUTH EVERYDAY AT BEDTIME   pantoprazole (PROTONIX) 40 MG tablet Take 1 tablet (40 mg total) by mouth daily.   PNEUMOCOCCAL 20-VAL CONJ VACC IM Inject into the muscle.   rosuvastatin (CRESTOR) 5 MG tablet Take 1 tablet (5 mg total) by mouth daily.   SUMAtriptan (IMITREX) 50 MG tablet TAKE 1 TABLET EVERY 2 HOURS AS NEEDED FOR MIGRAINE. MAY REPEAT IN 2 HOURS IF HEADACHE PERSISTS OR RECURS. LIMIT TO 2 DOSES PER 24 HOURS   tiZANidine (ZANAFLEX) 2 MG tablet  Take 1 tablet (2 mg total) by mouth 2 (two) times daily as needed for muscle spasms.   traMADol (ULTRAM) 50 MG tablet Take 1 tablet (50 mg total) by mouth every 12 (twelve) hours as needed for moderate pain or severe pain.   venlafaxine XR (EFFEXOR-XR) 37.5 MG 24 hr capsule TAKE 1 CAPSULE BY MOUTH DAILY WITH BREAKFAST.   zolpidem (AMBIEN) 10 MG tablet TAKE 1 TABLET BY MOUTH EVERYDAY AT BEDTIME   No facility-administered encounter medications on file as of 11/19/2022.    Surgical History: Past Surgical History:  Procedure Laterality Date    ABDOMINAL HYSTERECTOMY     CESAREAN SECTION  1989   COLONOSCOPY     COLONOSCOPY N/A 11/28/2021   Procedure: COLONOSCOPY;  Surgeon: Lin Landsman, MD;  Location: [facility] ENDOSCOPY;  Service: Gastroenterology;  Laterality: N/A;   CYSTECTOMY  2000   Ovarian   ESOPHAGOGASTRODUODENOSCOPY     OOPHORECTOMY     OVARIAN CYST SURGERY  2006   RECTOVAGINAL FISTULA CLOSURE  1980    Medical History: Past Medical History:  Diagnosis Date   Anxiety    Arthritis    Depression    Diabetes 1.5, managed as type 2 ([place])    Hypertension    Insomnia    Migraines    Reflux    Sinusitis     Family History: Family History  Problem Relation Age of Onset   Hodgkin's lymphoma Mother    Diabetes Mother    Thyroid disease Mother    Glaucoma Mother    Stroke Brother    Heart disease Brother     Social History   Socioeconomic History   Marital status: Married    Spouse name: Not on file   Number of children: Not on file   Years of education: Not on file   Highest education level: Not on file  Occupational History   Not on file  Tobacco Use   Smoking status: Former    Types: Cigarettes    Quit date: 05/24/2011    Years since quitting: 11.4   Smokeless tobacco: Never  Vaping Use   Vaping Use: Not on file  Substance and Sexual Activity   Alcohol use: No   Drug use: No   Sexual activity: Not on file  Other Topics Concern   Not on file  Social History Narrative   Not on file   Social Determinants of Health   Financial Resource Strain: Not on file  Food Insecurity: Not on file  Transportation Needs: Not on file  Physical Activity: Not on file  Stress: Not on file  Social Connections: Not on file  Intimate Partner Violence: Not on file      Review of Systems  Constitutional:  Positive for fatigue.  HENT:  Positive for congestion, ear pain, postnasal drip, rhinorrhea, sinus pressure, sinus pain and sore throat.   Respiratory: Negative.  Negative for cough, chest tightness,  shortness of breath and wheezing.   Neurological:  Positive for headaches.    Vital Signs: Resp 16   Ht 5' 8.5" (1.74 m)   Wt 235 lb (106.6 kg)   BMI 35.21 kg/m    Observation/Objective: She is alert and oriented and engages in conversation appropriately. No acute distress noted.     Assessment/Plan: 1. Sore throat Augmentin prescribed prophylactic.  - amoxicillin-clavulanate (AUGMENTIN) 875-125 MG tablet; Take 1 tablet by mouth 2 (two) times daily. Take with food.  Dispense: 20 tablet; Refill: 0  2. Exposure to Streptococcal pharyngitis  Augmentin prescribed.  - amoxicillin-clavulanate (AUGMENTIN) 875-125 MG tablet; Take 1 tablet by mouth 2 (two) times daily. Take with food.  Dispense: 20 tablet; Refill: 0   General Counseling: Sandy Salaam understanding of the findings of today's phone visit and agrees with plan of treatment. I have discussed any further diagnostic evaluation that may be needed or ordered today. We also reviewed her medications today. she has been encouraged to call the office with any questions or concerns that should arise related to todays visit.  Return if symptoms worsen or fail to improve.   No orders of the defined types were placed in this encounter.   Meds ordered this encounter  Medications   amoxicillin-clavulanate (AUGMENTIN) 875-125 MG tablet    Sig: Take 1 tablet by mouth 2 (two) times daily. Take with food.    Dispense:  20 tablet    Refill:  0    Time spent:10 Minutes Time spent with patient included reviewing progress notes, labs, imaging studies, and discussing plan for follow up.  [place] Controlled Substance Database was reviewed by me for overdose risk score (ORS) if appropriate.  This patient was seen by Jonetta Osgood, FNP-C in collaboration with Dr. Clayborn Bigness as a part of collaborative care agreement.  Orell Hurtado R. Valetta Fuller, MSN, FNP-C Internal medicine

## 2022-11-19 NOTE — Telephone Encounter (Signed)
Done

## 2022-11-19 NOTE — Telephone Encounter (Signed)
Mammogram order faxed to [facility]

## 2022-12-09 ENCOUNTER — Ambulatory Visit
Admission: RE | Admit: 2022-12-09 | Discharge: 2022-12-09 | Disposition: A | Discharge status: Home / Self Care | Payer: 59 | Source: Ambulatory Visit | Attending: Internal Medicine | Admitting: Internal Medicine

## 2022-12-09 DIAGNOSIS — Z1231 Encounter for screening mammogram for malignant neoplasm of breast: Secondary | ICD-10-CM

## 2022-12-12 ENCOUNTER — Other Ambulatory Visit: Payer: Self-pay | Admitting: *Deleted

## 2022-12-12 ENCOUNTER — Inpatient Hospital Stay
Admission: RE | Admit: 2022-12-12 | Discharge: 2022-12-12 | Disposition: A | Discharge status: Home / Self Care | Payer: Self-pay | Source: Ambulatory Visit | Attending: *Deleted | Admitting: *Deleted

## 2022-12-12 DIAGNOSIS — Z1231 Encounter for screening mammogram for malignant neoplasm of breast: Secondary | ICD-10-CM

## 2022-12-12 DIAGNOSIS — M5412 Radiculopathy, cervical region: Secondary | ICD-10-CM | POA: Diagnosis not present

## 2022-12-12 DIAGNOSIS — M7542 Impingement syndrome of left shoulder: Secondary | ICD-10-CM | POA: Diagnosis not present

## 2022-12-17 IMAGING — RF DG UGI W/ HIGH DENSITY W/O KUB
14 of 17 series · 14 of 24 positions shown · non-contrast
Comparison: Upper GI 11/01/2007

CLINICAL DATA: Dysphagia, reflux

EXAM:
UPPER GI SERIES WITH HIGH DENSITY WITHOUT KUB
TECHNIQUE: Combined double and single contrast examination was performed using
effervescent crystals, high-density barium and thin liquid barium.
FLUOROSCOPY TIME:  Radiation Exposure Index (as provided by the
fluoroscopic device): 66 mGy
Fluoroscopy Time:  3.2 minutes
Number of Acquired Images:  9

[Series 1: cp_standard · 0.25mm/px · 1 of 62 frames shown (1 of 13)]
[frame 10/62]
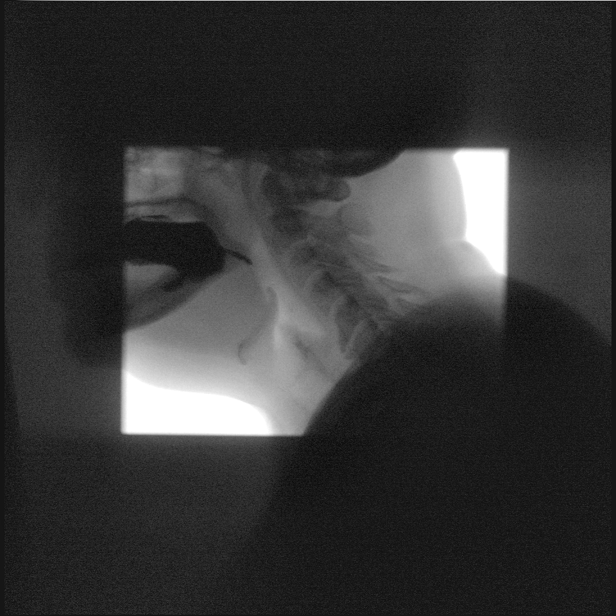

[Series 2: cp_standard · 0.25mm/px · 1 of 69 frames shown (2 of 13)]
[frame 11/69]
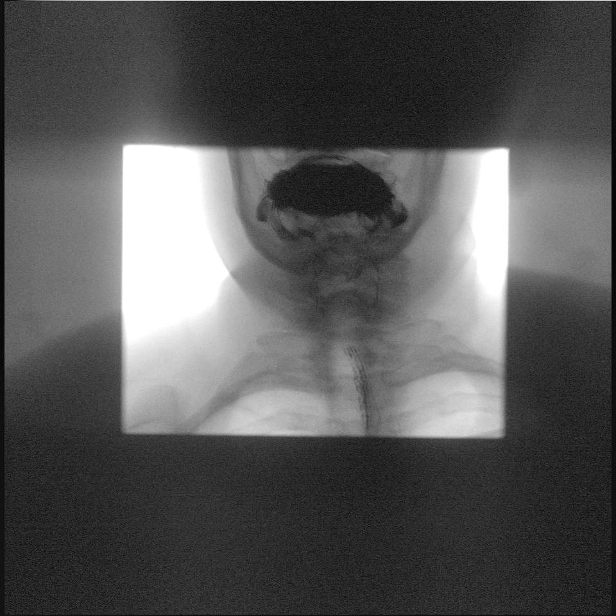

[Series 3: cp_standard · 0.25mm/px · 1 of 85 frames shown (3 of 13)]
[frame 73/85]
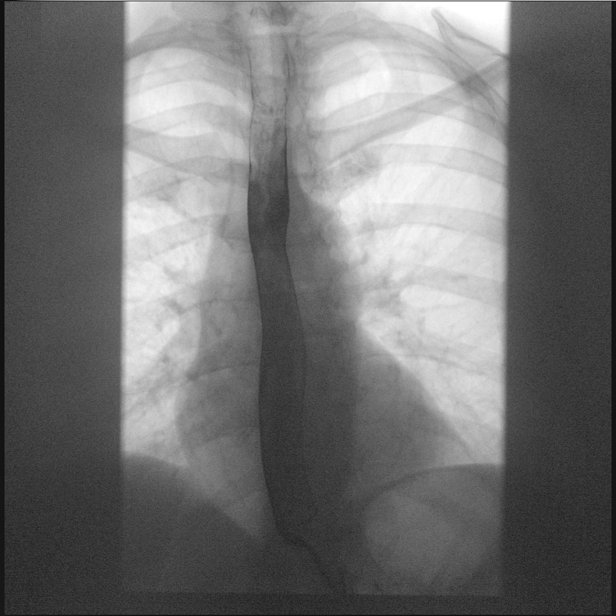

[Series 6: cp_standard · 0.25mm/px · 1 of 115 frames shown (4 of 13)]
[frame 58/115]
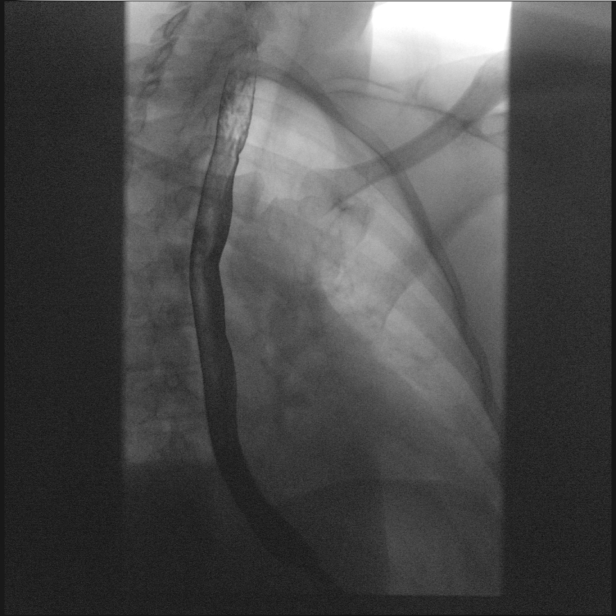

[Series 8: fluoro_barium 2fps_bw · 0.17mm/px · 1 of 1 slices shown]
[im 1/1]
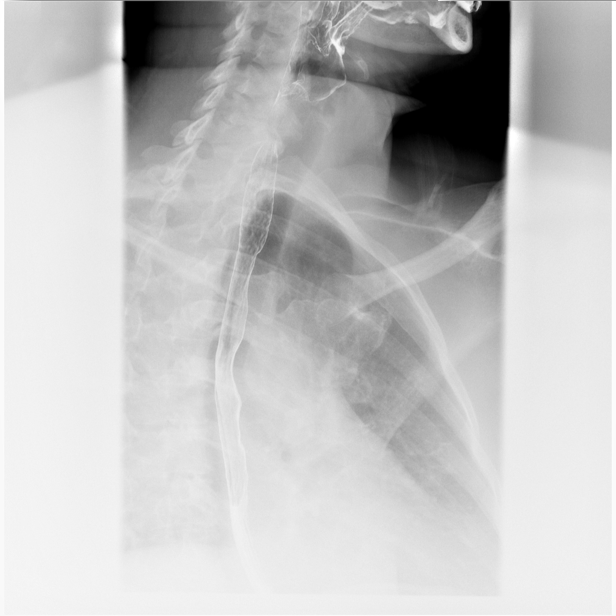

[Series 11: cp_standard · 0.25mm/px · 1 of 197 frames shown (5 of 13)]
[frame 99/197]
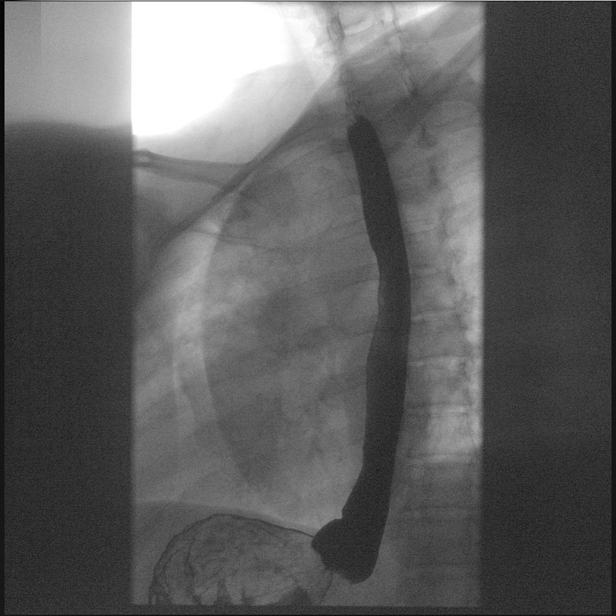

[Series 13: cp_standard · 0.25mm/px · 1 of 93 frames shown (6 of 13)]
[frame 47/93]
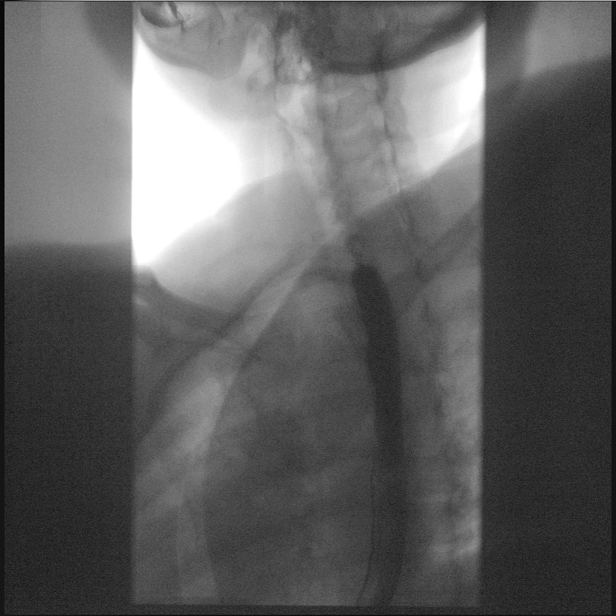

[Series 14: cp_standard · 0.25mm/px · 1 of 70 frames shown (7 of 13)]
[frame 36/70]
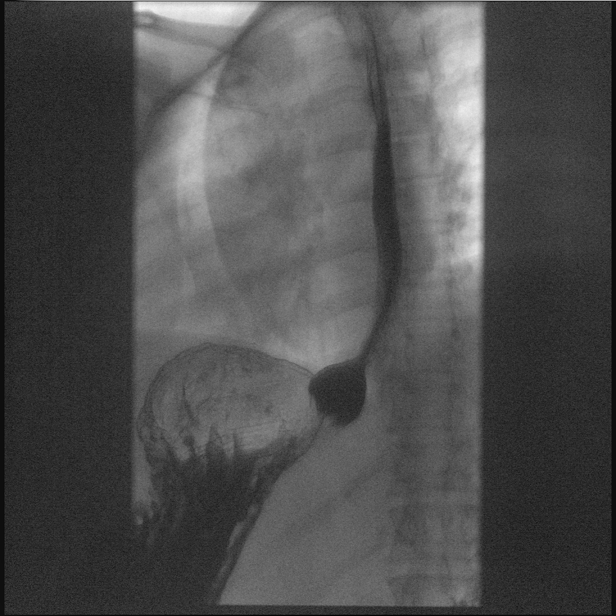

[Series 16: cp_standard · 0.25mm/px · 1 of 56 frames shown (8 of 13)]
[frame 9/56]
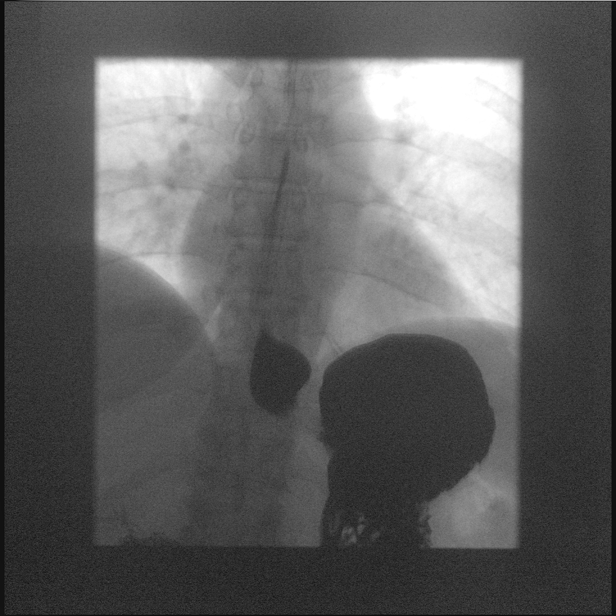

[Series 17: cp_standard · 0.25mm/px · 1 of 95 frames shown (9 of 13)]
[frame 48/95]
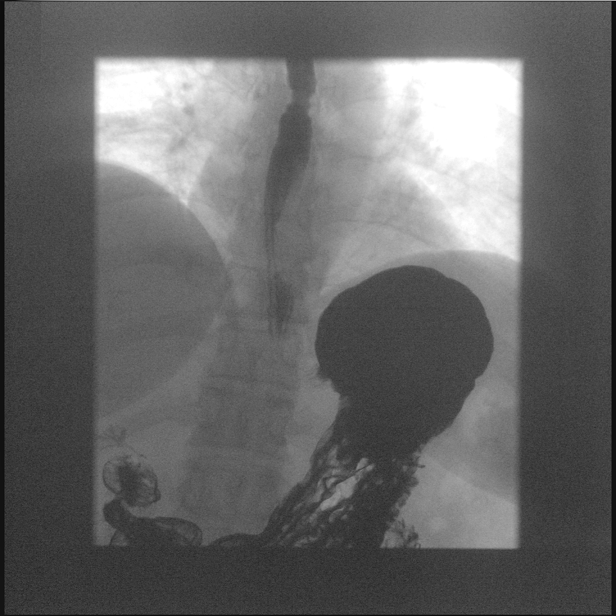

[Series 18: cp_standard · 0.25mm/px · 1 of 66 frames shown (10 of 13)]
[frame 57/66]
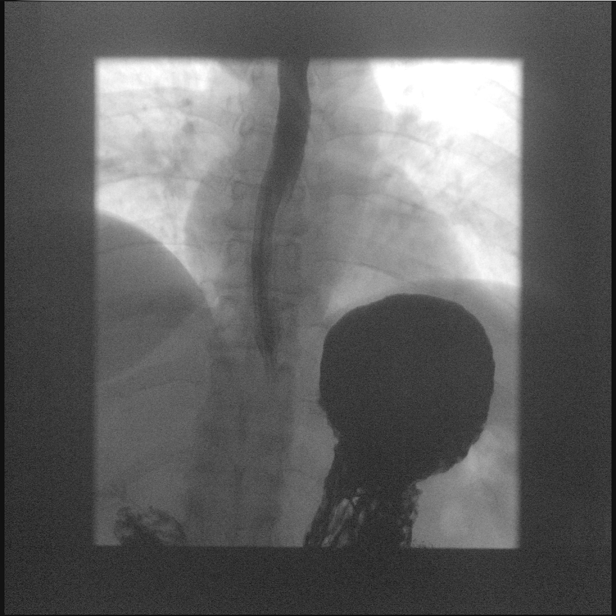

[Series 19: cp_standard · 0.25mm/px · 1 of 27 frames shown (11 of 13)]
[frame 14/27]
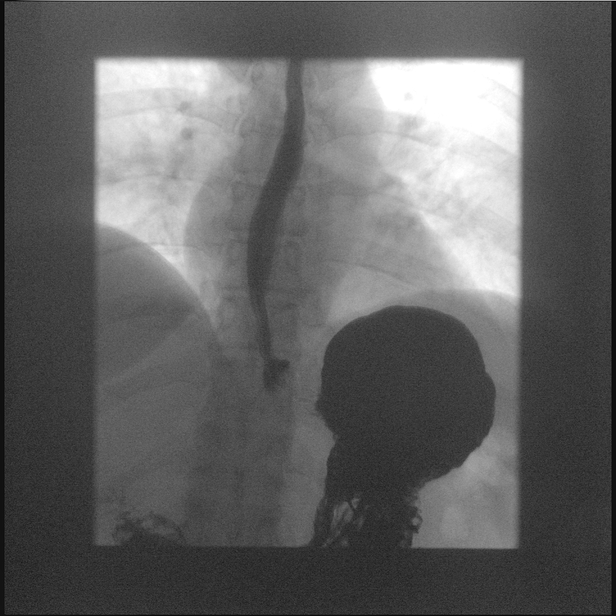

[Series 21: cp_standard · 0.25mm/px · 1 of 18 frames shown (12 of 13)]
[frame 10/18]
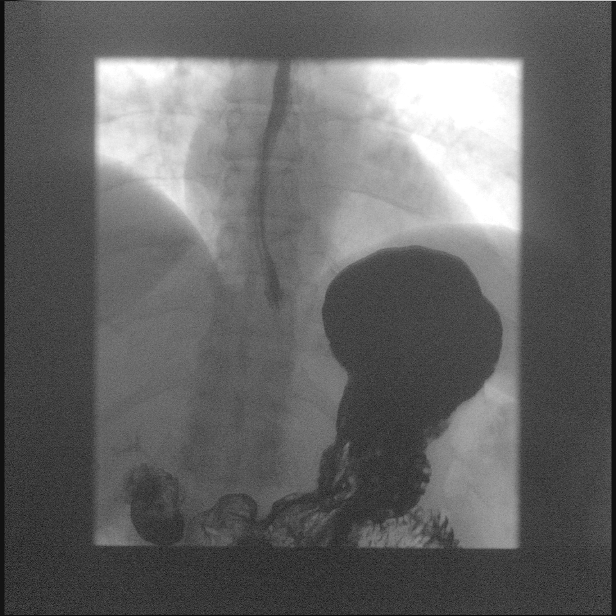

[Series 22: cp_standard · 0.25mm/px · 1 of 96 frames shown (13 of 13)]
[frame 82/96]
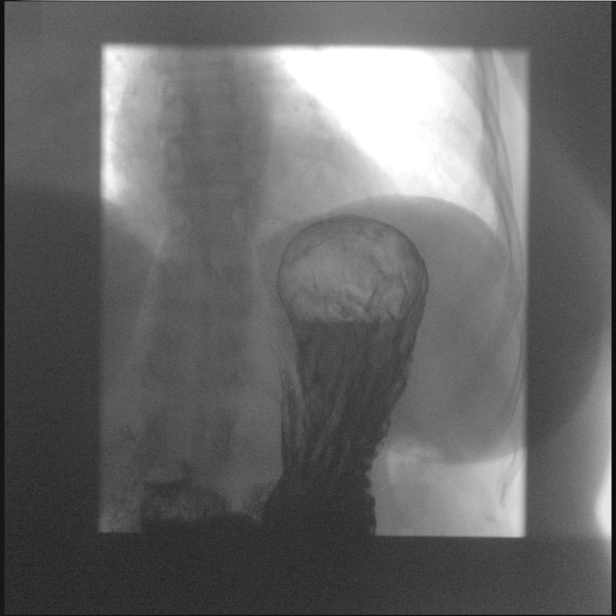

[14 of 24 positions shown; findings below may reference images not displayed]

FINDINGS: Esophagus: The esophagus demonstrated normal distensibility. There
are no strictures or mucosal lesions identified.

Esophageal motility: Normal.

Gastroesophageal reflux: There was mild gastroesophageal reflux

Ingested 13mm barium tablet: Passed normally.

Stomach: Normal appearance.

Hiatal Hernia: Small hiatal hernia.

Gastric emptying: Normal.

Duodenum: Normal appearance.

Other:  None.
IMPRESSION: Small hiatal hernia with mild gastroesophageal reflux. Otherwise,
normal upper GI.

## 2022-12-18 ENCOUNTER — Telehealth: Payer: Self-pay

## 2022-12-18 NOTE — Telephone Encounter (Signed)
Completed P.A. for patient's Pantoprazole.

## 2022-12-22 ENCOUNTER — Other Ambulatory Visit: Payer: Self-pay

## 2022-12-22 DIAGNOSIS — Z79899 Other long term (current) drug therapy: Secondary | ICD-10-CM

## 2022-12-22 MED ORDER — PANTOPRAZOLE SODIUM 40 MG PO TBEC: 40.0000 mg | DELAYED_RELEASE_TABLET | Freq: Every day | ORAL | 5 refills | Status: DC

## 2023-01-26 ENCOUNTER — Encounter: Payer: Self-pay | Admitting: Nurse Practitioner

## 2023-01-26 ENCOUNTER — Ambulatory Visit: Payer: 59 | Admitting: Nurse Practitioner

## 2023-01-26 ENCOUNTER — Other Ambulatory Visit: Payer: Self-pay | Admitting: Nurse Practitioner

## 2023-01-26 VITALS — BP 145/85 | HR 92 | Temp 97.2°F | Resp 16 | Ht 68.5 in | Wt 243.4 lb

## 2023-01-26 DIAGNOSIS — Z79899 Other long term (current) drug therapy: Secondary | ICD-10-CM

## 2023-01-26 DIAGNOSIS — E1165 Type 2 diabetes mellitus with hyperglycemia: Secondary | ICD-10-CM

## 2023-01-26 DIAGNOSIS — F5101 Primary insomnia: Secondary | ICD-10-CM | POA: Diagnosis not present

## 2023-01-26 DIAGNOSIS — I10 Essential (primary) hypertension: Secondary | ICD-10-CM | POA: Diagnosis not present

## 2023-01-26 LAB — POCT GLYCOSYLATED HEMOGLOBIN (HGB A1C): Hemoglobin A1C: 8.4 % — AB (ref 4.0–5.6)

## 2023-01-26 MED ORDER — ZOLPIDEM TARTRATE 10 MG PO TABS: ORAL_TABLET | ORAL | 2 refills | Status: DC

## 2023-01-26 MED ORDER — TRAMADOL HCL 50 MG PO TABS: 50.0000 mg | ORAL_TABLET | Freq: Two times a day (BID) | ORAL | 2 refills | Status: DC | PRN

## 2023-01-26 MED ORDER — ESTRADIOL 0.5 MG PO TABS: 0.5000 mg | ORAL_TABLET | Freq: Every day | ORAL | 5 refills | Status: DC

## 2023-01-26 MED ORDER — OZEMPIC (0.25 OR 0.5 MG/DOSE) 2 MG/3ML ~~LOC~~ SOPN: 0.5000 mg | PEN_INJECTOR | SUBCUTANEOUS | 1 refills | Status: DC

## 2023-01-26 MED ORDER — LOSARTAN POTASSIUM 100 MG PO TABS: 100.0000 mg | ORAL_TABLET | Freq: Every day | ORAL | 5 refills | Status: DC

## 2023-01-26 NOTE — Progress Notes (Signed)
[facility] [address] [place], [place] [address]  Internal MEDICINE  Office Visit Note  Patient Name: Valerie Ramos  [id number]  [id number]  Date of Service: 01/26/2023  Chief Complaint  Patient presents with   Diabetes   Depression   Hypertension   Follow-up    HPI Valerie Ramos presents for a follow-up visit for diabetes, hypertension, insomnia.  Caregiver for her husband -- walking with him and encouraging movement, thinks he is depressed.  Bad cold a few weeks ago. -- wants to check throat  Blood pressure is high, recheck BP and it improved. On BP meds Diabetes -- slightly increased to 8.4 from 8.1 in January.  Insomnia -- takes [place], due for refills.     Current Medication: Outpatient Encounter Medications as of 01/26/2023  Medication Sig   acetaminophen (TYLENOL) 325 MG tablet Take 2 tablets (650 mg total) by mouth every 6 (six) hours as needed for mild pain (or Fever >/= 101).   ALPRAZolam (XANAX) 0.5 MG tablet TAKE 1 TAB BY MOUTH TWICE DAILY AS NEEDED FOR ANXIETY ATTACKS (DO NOT TAKE DAILY DUE TO DEPENDENCE)   amoxicillin-clavulanate (AUGMENTIN) 875-125 MG tablet Take 1 tablet by mouth 2 (two) times daily. Take with food.   atenolol (TENORMIN) 25 MG tablet TAKE 1 TABLET BY MOUTH AT BEDTIME FOR BLOOD PRESSURE   fluticasone (FLONASE) 50 MCG/ACT nasal spray SPRAY 2 SPRAYS INTO EACH NOSTRIL EVERY DAY   gabapentin (NEURONTIN) 100 MG capsule TAKE 1 CAPSULE BY MOUTH EVERY MORNING AND 1 CAPSULES AT NIGHT   glimepiride (AMARYL) 1 MG tablet Take 1 tablet by mouth daily with breakfast and dinner.   hydrochlorothiazide (HYDRODIURIL) 25 MG tablet Take 1 tablet (25 mg total) by mouth daily.   Lancets (ONETOUCH DELICA PLUS LANCET30G) MISC 1 EACH BY DOES NOT APPLY ROUTE DAILY. USE AS DIRECTED ONCE DAILY DIAG E11.65   metoCLOPramide (REGLAN) 10 MG tablet Take 1 tablet (10 mg total) by mouth 3 (three) times daily before meals.   ONETOUCH ULTRA test strip FOR ONCE DAILY TESTING  DX. E11.65   oxybutynin (DITROPAN-XL) 10 MG 24 hr tablet TAKE 1 TABLET BY MOUTH EVERYDAY AT BEDTIME   pantoprazole (PROTONIX) 40 MG tablet Take 1 tablet (40 mg total) by mouth daily.   PNEUMOCOCCAL 20-VAL CONJ VACC IM Inject into the muscle.   rosuvastatin (CRESTOR) 5 MG tablet Take 1 tablet (5 mg total) by mouth daily.   Semaglutide,0.25 or 0.5MG /DOS, (OZEMPIC, 0.25 OR 0.5 MG/DOSE,) 2 MG/3ML SOPN Inject 0.5 mg into the skin once a week.   SUMAtriptan (IMITREX) 50 MG tablet TAKE 1 TABLET EVERY 2 HOURS AS NEEDED FOR MIGRAINE. MAY REPEAT IN 2 HOURS IF HEADACHE PERSISTS OR RECURS. LIMIT TO 2 DOSES PER 24 HOURS   tiZANidine (ZANAFLEX) 2 MG tablet Take 1 tablet (2 mg total) by mouth 2 (two) times daily as needed for muscle spasms.   venlafaxine XR (EFFEXOR-XR) 37.5 MG 24 hr capsule TAKE 1 CAPSULE BY MOUTH DAILY WITH BREAKFAST.   [DISCONTINUED] estradiol (ESTRACE) 0.5 MG tablet Take 1 tablet (0.5 mg total) by mouth daily.   [DISCONTINUED] losartan (COZAAR) 100 MG tablet Take 1 tablet (100 mg total) by mouth daily.   [DISCONTINUED] metFORMIN (GLUCOPHAGE) 500 MG tablet Take 1 tablet (500 mg total) by mouth 2 (two) times daily with a meal.   [DISCONTINUED] traMADol (ULTRAM) 50 MG tablet Take 1 tablet (50 mg total) by mouth every 12 (twelve) hours as needed for moderate pain or severe pain.   [DISCONTINUED]  zolpidem (AMBIEN) 10 MG tablet TAKE 1 TABLET BY MOUTH EVERYDAY AT BEDTIME   estradiol (ESTRACE) 0.5 MG tablet Take 1 tablet (0.5 mg total) by mouth daily.   losartan (COZAAR) 100 MG tablet Take 1 tablet (100 mg total) by mouth daily.   traMADol (ULTRAM) 50 MG tablet Take 1 tablet (50 mg total) by mouth every 12 (twelve) hours as needed for moderate pain or severe pain.   zolpidem (AMBIEN) 10 MG tablet TAKE 1 TABLET BY MOUTH EVERYDAY AT BEDTIME   No facility-administered encounter medications on file as of 01/26/2023.    Surgical History: Past Surgical History:  Procedure Laterality Date   ABDOMINAL  HYSTERECTOMY     CESAREAN SECTION  1989   COLONOSCOPY     COLONOSCOPY N/A 11/28/2021   Procedure: COLONOSCOPY;  Surgeon: Toney Reil, MD;  [facility] ENDOSCOPY;  Service: Gastroenterology;  Laterality: N/A;   CYSTECTOMY  2000   Ovarian   ESOPHAGOGASTRODUODENOSCOPY     OOPHORECTOMY     OVARIAN CYST SURGERY  2006   RECTOVAGINAL FISTULA CLOSURE  1980    Medical History: Past Medical History:  Diagnosis Date   Anxiety    Arthritis    Depression    Diabetes 1.5, managed as type 2 (HCC)    Hypertension    Insomnia    Migraines    Reflux    Sinusitis     Family History: Family History  Problem Relation Age of Onset   Hodgkin's lymphoma Mother    Diabetes Mother    Thyroid disease Mother    Glaucoma Mother    Stroke Brother    Heart disease Brother     Social History   Socioeconomic History   Marital status: Married    Spouse name: Not on file   Number of children: Not on file   Years of education: Not on file   Highest education level: Not on file  Occupational History   Not on file  Tobacco Use   Smoking status: Former    Types: Cigarettes    Quit date: 05/24/2011    Years since quitting: 11.6   Smokeless tobacco: Never  Vaping Use   Vaping Use: Not on file  Substance and Sexual Activity   Alcohol use: No   Drug use: No   Sexual activity: Not on file  Other Topics Concern   Not on file  Social History Narrative   Not on file   Social Determinants of Health   Financial Resource Strain: Not on file  Food Insecurity: Not on file  Transportation Needs: Not on file  Physical Activity: Not on file  Stress: Not on file  Social Connections: Not on file  Intimate Partner Violence: Not on file      Review of Systems  Constitutional:  Negative for chills, fatigue and unexpected weight change.  HENT:  Positive for postnasal drip. Negative for congestion, rhinorrhea, sneezing and sore throat.   Eyes:  Negative for redness.  Respiratory:   Negative for cough, chest tightness and shortness of breath.   Cardiovascular:  Negative for chest pain and palpitations.  Gastrointestinal:  Negative for abdominal pain, constipation, diarrhea, nausea and vomiting.  Genitourinary:  Negative for dysuria and frequency.  Musculoskeletal:  Negative for arthralgias, back pain, joint swelling and neck pain.  Skin:  Negative for rash.  Neurological: Negative.  Negative for tremors and numbness.  Hematological:  Negative for adenopathy. Does not bruise/bleed easily.  Psychiatric/Behavioral:  Positive for behavioral problems (Depression, improving  some) and sleep disturbance (sleeping better). Negative for self-injury and suicidal ideas. The patient is nervous/anxious (improving).     Vital Signs: BP (!) 166/83   Pulse 92   Temp (!) 97.2 F (36.2 C)   Resp 16   Ht 5' 8.5" (1.74 m)   Wt 243 lb 6.4 oz (110.4 kg)   SpO2 99%   BMI 36.47 kg/m    Physical Exam Vitals reviewed.  Constitutional:      General: She is not in acute distress.    Appearance: Normal appearance. She is obese. She is not ill-appearing.  HENT:     Head: Normocephalic and atraumatic.  Eyes:     Pupils: Pupils are equal, round, and reactive to light.  Cardiovascular:     Rate and Rhythm: Normal rate and regular rhythm.  Pulmonary:     Effort: Pulmonary effort is normal. No respiratory distress.  Neurological:     Mental Status: She is alert and oriented to person, place, and time.  Psychiatric:        Mood and Affect: Mood normal.        Behavior: Behavior normal.        Assessment/Plan: 1. Type 2 diabetes mellitus with hyperglycemia, without long-term current use of insulin (HCC) A1c is elevated, no changes to medications, continue as prescribed. Encouraged compliance with diet and lifestyle modifications as instructed.  - POCT glycosylated hemoglobin (Hb A1C)  2. Essential (primary) hypertension Improved when rechecked, stable, continue medications as  prescribed.   3. Encounter for medication review Medication list reviewed, updated, and refills ordered.  - traMADol (ULTRAM) 50 MG tablet; Take 1 tablet (50 mg total) by mouth every 12 (twelve) hours as needed for moderate pain or severe pain.  Dispense: 60 tablet; Refill: 2 - estradiol (ESTRACE) 0.5 MG tablet; Take 1 tablet (0.5 mg total) by mouth daily.  Dispense: 30 tablet; Refill: 5 - losartan (COZAAR) 100 MG tablet; Take 1 tablet (100 mg total) by mouth daily.  Dispense: 30 tablet; Refill: 5  4. Primary insomnia Continue ambien as prescribed  - zolpidem (AMBIEN) 10 MG tablet; TAKE 1 TABLET BY MOUTH EVERYDAY AT BEDTIME  Dispense: 30 tablet; Refill: 2   General Counseling: Valerie Ramos verbalizes understanding of the findings of todays visit and agrees with plan of treatment. I have discussed any further diagnostic evaluation that may be needed or ordered today. We also reviewed her medications today. she has been encouraged to call the office with any questions or concerns that should arise related to todays visit.    Orders Placed This Encounter  Procedures   POCT glycosylated hemoglobin (Hb A1C)    Meds ordered this encounter  Medications   Semaglutide,0.25 or 0.5MG /DOS, (OZEMPIC, 0.25 OR 0.5 MG/DOSE,) 2 MG/3ML SOPN    Sig: Inject 0.5 mg into the skin once a week.    Dispense:  3 mL    Refill:  1   zolpidem (AMBIEN) 10 MG tablet    Sig: TAKE 1 TABLET BY MOUTH EVERYDAY AT BEDTIME    Dispense:  30 tablet    Refill:  2    Please send 30 day supply, patient takes every day   traMADol (ULTRAM) 50 MG tablet    Sig: Take 1 tablet (50 mg total) by mouth every 12 (twelve) hours as needed for moderate pain or severe pain.    Dispense:  60 tablet    Refill:  2   estradiol (ESTRACE) 0.5 MG tablet    Sig: Take 1  tablet (0.5 mg total) by mouth daily.    Dispense:  30 tablet    Refill:  5    For future refills   losartan (COZAAR) 100 MG tablet    Sig: Take 1 tablet (100 mg total) by mouth  daily.    Dispense:  30 tablet    Refill:  5    Return in about 6 weeks (around 03/09/2023) for F/U, eval new med, Valerie Ramos.   Total time spent:30 Minutes Time spent includes review of chart, medications, test results, and follow up plan with the patient.   [place] Controlled Substance Database was reviewed by me.  This patient was seen by Sallyanne Kuster, FNP-C in collaboration with Dr. Beverely Risen as a part of collaborative care agreement.   Valerie Manuele R. Tedd Sias, MSN, FNP-C Internal medicine

## 2023-01-26 NOTE — Telephone Encounter (Signed)
Please change alternative

## 2023-02-04 ENCOUNTER — Other Ambulatory Visit: Payer: Self-pay | Admitting: Nurse Practitioner

## 2023-02-04 DIAGNOSIS — E1142 Type 2 diabetes mellitus with diabetic polyneuropathy: Secondary | ICD-10-CM

## 2023-02-07 ENCOUNTER — Encounter: Payer: Self-pay | Admitting: Nurse Practitioner

## 2023-02-10 ENCOUNTER — Telehealth: Payer: Self-pay | Admitting: Nurse Practitioner

## 2023-02-10 NOTE — Telephone Encounter (Signed)
s/w patient to schedule diabetic eye exam here at office. Patient stated they have done at regular eye doctor-[name]

## 2023-02-10 NOTE — Telephone Encounter (Signed)
Lvm to schedule diabetic eye exam here at office-[name] 

## 2023-02-15 ENCOUNTER — Other Ambulatory Visit: Payer: Self-pay | Admitting: Nurse Practitioner

## 2023-02-15 DIAGNOSIS — F411 Generalized anxiety disorder: Secondary | ICD-10-CM

## 2023-03-08 ENCOUNTER — Other Ambulatory Visit: Payer: Self-pay | Admitting: Nurse Practitioner

## 2023-03-08 DIAGNOSIS — F411 Generalized anxiety disorder: Secondary | ICD-10-CM

## 2023-03-09 ENCOUNTER — Encounter: Payer: Self-pay | Admitting: Nurse Practitioner

## 2023-03-09 ENCOUNTER — Ambulatory Visit: Payer: 59 | Admitting: Nurse Practitioner

## 2023-03-09 VITALS — BP 130/75 | HR 96 | Temp 97.3°F | Resp 16 | Ht 68.5 in | Wt 237.0 lb

## 2023-03-09 DIAGNOSIS — F411 Generalized anxiety disorder: Secondary | ICD-10-CM | POA: Diagnosis not present

## 2023-03-09 DIAGNOSIS — F331 Major depressive disorder, recurrent, moderate: Secondary | ICD-10-CM | POA: Diagnosis not present

## 2023-03-09 DIAGNOSIS — F5101 Primary insomnia: Secondary | ICD-10-CM

## 2023-03-09 DIAGNOSIS — E1165 Type 2 diabetes mellitus with hyperglycemia: Secondary | ICD-10-CM | POA: Diagnosis not present

## 2023-03-09 DIAGNOSIS — I10 Essential (primary) hypertension: Secondary | ICD-10-CM

## 2023-03-09 MED ORDER — ZOLPIDEM TARTRATE 10 MG PO TABS: ORAL_TABLET | ORAL | 2 refills | Status: DC

## 2023-03-09 MED ORDER — GLIMEPIRIDE 1 MG PO TABS: ORAL_TABLET | ORAL | 5 refills | Status: DC

## 2023-03-09 MED ORDER — DULAGLUTIDE 1.5 MG/0.5ML ~~LOC~~ SOAJ: 1.5000 mg | SUBCUTANEOUS | 5 refills | Status: DC

## 2023-03-09 MED ORDER — VENLAFAXINE HCL ER 37.5 MG PO CP24: 37.5000 mg | ORAL_CAPSULE | Freq: Every day | ORAL | 1 refills | Status: DC

## 2023-03-09 NOTE — Progress Notes (Signed)
[facility] [address] [place], [place] [address]  Internal MEDICINE  Office Visit Note  Patient Name: Valerie Ramos  [id number]  [id number]  Date of Service: 03/09/2023  Chief Complaint  Patient presents with   Depression   Diabetes   Hypertension   Follow-up    6 weeks f/u eval new med.     HPI Kimery presents for a follow-up visit for diabetes, hypertension, depression and weight loss Diabetes -- tried to get ozempic but her insurance denied it, but they did approve trulicity.  Hypertension -- controlled with current medications  Depression -- taking venlafaxine Weight loss -- lost 6 lbs since her last visit.     Current Medication: Outpatient Encounter Medications as of 03/09/2023  Medication Sig   acetaminophen (TYLENOL) 325 MG tablet Take 2 tablets (650 mg total) by mouth every 6 (six) hours as needed for mild pain (or Fever >/= 101).   ALPRAZolam (XANAX) 0.5 MG tablet TAKE 1 TAB BY MOUTH TWICE DAILY AS NEEDED FOR ANXIETY ATTACKS (DO NOT TAKE DAILY DUE TO DEPENDENCE)   amoxicillin-clavulanate (AUGMENTIN) 875-125 MG tablet Take 1 tablet by mouth 2 (two) times daily. Take with food.   atenolol (TENORMIN) 25 MG tablet TAKE 1 TABLET BY MOUTH AT BEDTIME FOR BLOOD PRESSURE   Dulaglutide 1.5 MG/0.5ML SOPN Inject 1.5 mg into the skin once a week.   estradiol (ESTRACE) 0.5 MG tablet Take 1 tablet (0.5 mg total) by mouth daily.   fluticasone (FLONASE) 50 MCG/ACT nasal spray SPRAY 2 SPRAYS INTO EACH NOSTRIL EVERY DAY   gabapentin (NEURONTIN) 100 MG capsule TAKE 1 CAPSULE BY MOUTH EVERY MORNING AND 1 CAPSULES AT NIGHT   hydrochlorothiazide (HYDRODIURIL) 25 MG tablet Take 1 tablet (25 mg total) by mouth daily.   Lancets (ONETOUCH DELICA PLUS LANCET30G) MISC 1 EACH BY DOES NOT APPLY ROUTE DAILY. USE AS DIRECTED ONCE DAILY DIAG E11.65   losartan (COZAAR) 100 MG tablet Take 1 tablet (100 mg total) by mouth daily.   metoCLOPramide (REGLAN) 10 MG tablet Take 1 tablet (10 mg  total) by mouth 3 (three) times daily before meals.   ONETOUCH ULTRA test strip FOR ONCE DAILY TESTING DX. E11.65   oxybutynin (DITROPAN-XL) 10 MG 24 hr tablet TAKE 1 TABLET BY MOUTH EVERYDAY AT BEDTIME   pantoprazole (PROTONIX) 40 MG tablet Take 1 tablet (40 mg total) by mouth daily.   PNEUMOCOCCAL 20-VAL CONJ VACC IM Inject into the muscle.   rosuvastatin (CRESTOR) 5 MG tablet Take 1 tablet (5 mg total) by mouth daily.   SUMAtriptan (IMITREX) 50 MG tablet TAKE 1 TABLET EVERY 2 HOURS AS NEEDED FOR MIGRAINE. MAY REPEAT IN 2 HOURS IF HEADACHE PERSISTS OR RECURS. LIMIT TO 2 DOSES PER 24 HOURS   tiZANidine (ZANAFLEX) 2 MG tablet Take 1 tablet (2 mg total) by mouth 2 (two) times daily as needed for muscle spasms.   traMADol (ULTRAM) 50 MG tablet Take 1 tablet (50 mg total) by mouth every 12 (twelve) hours as needed for moderate pain or severe pain.   [DISCONTINUED] Dulaglutide (TRULICITY) 0.75 MG/0.5ML SOPN Inject 0.75 mg into the skin once a week.   [DISCONTINUED] glimepiride (AMARYL) 1 MG tablet Take 1 tablet by mouth daily with breakfast and dinner.   [DISCONTINUED] venlafaxine XR (EFFEXOR-XR) 37.5 MG 24 hr capsule TAKE 1 CAPSULE BY MOUTH DAILY WITH BREAKFAST.   [DISCONTINUED] zolpidem (AMBIEN) 10 MG tablet TAKE 1 TABLET BY MOUTH EVERYDAY AT BEDTIME   glimepiride (AMARYL) 1 MG tablet Take 1  tablet by mouth daily with breakfast and dinner.   venlafaxine XR (EFFEXOR-XR) 37.5 MG 24 hr capsule Take 1 capsule (37.5 mg total) by mouth daily with breakfast.   zolpidem (AMBIEN) 10 MG tablet TAKE 1 TABLET BY MOUTH EVERYDAY AT BEDTIME   No facility-administered encounter medications on file as of 03/09/2023.    Surgical History: Past Surgical History:  Procedure Laterality Date   ABDOMINAL HYSTERECTOMY     CESAREAN SECTION  1989   COLONOSCOPY     COLONOSCOPY N/A 11/28/2021   Procedure: COLONOSCOPY;  Surgeon: Toney Reil, MD;  Location: [facility] ENDOSCOPY;  Service: Gastroenterology;  Laterality:  N/A;   CYSTECTOMY  2000   Ovarian   ESOPHAGOGASTRODUODENOSCOPY     OOPHORECTOMY     OVARIAN CYST SURGERY  2006   RECTOVAGINAL FISTULA CLOSURE  1980    Medical History: Past Medical History:  Diagnosis Date   Anxiety    Arthritis    Depression    Diabetes 1.5, managed as type 2 (HCC)    Hypertension    Insomnia    Migraines    Reflux    Sinusitis     Family History: Family History  Problem Relation Age of Onset   Hodgkin's lymphoma Mother    Diabetes Mother    Thyroid disease Mother    Glaucoma Mother    Stroke Brother    Heart disease Brother     Social History   Socioeconomic History   Marital status: Married    Spouse name: Not on file   Number of children: Not on file   Years of education: Not on file   Highest education level: Not on file  Occupational History   Not on file  Tobacco Use   Smoking status: Former    Types: Cigarettes    Quit date: 05/24/2011    Years since quitting: 11.8   Smokeless tobacco: Never  Vaping Use   Vaping Use: Not on file  Substance and Sexual Activity   Alcohol use: No   Drug use: No   Sexual activity: Not on file  Other Topics Concern   Not on file  Social History Narrative   Not on file   Social Determinants of Health   Financial Resource Strain: Not on file  Food Insecurity: Not on file  Transportation Needs: Not on file  Physical Activity: Not on file  Stress: Not on file  Social Connections: Not on file  Intimate Partner Violence: Not on file      Review of Systems  Constitutional:  Negative for chills, fatigue and unexpected weight change.  HENT:  Positive for postnasal drip. Negative for congestion, rhinorrhea, sneezing and sore throat.   Eyes:  Negative for redness.  Respiratory:  Negative for cough, chest tightness and shortness of breath.   Cardiovascular:  Negative for chest pain and palpitations.  Gastrointestinal:  Negative for abdominal pain, constipation, diarrhea, nausea and vomiting.   Genitourinary:  Negative for dysuria and frequency.  Musculoskeletal:  Negative for arthralgias, back pain, joint swelling and neck pain.  Skin:  Negative for rash.  Neurological: Negative.  Negative for tremors and numbness.  Hematological:  Negative for adenopathy. Does not bruise/bleed easily.  Psychiatric/Behavioral:  Positive for behavioral problems (Depression, improving some) and sleep disturbance (sleeping better). Negative for self-injury and suicidal ideas. The patient is nervous/anxious (improving).     Vital Signs: BP 130/75 Comment: 140/80  Pulse 96   Temp (!) 97.3 F (36.3 C)   Resp 16  Ht 5' 8.5" (1.74 m)   Wt 237 lb (107.5 kg)   SpO2 97%   BMI 35.51 kg/m    Physical Exam Vitals reviewed.  Constitutional:      General: She is not in acute distress.    Appearance: Normal appearance. She is obese. She is not ill-appearing.  HENT:     Head: Normocephalic and atraumatic.  Eyes:     Pupils: Pupils are equal, round, and reactive to light.  Cardiovascular:     Rate and Rhythm: Normal rate and regular rhythm.  Pulmonary:     Effort: Pulmonary effort is normal. No respiratory distress.  Neurological:     Mental Status: She is alert and oriented to person, place, and time.  Psychiatric:        Mood and Affect: Mood normal.        Behavior: Behavior normal.        Assessment/Plan: 1. Type 2 diabetes mellitus with hyperglycemia, without long-term current use of insulin (HCC) Increase trulicity dose and continue glimepiride as prescribed.  - Dulaglutide 1.5 MG/0.5ML SOPN; Inject 1.5 mg into the skin once a week.  Dispense: 2 mL; Refill: 5 - glimepiride (AMARYL) 1 MG tablet; Take 1 tablet by mouth daily with breakfast and dinner.  Dispense: 60 tablet; Refill: 5  2. Essential (primary) hypertension Stable, continue atenolol, HCTZ, and losartan as prescribed.   3. Primary insomnia Continue ambien as prescribed.  - zolpidem (AMBIEN) 10 MG tablet; TAKE 1 TABLET  BY MOUTH EVERYDAY AT BEDTIME  Dispense: 30 tablet; Refill: 2  4. Moderate episode of recurrent major depressive disorder (HCC) Continue venlafaxine as prescribed.  - venlafaxine XR (EFFEXOR-XR) 37.5 MG 24 hr capsule; Take 1 capsule (37.5 mg total) by mouth daily with breakfast.  Dispense: 90 capsule; Refill: 1  5. Generalized anxiety disorder Continue venlafaxine as prescribed.  - venlafaxine XR (EFFEXOR-XR) 37.5 MG 24 hr capsule; Take 1 capsule (37.5 mg total) by mouth daily with breakfast.  Dispense: 90 capsule; Refill: 1   General Counseling: Leiliana verbalizes understanding of the findings of todays visit and agrees with plan of treatment. I have discussed any further diagnostic evaluation that may be needed or ordered today. We also reviewed her medications today. she has been encouraged to call the office with any questions or concerns that should arise related to todays visit.    No orders of the defined types were placed in this encounter.   Meds ordered this encounter  Medications   Dulaglutide 1.5 MG/0.5ML SOPN    Sig: Inject 1.5 mg into the skin once a week.    Dispense:  2 mL    Refill:  5    Note increased dose, please fill asap. Discontinue 0.75 mg dose   glimepiride (AMARYL) 1 MG tablet    Sig: Take 1 tablet by mouth daily with breakfast and dinner.    Dispense:  60 tablet    Refill:  5   zolpidem (AMBIEN) 10 MG tablet    Sig: TAKE 1 TABLET BY MOUTH EVERYDAY AT BEDTIME    Dispense:  30 tablet    Refill:  2    Please send 30 day supply, patient takes every day   venlafaxine XR (EFFEXOR-XR) 37.5 MG 24 hr capsule    Sig: Take 1 capsule (37.5 mg total) by mouth daily with breakfast.    Dispense:  90 capsule    Refill:  1    For future refills    Return for previously scheduled, CPE,  Willia Lampert PCP in august.   Total time spent:30 Minutes Time spent includes review of chart, medications, test results, and follow up plan with the patient.   [place] Controlled Substance  Database was reviewed by me.  This patient was seen by Sallyanne Kuster, FNP-C in collaboration with Dr. Beverely Risen as a part of collaborative care agreement.   Shawan Tosh R. Tedd Sias, MSN, FNP-C Internal medicine

## 2023-03-09 NOTE — Telephone Encounter (Signed)
Discontinued in August 2023, patient was switched to venlafaxine

## 2023-04-07 ENCOUNTER — Emergency Department: Payer: 59

## 2023-04-07 ENCOUNTER — Other Ambulatory Visit: Payer: Self-pay

## 2023-04-07 ENCOUNTER — Encounter: Payer: Self-pay | Admitting: *Deleted

## 2023-04-07 ENCOUNTER — Emergency Department
Admission: EM | Admit: 2023-04-07 | Discharge: 2023-04-07 | Disposition: A | Discharge status: Home / Self Care | Payer: 59 | Attending: Emergency Medicine | Admitting: Emergency Medicine

## 2023-04-07 DIAGNOSIS — R0789 Other chest pain: Secondary | ICD-10-CM | POA: Insufficient documentation

## 2023-04-07 DIAGNOSIS — R0602 Shortness of breath: Secondary | ICD-10-CM | POA: Diagnosis not present

## 2023-04-07 DIAGNOSIS — E119 Type 2 diabetes mellitus without complications: Secondary | ICD-10-CM | POA: Insufficient documentation

## 2023-04-07 DIAGNOSIS — I10 Essential (primary) hypertension: Secondary | ICD-10-CM | POA: Insufficient documentation

## 2023-04-07 DIAGNOSIS — R0981 Nasal congestion: Secondary | ICD-10-CM | POA: Diagnosis not present

## 2023-04-07 DIAGNOSIS — R059 Cough, unspecified: Secondary | ICD-10-CM | POA: Diagnosis not present

## 2023-04-07 DIAGNOSIS — R079 Chest pain, unspecified: Secondary | ICD-10-CM | POA: Diagnosis not present

## 2023-04-07 LAB — BASIC METABOLIC PANEL
Anion gap: 11 (ref 5–15)
BUN: 12 mg/dL (ref 8–23)
CO2: 26 mmol/L (ref 22–32)
Calcium: 9.8 mg/dL (ref 8.9–10.3)
Chloride: 101 mmol/L (ref 98–111)
Creatinine, Ser: 0.81 mg/dL (ref 0.44–1.00)
GFR, Estimated: >60 mL/min (ref >60)
Glucose, Bld: 106 mg/dL — ABNORMAL HIGH (ref 70–99)
Potassium: 3.1 mmol/L — ABNORMAL LOW (ref 3.5–5.1)
Sodium: 138 mmol/L (ref 135–145)

## 2023-04-07 LAB — CBC
HCT: 39.6 % (ref 36.0–46.0)
Hemoglobin: 13.1 g/dL (ref 12.0–15.0)
MCH: 29.9 pg (ref 26.0–34.0)
MCHC: 33.1 g/dL (ref 30.0–36.0)
MCV: 90.4 fL (ref 80.0–100.0)
Platelets: 343 10*3/uL (ref 150–400)
RBC: 4.38 MIL/uL (ref 3.87–5.11)
RDW: 12.5 % (ref 11.5–15.5)
WBC: 12 10*3/uL — ABNORMAL HIGH (ref 4.0–10.5)
nRBC: 0 % (ref 0.0–0.2)

## 2023-04-07 LAB — TROPONIN I (HIGH SENSITIVITY)
Troponin I (High Sensitivity): 3 ng/L (ref <18)
Troponin I (High Sensitivity): 3 ng/L (ref <18)

## 2023-04-07 MED ORDER — POTASSIUM CHLORIDE CRYS ER 20 MEQ PO TBCR
40.0000 meq | EXTENDED_RELEASE_TABLET | Freq: Once | ORAL | Status: AC
  Administered 2023-04-07: 40 meq via ORAL
  Filled 2023-04-07: qty 2

## 2023-04-07 MED ORDER — KETOROLAC TROMETHAMINE 15 MG/ML IJ SOLN
15.0000 mg | Freq: Once | INTRAMUSCULAR | Status: AC
  Administered 2023-04-07: 15 mg via INTRAVENOUS
  Filled 2023-04-07: qty 1

## 2023-04-07 NOTE — Discharge Instructions (Signed)
You were seen in the Emergency Department today for evaluation of your chest pain. Fortunately, your labs, EKG, and chest x-Lenay Lovejoy were overall reassuring against a emergency cause for your pain.  This could be related to inflammation of your lung lining known as pleurisy or musculoskeletal strain, but it is important you follow-up to evaluate for other cardiac causes of your pain.  I have placed a referral for cardiology to arrange follow-up for your chest pain.  You can take Tylenol and ibuprofen as needed for your pain, unless there is another reason that you should not take these. Return to the ER for any new or worsening symptoms including worsening chest pain, difficulty breathing, or any other new or concerning symptoms that you believe warrants immediate attention.

## 2023-04-07 NOTE — ED Triage Notes (Signed)
Pt is here for CP which has been intermittent all day but worse over the past .  Pain is tightness with radiation to left arm and shoulders, pt has sob with this.

## 2023-04-07 NOTE — ED Provider Notes (Signed)
[facility] Provider Note    Event Date/Time   First MD Initiated Contact with Patient 04/07/23 2131     (approximate)   History   Chest Pain   HPI  Valerie Ramos is a 67 y.o. female with history of T2DM, HTN presenting to the emergency department for evaluation of chest pain.  For the last day or so, patient has been having intermittent chest pain.  It did worsen about 30 minutes prior to presentation.  Described as a tightness with occasional radiation to her left arm.  She does report associated shortness of breath.  Not specifically pleuritic or exertional.  Has had some congestion and cough recently.  Pain is not reproducible with palpation.  Does states she has had a history of multiple similar events in the past without clear etiology.      Physical Exam   Triage Vital Signs: ED Triage Vitals  Enc Vitals Group     BP 04/07/23 1940 (!) 151/68     Pulse Rate 04/07/23 1940 80     Resp 04/07/23 1940 14     Temp 04/07/23 1940 98.7 F (37.1 C)     Temp Source 04/07/23 1940 Oral     SpO2 04/07/23 1940 99 %     Weight 04/07/23 1934 237 lb (107.5 kg)     Height 04/07/23 1934 5' 8.75" (1.746 m)     Head Circumference --      Peak Flow --      Pain Score 04/07/23 1934 8     Pain Loc --      Pain Edu? --      Excl. in GC? --     Most recent vital signs: Vitals:   04/07/23 2215 04/07/23 2230  BP:  134/66  Pulse: 62 63  Resp: (!) 24 (!) 22  Temp:    SpO2: 98% 100%     General: Awake, interactive  CV:  Regular rate, good peripheral perfusion.  Chest wall: Nontender to palpation Resp:  Lungs clear, unlabored respirations.  Abd:  Soft, nondistended.  Neuro:  Symmetric facial movement, fluid speech   ED Results / Procedures / Treatments   Labs (all labs ordered are listed, but only abnormal results are displayed) Labs Reviewed  BASIC METABOLIC PANEL - Abnormal; Notable for the following components:      Result Value   Potassium 3.1 (*)     Glucose, Bld 106 (*)    All other components within normal limits  CBC - Abnormal; Notable for the following components:   WBC 12.0 (*)    All other components within normal limits  TROPONIN I (HIGH SENSITIVITY)  TROPONIN I (HIGH SENSITIVITY)     EKG EKG independently reviewed interpreted by myself (ER attending) demonstrates:  EKG demonstrates normal sinus rhythm and rate of 83, PR 138, QRS 86, QTc 455, no acute ST changes  HEART SCORE:    History:               Highly Suspicious - 2                               Moderately Suspicious - 1                               Slightly Suspicious - 0  ECG  Significant ST-deviation - 2                               Nonspecific repolarization - 1                               Normal - 0  Age                      > 44 Years Old         - 2                               >27 and <74 Years Old - 1                               <56 years old - 0  Risk Factors       3 risk factors or history or known disease - 2                              1 or 2 risk factors - 1                              No risk factors known - 0  Troponin            3 times the normal limit - 2                              >1 and <3 times the normal limit - 1                              1 times the normal limit - 0 Total Score:     3  RADIOLOGY Imaging independently reviewed and interpreted by myself demonstrates:  Chest x-Zebulun Deman without evidence of pneumonia, pneumothorax, other acute abnormality  PROCEDURES:  Critical Care performed: No  Procedures   MEDICATIONS ORDERED IN ED: Medications  ketorolac (TORADOL) 15 MG/ML injection 15 mg (15 mg Intravenous Given 04/07/23 2244)     IMPRESSION / MDM / ASSESSMENT AND PLAN / ED COURSE  I reviewed the triage vital signs and the nursing notes.  Differential diagnosis includes, but is not limited to, ACS, pneumonia, pneumothorax, musculoskeletal strain, pleurisy, low suspicion PE or dissection  based on clinical history  Patient's presentation is most consistent with acute presentation with potential threat to life or bodily function.  67 year old female presenting to the emergency room for evaluation of chest pain.  Labs overall reassuring.  Slightly low potassium, oral repletion ordered.  Initial and repeat troponin negative.  Chest x-Rahn Lacuesta and EKG reviewed.  Possible musculoskeletal pain or pleurisy in the setting of recent cough and congestion.  Patient was given Toradol with improvement in her symptoms.  I did discuss possibility of admission for further evaluation of her chest pain, but patient is comfortable with outpatient follow-up and she does have a low risk heart score.  Strict return precautions were provided.  Referral to cardiology placed.  Patient discharged in stable condition.  FINAL CLINICAL IMPRESSION(S) / ED DIAGNOSES   Final diagnoses:  Atypical chest pain     Rx / DC Orders   ED Discharge Orders          Ordered    Ambulatory referral to Cardiology       Comments: If you have not heard from the Cardiology office within the next 72 hours please call [phone].   04/07/23 2323             Note:  This document was prepared using Dragon voice recognition software and may include unintentional dictation errors.   Trinna Post, MD 04/07/23 2325

## 2023-04-07 NOTE — ED Notes (Signed)
Patient reports episodes of sharp pain in the left chest. Pain has subsided at this time.

## 2023-04-09 ENCOUNTER — Telehealth: Payer: Self-pay | Admitting: Nurse Practitioner

## 2023-04-09 NOTE — Telephone Encounter (Signed)
VM not set up. Sent message to schedule ED follow up-[name]

## 2023-04-10 ENCOUNTER — Other Ambulatory Visit: Payer: Self-pay | Admitting: Nurse Practitioner

## 2023-04-10 DIAGNOSIS — Z79899 Other long term (current) drug therapy: Secondary | ICD-10-CM

## 2023-04-11 ENCOUNTER — Other Ambulatory Visit: Payer: Self-pay | Admitting: Nurse Practitioner

## 2023-04-11 DIAGNOSIS — Z79899 Other long term (current) drug therapy: Secondary | ICD-10-CM

## 2023-04-13 ENCOUNTER — Telehealth: Payer: Self-pay | Admitting: Nurse Practitioner

## 2023-04-13 NOTE — Telephone Encounter (Signed)
Called patient to schedule Ed f/u. She stated she was doing fine and will just wait until August appointment-[name]

## 2023-04-21 DIAGNOSIS — M7521 Bicipital tendinitis, right shoulder: Secondary | ICD-10-CM | POA: Diagnosis not present

## 2023-04-27 ENCOUNTER — Other Ambulatory Visit: Payer: Self-pay | Admitting: Nurse Practitioner

## 2023-04-27 DIAGNOSIS — Z79899 Other long term (current) drug therapy: Secondary | ICD-10-CM

## 2023-04-27 DIAGNOSIS — F411 Generalized anxiety disorder: Secondary | ICD-10-CM

## 2023-05-03 ENCOUNTER — Other Ambulatory Visit: Payer: Self-pay | Admitting: Nurse Practitioner

## 2023-05-03 DIAGNOSIS — F331 Major depressive disorder, recurrent, moderate: Secondary | ICD-10-CM

## 2023-05-03 DIAGNOSIS — F411 Generalized anxiety disorder: Secondary | ICD-10-CM

## 2023-05-05 ENCOUNTER — Telehealth: Payer: Self-pay

## 2023-05-05 NOTE — Telephone Encounter (Signed)
Spoke with that we received letter from [facility] that venlafaxine Lot no [id number] and [id number] is recall please contact her pharmacy ASAP

## 2023-05-18 ENCOUNTER — Encounter: Payer: Self-pay | Admitting: Nurse Practitioner

## 2023-05-18 ENCOUNTER — Ambulatory Visit (INDEPENDENT_AMBULATORY_CARE_PROVIDER_SITE_OTHER): Payer: 59 | Admitting: Nurse Practitioner

## 2023-05-18 VITALS — BP 120/70 | HR 88 | Temp 98.2°F | Resp 16 | Ht 68.5 in | Wt 234.0 lb

## 2023-05-18 DIAGNOSIS — Z20818 Contact with and (suspected) exposure to other bacterial communicable diseases: Secondary | ICD-10-CM

## 2023-05-18 DIAGNOSIS — E1165 Type 2 diabetes mellitus with hyperglycemia: Secondary | ICD-10-CM

## 2023-05-18 DIAGNOSIS — Z79899 Other long term (current) drug therapy: Secondary | ICD-10-CM | POA: Diagnosis not present

## 2023-05-18 DIAGNOSIS — E559 Vitamin D deficiency, unspecified: Secondary | ICD-10-CM

## 2023-05-18 DIAGNOSIS — L659 Nonscarring hair loss, unspecified: Secondary | ICD-10-CM

## 2023-05-18 DIAGNOSIS — R5383 Other fatigue: Secondary | ICD-10-CM | POA: Diagnosis not present

## 2023-05-18 DIAGNOSIS — E782 Mixed hyperlipidemia: Secondary | ICD-10-CM | POA: Diagnosis not present

## 2023-05-18 DIAGNOSIS — Z0001 Encounter for general adult medical examination with abnormal findings: Secondary | ICD-10-CM | POA: Diagnosis not present

## 2023-05-18 DIAGNOSIS — R300 Dysuria: Secondary | ICD-10-CM

## 2023-05-18 DIAGNOSIS — J029 Acute pharyngitis, unspecified: Secondary | ICD-10-CM

## 2023-05-18 DIAGNOSIS — E538 Deficiency of other specified B group vitamins: Secondary | ICD-10-CM

## 2023-05-18 DIAGNOSIS — F5101 Primary insomnia: Secondary | ICD-10-CM

## 2023-05-18 LAB — POCT GLYCOSYLATED HEMOGLOBIN (HGB A1C): Hemoglobin A1C: 7.4 % — AB (ref 4.0–5.6)

## 2023-05-18 MED ORDER — LOSARTAN POTASSIUM 100 MG PO TABS
100.0000 mg | ORAL_TABLET | Freq: Every day | ORAL | 3 refills | Status: DC
Start: 2023-05-18 — End: 2024-04-15

## 2023-05-18 MED ORDER — DULAGLUTIDE 3 MG/0.5ML ~~LOC~~ SOAJ: 3.0000 mg | SUBCUTANEOUS | 5 refills | Status: DC

## 2023-05-18 MED ORDER — PANTOPRAZOLE SODIUM 40 MG PO TBEC
40.0000 mg | DELAYED_RELEASE_TABLET | Freq: Every day | ORAL | 3 refills | Status: DC
Start: 2023-05-18 — End: 2023-12-21

## 2023-05-18 MED ORDER — SUMATRIPTAN SUCCINATE 50 MG PO TABS: ORAL_TABLET | ORAL | 3 refills | Status: DC

## 2023-05-18 MED ORDER — ZOLPIDEM TARTRATE 10 MG PO TABS
ORAL_TABLET | ORAL | 2 refills | Status: DC
Start: 2023-05-18 — End: 2023-10-12

## 2023-05-18 MED ORDER — GABAPENTIN 300 MG PO CAPS
300.0000 mg | ORAL_CAPSULE | Freq: Every day | ORAL | 3 refills | Status: DC
Start: 2023-05-18 — End: 2023-12-03

## 2023-05-18 MED ORDER — ESTRADIOL 0.5 MG PO TABS
0.5000 mg | ORAL_TABLET | Freq: Every day | ORAL | 5 refills | Status: DC
Start: 2023-05-18 — End: 2023-12-21

## 2023-05-18 MED ORDER — VENLAFAXINE HCL ER 75 MG PO CP24
75.0000 mg | ORAL_CAPSULE | Freq: Every day | ORAL | 4 refills | Status: DC
Start: 2023-05-18 — End: 2023-06-11

## 2023-05-18 MED ORDER — ATENOLOL 25 MG PO TABS
ORAL_TABLET | ORAL | 3 refills | Status: DC
Start: 2023-05-18 — End: 2023-12-21

## 2023-05-18 MED ORDER — TRAMADOL HCL 50 MG PO TABS
50.0000 mg | ORAL_TABLET | Freq: Two times a day (BID) | ORAL | 2 refills | Status: DC | PRN
Start: 2023-05-18 — End: 2024-03-18

## 2023-05-18 MED ORDER — METOCLOPRAMIDE HCL 10 MG PO TABS: 10.0000 mg | ORAL_TABLET | Freq: Three times a day (TID) | ORAL | 5 refills | Status: DC

## 2023-05-18 NOTE — Progress Notes (Signed)
[facility] [address] [place], [place] [address]  Internal MEDICINE  Office Visit Note  Patient Name: Valerie Ramos  [id number]  [id number]  Date of Service: 05/18/2023  Chief Complaint  Patient presents with   Depression   Diabetes   Hypertension   Annual Exam    HPI Maxx presents for an annual well visit and physical exam.  Well-appearing 67 y.o. 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 Her husband has parkinson's disease and their son has multiple sclerosis. Both are still functioning independently most of the time. It does get stressful at times and she does worry about both of them often but she tries to stay strong for them as well. Routine CRC screening: due in 2028 Routine mammogram: due in march next year DEXA scan: due next year Pap smear: due next year or can opt out at age 77 Eye exam: overdue foot exam: done today Labs:  due for routine labs New or worsening pain: none  Allergies -- Otc allergy meds  A1c improved down to 7.4 from 8.4 in April, good job!  Current Medication: Outpatient Encounter Medications as of 05/18/2023  Medication Sig   acetaminophen (TYLENOL) 325 MG tablet Take 2 tablets (650 mg total) by mouth every 6 (six) hours as needed for mild pain (or Fever >/= 101).   Dulaglutide 3 MG/0.5ML SOPN Inject 3 mg into the skin once a week.   fluticasone (FLONASE) 50 MCG/ACT nasal spray SPRAY 2 SPRAYS INTO EACH NOSTRIL EVERY DAY   gabapentin (NEURONTIN) 300 MG capsule Take 1 capsule (300 mg total) by mouth at bedtime.   glimepiride (AMARYL) 1 MG tablet Take 1 tablet by mouth daily with breakfast and dinner.   hydrochlorothiazide (HYDRODIURIL) 25 MG tablet TAKE 1 TABLET (25 MG TOTAL) BY MOUTH DAILY.   Lancets (ONETOUCH DELICA PLUS LANCET30G) MISC 1 EACH BY DOES NOT APPLY ROUTE DAILY. USE AS DIRECTED ONCE DAILY DIAG E11.65   ONETOUCH ULTRA test strip FOR ONCE  DAILY TESTING DX. E11.65   oxybutynin (DITROPAN-XL) 10 MG 24 hr tablet TAKE 1 TABLET BY MOUTH EVERYDAY AT BEDTIME   PNEUMOCOCCAL 20-VAL CONJ VACC IM Inject into the muscle.   rosuvastatin (CRESTOR) 5 MG tablet TAKE 1 TABLET (5 MG TOTAL) BY MOUTH DAILY.   tiZANidine (ZANAFLEX) 2 MG tablet TAKE 1 TABLET (2MG  TOTAL) BY MOUTH TWICE A DAY AS NEEDED FOR MUSCLE SPASM   venlafaxine XR (EFFEXOR-XR) 75 MG 24 hr capsule Take 1 capsule (75 mg total) by mouth daily with breakfast.   [DISCONTINUED] ALPRAZolam (XANAX) 0.5 MG tablet TAKE 1 TAB BY MOUTH TWICE DAILY AS NEEDED FOR ANXIETY ATTACKS (DO NOT TAKE DAILY DUE TO DEPENDENCE)   [DISCONTINUED] amoxicillin-clavulanate (AUGMENTIN) 875-125 MG tablet Take 1 tablet by mouth 2 (two) times daily. Take with food.   [DISCONTINUED] atenolol (TENORMIN) 25 MG tablet TAKE 1 TABLET BY MOUTH AT BEDTIME FOR BLOOD PRESSURE   [DISCONTINUED] Dulaglutide 1.5 MG/0.5ML SOPN Inject 1.5 mg into the skin once a week.   [DISCONTINUED] estradiol (ESTRACE) 0.5 MG tablet Take 1 tablet (0.5 mg total) by mouth daily.   [DISCONTINUED] gabapentin (NEURONTIN) 100 MG capsule TAKE 1 CAPSULE BY MOUTH EVERY MORNING AND 1 CAPSULES AT NIGHT   [DISCONTINUED] losartan (COZAAR) 100 MG tablet Take 1 tablet (100 mg total) by mouth daily.   [DISCONTINUED] metoCLOPramide (REGLAN) 10 MG tablet Take 1 tablet (10 mg total) by mouth 3 (three) times daily before meals.   [  DISCONTINUED] pantoprazole (PROTONIX) 40 MG tablet Take 1 tablet (40 mg total) by mouth daily.   [DISCONTINUED] SUMAtriptan (IMITREX) 50 MG tablet TAKE 1 TABLET EVERY 2 HOURS AS NEEDED FOR MIGRAINE. MAY REPEAT IN 2 HOURS IF HEADACHE PERSISTS OR RECURS. LIMIT TO 2 DOSES PER 24 HOURS   [DISCONTINUED] traMADol (ULTRAM) 50 MG tablet Take 1 tablet (50 mg total) by mouth every 12 (twelve) hours as needed for moderate pain or severe pain.   [DISCONTINUED] venlafaxine XR (EFFEXOR-XR) 37.5 MG 24 hr capsule Take 1 capsule (37.5 mg total) by mouth daily  with breakfast.   [DISCONTINUED] zolpidem (AMBIEN) 10 MG tablet TAKE 1 TABLET BY MOUTH EVERYDAY AT BEDTIME   atenolol (TENORMIN) 25 MG tablet TAKE 1 TABLET BY MOUTH AT BEDTIME FOR BLOOD PRESSURE   estradiol (ESTRACE) 0.5 MG tablet Take 1 tablet (0.5 mg total) by mouth daily.   losartan (COZAAR) 100 MG tablet Take 1 tablet (100 mg total) by mouth daily.   metoCLOPramide (REGLAN) 10 MG tablet Take 1 tablet (10 mg total) by mouth 3 (three) times daily before meals.   pantoprazole (PROTONIX) 40 MG tablet Take 1 tablet (40 mg total) by mouth daily.   SUMAtriptan (IMITREX) 50 MG tablet TAKE 1 TABLET EVERY 2 HOURS AS NEEDED FOR MIGRAINE. MAY REPEAT IN 2 HOURS IF HEADACHE PERSISTS OR RECURS. LIMIT TO 2 DOSES PER 24 HOURS   traMADol (ULTRAM) 50 MG tablet Take 1 tablet (50 mg total) by mouth every 12 (twelve) hours as needed for moderate pain or severe pain.   zolpidem (AMBIEN) 10 MG tablet TAKE 1 TABLET BY MOUTH EVERYDAY AT BEDTIME   No facility-administered encounter medications on file as of 05/18/2023.    Surgical History: Past Surgical History:  Procedure Laterality Date   ABDOMINAL HYSTERECTOMY     CESAREAN SECTION  1989   COLONOSCOPY     COLONOSCOPY N/A 11/28/2021   Procedure: COLONOSCOPY;  Surgeon: Toney Reil, MD;  Location: [facility] ENDOSCOPY;  Service: Gastroenterology;  Laterality: N/A;   CYSTECTOMY  2000   Ovarian   ESOPHAGOGASTRODUODENOSCOPY     OOPHORECTOMY     OVARIAN CYST SURGERY  2006   RECTOVAGINAL FISTULA CLOSURE  1980    Medical History: Past Medical History:  Diagnosis Date   Anxiety    Arthritis    Depression    Diabetes 1.5, managed as type 2 (HCC)    Hypertension    Insomnia    Migraines    Reflux    Sinusitis     Family History: Family History  Problem Relation Age of Onset   Hodgkin's lymphoma Mother    Diabetes Mother    Thyroid disease Mother    Glaucoma Mother    Stroke Brother    Heart disease Brother     Social History   Socioeconomic  History   Marital status: Married    Spouse name: Not on file   Number of children: Not on file   Years of education: Not on file   Highest education level: Not on file  Occupational History   Not on file  Tobacco Use   Smoking status: Former    Current packs/day: 0.00    Types: Cigarettes    Quit date: 05/24/2011    Years since quitting: 12.0   Smokeless tobacco: Never  Vaping Use   Vaping status: Not on file  Substance and Sexual Activity   Alcohol use: No   Drug use: No   Sexual activity: Not on file  Other Topics Concern   Not on file  Social History Narrative   Not on file   Social Determinants of Health   Financial Resource Strain: Not on file  Food Insecurity: Not on file  Transportation Needs: Not on file  Physical Activity: Not on file  Stress: Not on file  Social Connections: Not on file  Intimate Partner Violence: Not on file      Review of Systems  Constitutional:  Negative for chills, fatigue and unexpected weight change.  HENT:  Negative for congestion, postnasal drip, rhinorrhea, sneezing and sore throat.   Eyes:  Negative for redness.  Respiratory:  Negative for cough, chest tightness and shortness of breath.   Cardiovascular:  Negative for chest pain and palpitations.  Gastrointestinal:  Negative for abdominal pain, constipation, diarrhea, nausea and vomiting.  Genitourinary:  Negative for dysuria and frequency.  Musculoskeletal:  Negative for arthralgias, back pain, joint swelling and neck pain.  Skin:  Negative for rash.  Neurological: Negative.  Negative for tremors and numbness.  Hematological:  Negative for adenopathy. Does not bruise/bleed easily.  Psychiatric/Behavioral:  Positive for behavioral problems (Depression), decreased concentration, dysphoric mood and sleep disturbance. Negative for self-injury and suicidal ideas. The patient is nervous/anxious.        Last year she was experiencing Anhedonia, fatigue, overeating d/t stress,  feelings of hopelessness and powerlessness. This year her symptoms are much improved and controlled.     Vital Signs: BP 120/70   Pulse 88   Temp 98.2 F (36.8 C)   Resp 16   Ht 5' 8.5" (1.74 m)   Wt 234 lb (106.1 kg)   SpO2 98%   BMI 35.06 kg/m    Physical Exam Vitals reviewed.  Constitutional:      General: She is not in acute distress.    Appearance: Normal appearance. She is well-developed. She is obese. She is not ill-appearing or diaphoretic.  HENT:     Head: Normocephalic and atraumatic.     Right Ear: Tympanic membrane, ear canal and external ear normal. There is no impacted cerumen.     Left Ear: Tympanic membrane, ear canal and external ear normal. There is no impacted cerumen.     Nose: Nose normal. No congestion or rhinorrhea.     Mouth/Throat:     Mouth: Mucous membranes are moist.     Pharynx: Oropharynx is clear. No oropharyngeal exudate or posterior oropharyngeal erythema.  Eyes:     General: No scleral icterus.       Right eye: No discharge.        Left eye: No discharge.     Extraocular Movements: Extraocular movements intact.     Conjunctiva/sclera: Conjunctivae normal.     Pupils: Pupils are equal, round, and reactive to light.  Neck:     Thyroid: No thyromegaly.     Vascular: No JVD.     Trachea: No tracheal deviation.  Cardiovascular:     Rate and Rhythm: Normal rate and regular rhythm.     Pulses: Normal pulses.     Heart sounds: Normal heart sounds. No murmur heard.    No friction rub. No gallop.  Pulmonary:     Effort: Pulmonary effort is normal. No respiratory distress.     Breath sounds: Normal breath sounds. No stridor. No wheezing or rales.  Chest:     Chest wall: No tenderness.  Breasts:    Right: Normal.     Left: Normal.  Abdominal:     General:  Bowel sounds are normal. There is no distension.     Palpations: Abdomen is soft. There is no mass.     Tenderness: There is no abdominal tenderness. There is no guarding or rebound.   Musculoskeletal:        General: No tenderness or deformity. Normal range of motion.     Cervical back: Normal range of motion and neck supple.  Lymphadenopathy:     Cervical: No cervical adenopathy.  Skin:    General: Skin is warm and dry.     Coloration: Skin is not pale.     Findings: No erythema or rash.  Neurological:     General: No focal deficit present.     Mental Status: She is alert.     Cranial Nerves: No cranial nerve deficit.     Motor: No abnormal muscle tone.     Coordination: Coordination normal.     Deep Tendon Reflexes: Reflexes are normal and symmetric.  Psychiatric:        Mood and Affect: Affect normal. Mood is anxious (intermittent). Mood is not depressed.        Speech: Speech normal.        Behavior: Behavior normal. Behavior is cooperative.        Thought Content: Thought content normal. Thought content is not paranoid or delusional. Thought content does not include homicidal or suicidal ideation.        Judgment: Judgment normal.        Assessment/Plan: 1. Encounter for routine adult health examination with abnormal findings Age-appropriate preventive screenings and vaccinations discussed, annual physical exam completed. Routine labs for health maintenance ordered, see below. PHM updated.  - CMP14+EGFR - Lipid Profile - B12 and Folate Panel - Iron, TIBC and Ferritin Panel - CBC with Differential/Platelet  2. Type 2 diabetes mellitus with hyperglycemia, without long-term current use of insulin (HCC) A1c improving, repeat in 4 months. Routine labs ordered. Trulicity dose increased to help facilitate continued decrease in A1c and weight loss.  - POCT glycosylated hemoglobin (Hb A1C) - Dulaglutide 3 MG/0.5ML SOPN; Inject 3 mg into the skin once a week.  Dispense: 2 mL; Refill: 5 - CMP14+EGFR - Lipid Profile - B12 and Folate Panel - Iron, TIBC and Ferritin Panel - CBC with Differential/Platelet  3. Hair loss Routine labs ordered  - CMP14+EGFR -  Lipid Profile - Vitamin D (25 hydroxy) - B12 and Folate Panel - Iron, TIBC and Ferritin Panel - CBC with Differential/Platelet  4. Other fatigue Routine labs ordered  - CMP14+EGFR - Lipid Profile - B12 and Folate Panel - Iron, TIBC and Ferritin Panel - CBC with Differential/Platelet  5. Mixed hyperlipidemia Routine labs ordered  - CMP14+EGFR - Lipid Profile - CBC with Differential/Platelet  6. B12 deficiency Routine labs ordered  - B12 and Folate Panel - Iron, TIBC and Ferritin Panel - CBC with Differential/Platelet  7. Vitamin D deficiency Routine lab ordered  - Vitamin D (25 hydroxy)  8. Dysuria Routine urinalysis done  - UA/M w/rflx Culture, Routine - Microscopic Examination  9. Encounter for medication review Medication list reviewed, updated and refills ordered. Venlafaxine dose was increased and gabapentin dose was changed.  - zolpidem (AMBIEN) 10 MG tablet; TAKE 1 TABLET BY MOUTH EVERYDAY AT BEDTIME  Dispense: 30 tablet; Refill: 2 - traMADol (ULTRAM) 50 MG tablet; Take 1 tablet (50 mg total) by mouth every 12 (twelve) hours as needed for moderate pain or severe pain.  Dispense: 60 tablet; Refill: 2 - SUMAtriptan (IMITREX)  50 MG tablet; TAKE 1 TABLET EVERY 2 HOURS AS NEEDED FOR MIGRAINE. MAY REPEAT IN 2 HOURS IF HEADACHE PERSISTS OR RECURS. LIMIT TO 2 DOSES PER 24 HOURS  Dispense: 10 tablet; Refill: 3 - pantoprazole (PROTONIX) 40 MG tablet; Take 1 tablet (40 mg total) by mouth daily.  Dispense: 90 tablet; Refill: 3 - metoCLOPramide (REGLAN) 10 MG tablet; Take 1 tablet (10 mg total) by mouth 3 (three) times daily before meals.  Dispense: 90 tablet; Refill: 5 - losartan (COZAAR) 100 MG tablet; Take 1 tablet (100 mg total) by mouth daily.  Dispense: 90 tablet; Refill: 3 - gabapentin (NEURONTIN) 300 MG capsule; Take 1 capsule (300 mg total) by mouth at bedtime.  Dispense: 30 capsule; Refill: 3 - estradiol (ESTRACE) 0.5 MG tablet; Take 1 tablet (0.5 mg total) by mouth  daily.  Dispense: 30 tablet; Refill: 5 - atenolol (TENORMIN) 25 MG tablet; TAKE 1 TABLET BY MOUTH AT BEDTIME FOR BLOOD PRESSURE  Dispense: 90 tablet; Refill: 3 - venlafaxine XR (EFFEXOR-XR) 75 MG 24 hr capsule; Take 1 capsule (75 mg total) by mouth daily with breakfast.  Dispense: 30 capsule; Refill: 4      General Counseling: Letitia Neri understanding of the findings of todays visit and agrees with plan of treatment. I have discussed any further diagnostic evaluation that may be needed or ordered today. We also reviewed her medications today. she has been encouraged to call the office with any questions or concerns that should arise related to todays visit.    Orders Placed This Encounter  Procedures   Microscopic Examination   UA/M w/rflx Culture, Routine   CMP14+EGFR   Lipid Profile   Vitamin D (25 hydroxy)   B12 and Folate Panel   Iron, TIBC and Ferritin Panel   CBC with Differential/Platelet   POCT glycosylated hemoglobin (Hb A1C)    Meds ordered this encounter  Medications   Dulaglutide 3 MG/0.5ML SOPN    Sig: Inject 3 mg into the skin once a week.    Dispense:  2 mL    Refill:  5    Note increased dose, discontinue 1.5 mg dose. Fill new script now.   zolpidem (AMBIEN) 10 MG tablet    Sig: TAKE 1 TABLET BY MOUTH EVERYDAY AT BEDTIME    Dispense:  30 tablet    Refill:  2    Please send 30 day supply, patient takes every day   traMADol (ULTRAM) 50 MG tablet    Sig: Take 1 tablet (50 mg total) by mouth every 12 (twelve) hours as needed for moderate pain or severe pain.    Dispense:  60 tablet    Refill:  2   SUMAtriptan (IMITREX) 50 MG tablet    Sig: TAKE 1 TABLET EVERY 2 HOURS AS NEEDED FOR MIGRAINE. MAY REPEAT IN 2 HOURS IF HEADACHE PERSISTS OR RECURS. LIMIT TO 2 DOSES PER 24 HOURS    Dispense:  10 tablet    Refill:  3   pantoprazole (PROTONIX) 40 MG tablet    Sig: Take 1 tablet (40 mg total) by mouth daily.    Dispense:  90 tablet    Refill:  3    metoCLOPramide (REGLAN) 10 MG tablet    Sig: Take 1 tablet (10 mg total) by mouth 3 (three) times daily before meals.    Dispense:  90 tablet    Refill:  5   losartan (COZAAR) 100 MG tablet    Sig: Take 1 tablet (100 mg total) by mouth  daily.    Dispense:  90 tablet    Refill:  3   gabapentin (NEURONTIN) 300 MG capsule    Sig: Take 1 capsule (300 mg total) by mouth at bedtime.    Dispense:  30 capsule    Refill:  3   estradiol (ESTRACE) 0.5 MG tablet    Sig: Take 1 tablet (0.5 mg total) by mouth daily.    Dispense:  30 tablet    Refill:  5    For future refills   atenolol (TENORMIN) 25 MG tablet    Sig: TAKE 1 TABLET BY MOUTH AT BEDTIME FOR BLOOD PRESSURE    Dispense:  90 tablet    Refill:  3   venlafaxine XR (EFFEXOR-XR) 75 MG 24 hr capsule    Sig: Take 1 capsule (75 mg total) by mouth daily with breakfast.    Dispense:  30 capsule    Refill:  4    Note increased dose, discontinue 37.5 mg dose, fill new script today asap.    Return in about 1 month (around 06/18/2023) for F/U, Karron Goens PCP increased dose of trulicity and venlafaxine, change in gabapentin.   Total time spent:30 Minutes Time spent includes review of chart, medications, test results, and follow up plan with the patient.   [place] Controlled Substance Database was reviewed by me.  This patient was seen by Sallyanne Kuster, FNP-C in collaboration with Dr. Beverely Risen as a part of collaborative care agreement.  Ziere Docken R. Tedd Sias, MSN, FNP-C Internal medicine

## 2023-05-19 LAB — MICROSCOPIC EXAMINATION
Bacteria, UA: NONE SEEN
Casts: NONE SEEN /LPF
WBC, UA: NONE SEEN /HPF (ref 0–5)

## 2023-05-19 LAB — UA/M W/RFLX CULTURE, ROUTINE
Bilirubin, UA: NEGATIVE
Glucose, UA: NEGATIVE
Leukocytes,UA: NEGATIVE
Nitrite, UA: NEGATIVE
RBC, UA: NEGATIVE
Specific Gravity, UA: 1.019 (ref 1.005–1.030)
Urobilinogen, Ur: 1 mg/dL (ref 0.2–1.0)
pH, UA: 6.5 (ref 5.0–7.5)

## 2023-05-28 ENCOUNTER — Other Ambulatory Visit: Payer: Self-pay | Admitting: Nurse Practitioner

## 2023-05-28 DIAGNOSIS — Z79899 Other long term (current) drug therapy: Secondary | ICD-10-CM

## 2023-05-28 DIAGNOSIS — E1165 Type 2 diabetes mellitus with hyperglycemia: Secondary | ICD-10-CM

## 2023-06-05 ENCOUNTER — Other Ambulatory Visit
Admission: RE | Admit: 2023-06-05 | Discharge: 2023-06-05 | Disposition: A | Discharge status: Home / Self Care | Payer: 59 | Source: Ambulatory Visit | Attending: Nurse Practitioner | Admitting: Nurse Practitioner

## 2023-06-05 DIAGNOSIS — E1169 Type 2 diabetes mellitus with other specified complication: Secondary | ICD-10-CM | POA: Diagnosis not present

## 2023-06-05 DIAGNOSIS — L659 Nonscarring hair loss, unspecified: Secondary | ICD-10-CM | POA: Diagnosis not present

## 2023-06-05 DIAGNOSIS — Z0001 Encounter for general adult medical examination with abnormal findings: Secondary | ICD-10-CM | POA: Diagnosis not present

## 2023-06-05 DIAGNOSIS — E538 Deficiency of other specified B group vitamins: Secondary | ICD-10-CM | POA: Diagnosis not present

## 2023-06-05 DIAGNOSIS — E782 Mixed hyperlipidemia: Secondary | ICD-10-CM | POA: Diagnosis not present

## 2023-06-05 DIAGNOSIS — R300 Dysuria: Secondary | ICD-10-CM | POA: Diagnosis not present

## 2023-06-05 DIAGNOSIS — E559 Vitamin D deficiency, unspecified: Secondary | ICD-10-CM | POA: Diagnosis not present

## 2023-06-05 DIAGNOSIS — R5383 Other fatigue: Secondary | ICD-10-CM | POA: Insufficient documentation

## 2023-06-05 LAB — CBC WITH DIFFERENTIAL/PLATELET
Abs Immature Granulocytes: 0.02 10*3/uL (ref 0.00–0.07)
Basophils Absolute: 0.1 10*3/uL (ref 0.0–0.1)
Basophils Relative: 1 %
Eosinophils Absolute: 0 10*3/uL (ref 0.0–0.5)
Eosinophils Relative: 0 %
HCT: 38.9 % (ref 36.0–46.0)
Hemoglobin: 12.9 g/dL (ref 12.0–15.0)
Immature Granulocytes: 0 %
Lymphocytes Relative: 25 %
Lymphs Abs: 2.4 10*3/uL (ref 0.7–4.0)
MCH: 30.1 pg (ref 26.0–34.0)
MCHC: 33.2 g/dL (ref 30.0–36.0)
MCV: 90.7 fL (ref 80.0–100.0)
Monocytes Absolute: 0.5 10*3/uL (ref 0.1–1.0)
Monocytes Relative: 6 %
Neutro Abs: 6.6 10*3/uL (ref 1.7–7.7)
Neutrophils Relative %: 68 %
Platelets: 329 10*3/uL (ref 150–400)
RBC: 4.29 MIL/uL (ref 3.87–5.11)
RDW: 12.7 % (ref 11.5–15.5)
WBC: 9.6 10*3/uL (ref 4.0–10.5)
nRBC: 0 % (ref 0.0–0.2)

## 2023-06-05 LAB — LIPID PANEL
Cholesterol: 163 mg/dL (ref 0–200)
HDL: 55 mg/dL (ref >40)
LDL Cholesterol: 87 mg/dL (ref 0–99)
Total CHOL/HDL Ratio: 3 ratio
Triglycerides: 107 mg/dL (ref <150)
VLDL: 21 mg/dL (ref 0–40)

## 2023-06-05 LAB — COMPREHENSIVE METABOLIC PANEL
ALT: 13 U/L (ref 0–44)
AST: 17 U/L (ref 15–41)
Albumin: 4 g/dL (ref 3.5–5.0)
Alkaline Phosphatase: 72 U/L (ref 38–126)
Anion gap: 11 (ref 5–15)
BUN: 16 mg/dL (ref 8–23)
CO2: 28 mmol/L (ref 22–32)
Calcium: 9.8 mg/dL (ref 8.9–10.3)
Chloride: 100 mmol/L (ref 98–111)
Creatinine, Ser: 0.84 mg/dL (ref 0.44–1.00)
GFR, Estimated: >60 mL/min (ref >60)
Glucose, Bld: 120 mg/dL — ABNORMAL HIGH (ref 70–99)
Potassium: 3.7 mmol/L (ref 3.5–5.1)
Sodium: 139 mmol/L (ref 135–145)
Total Bilirubin: 0.4 mg/dL (ref 0.3–1.2)
Total Protein: 7.8 g/dL (ref 6.5–8.1)

## 2023-06-05 LAB — FERRITIN: Ferritin: 18 ng/mL (ref 11–307)

## 2023-06-05 LAB — IRON AND TIBC
Iron: 70 ug/dL (ref 28–170)
Saturation Ratios: 16 % (ref 10.4–31.8)
TIBC: 430 ug/dL (ref 250–450)
UIBC: 360 ug/dL

## 2023-06-05 LAB — FOLATE: Folate: 9.4 ng/mL (ref >5.9)

## 2023-06-05 LAB — VITAMIN D 25 HYDROXY (VIT D DEFICIENCY, FRACTURES): Vit D, 25-Hydroxy: 31.7 ng/mL (ref 30–100)

## 2023-06-05 LAB — VITAMIN B12: Vitamin B-12: 306 pg/mL (ref 180–914)

## 2023-06-07 ENCOUNTER — Encounter: Payer: Self-pay | Admitting: Nurse Practitioner

## 2023-06-08 NOTE — Group Note (Deleted)

## 2023-06-11 ENCOUNTER — Other Ambulatory Visit: Payer: Self-pay | Admitting: Nurse Practitioner

## 2023-06-11 DIAGNOSIS — Z79899 Other long term (current) drug therapy: Secondary | ICD-10-CM

## 2023-06-18 ENCOUNTER — Ambulatory Visit: Payer: 59 | Admitting: Nurse Practitioner

## 2023-06-18 ENCOUNTER — Encounter: Payer: Self-pay | Admitting: Nurse Practitioner

## 2023-06-18 VITALS — BP 126/70 | HR 79 | Temp 98.2°F | Resp 16 | Ht 68.5 in | Wt 234.0 lb

## 2023-06-18 DIAGNOSIS — E1159 Type 2 diabetes mellitus with other circulatory complications: Secondary | ICD-10-CM | POA: Diagnosis not present

## 2023-06-18 DIAGNOSIS — R0789 Other chest pain: Secondary | ICD-10-CM | POA: Diagnosis not present

## 2023-06-18 DIAGNOSIS — R0602 Shortness of breath: Secondary | ICD-10-CM | POA: Diagnosis not present

## 2023-06-18 DIAGNOSIS — I259 Chronic ischemic heart disease, unspecified: Secondary | ICD-10-CM

## 2023-06-18 MED ORDER — NITROGLYCERIN 0.4 MG SL SUBL
0.4000 mg | SUBLINGUAL_TABLET | SUBLINGUAL | 3 refills | Status: AC | PRN
Start: 2023-06-18 — End: ?

## 2023-06-18 NOTE — Progress Notes (Signed)
[facility] [address] [place], [place] [address]  Internal MEDICINE  Office Visit Note  Patient Name: Valerie Ramos  [id number]  [id number]  Date of Service: 06/18/2023  Chief Complaint  Patient presents with   Depression   Diabetes   Hypertension   Follow-up    HPI Valerie Ramos presents for a follow-up visit for diabetes, venlafaxine, persistent chest pain, CT chest. Venlafaxine dose was increased at her previous office visit which has helped improved her anxiety and depressive symptoms Diabetes -- average 119, her most recent A1c 7.4 which is improving from previous A1c level. Glucose levels are continuing to be stable and/or improve further. Chest pain episodes continue -- starts over sternum, radiates to back and sometime right jaw. Pain lasts 5 min to 10 min then eases off. Has multiple episodes per day. Has continued to happen since her ER visit on 04/07/2023.  Needs CT chest -- has prior lung nodule, needs screening     Current Medication: Outpatient Encounter Medications as of 06/18/2023  Medication Sig   acetaminophen (TYLENOL) 325 MG tablet Take 2 tablets (650 mg total) by mouth every 6 (six) hours as needed for mild pain (or Fever >/= 101).   atenolol (TENORMIN) 25 MG tablet TAKE 1 TABLET BY MOUTH AT BEDTIME FOR BLOOD PRESSURE   Dulaglutide 3 MG/0.5ML SOPN Inject 3 mg into the skin once a week.   estradiol (ESTRACE) 0.5 MG tablet Take 1 tablet (0.5 mg total) by mouth daily.   fluticasone (FLONASE) 50 MCG/ACT nasal spray SPRAY 2 SPRAYS INTO EACH NOSTRIL EVERY DAY   gabapentin (NEURONTIN) 300 MG capsule Take 1 capsule (300 mg total) by mouth at bedtime.   glimepiride (AMARYL) 1 MG tablet Take 1 tablet by mouth daily with breakfast and dinner.   hydrochlorothiazide (HYDRODIURIL) 25 MG tablet TAKE 1 TABLET (25 MG TOTAL) BY MOUTH DAILY.   Lancets (ONETOUCH DELICA PLUS LANCET30G) MISC 1 EACH BY DOES NOT APPLY ROUTE DAILY. USE AS DIRECTED ONCE DAILY DIAG E11.65    losartan (COZAAR) 100 MG tablet Take 1 tablet (100 mg total) by mouth daily.   metoCLOPramide (REGLAN) 10 MG tablet Take 1 tablet (10 mg total) by mouth 3 (three) times daily before meals.   nitroGLYCERIN (NITROSTAT) 0.4 MG SL tablet Place 1 tablet (0.4 mg total) under the tongue every 5 (five) minutes as needed for chest pain. X 3 doses, then call 911 if chest pain continues with no relief.   ONETOUCH ULTRA test strip FOR ONCE DAILY TESTING DX. E11.65   oxybutynin (DITROPAN-XL) 10 MG 24 hr tablet TAKE 1 TABLET BY MOUTH EVERYDAY AT BEDTIME   pantoprazole (PROTONIX) 40 MG tablet Take 1 tablet (40 mg total) by mouth daily.   PNEUMOCOCCAL 20-VAL CONJ VACC IM Inject into the muscle.   rosuvastatin (CRESTOR) 5 MG tablet TAKE 1 TABLET (5 MG TOTAL) BY MOUTH DAILY.   SUMAtriptan (IMITREX) 50 MG tablet TAKE 1 TABLET EVERY 2 HOURS AS NEEDED FOR MIGRAINE. MAY REPEAT IN 2 HOURS IF HEADACHE PERSISTS OR RECURS. LIMIT TO 2 DOSES PER 24 HOURS   tiZANidine (ZANAFLEX) 2 MG tablet TAKE 1 TABLET (2MG  TOTAL) BY MOUTH TWICE A DAY AS NEEDED FOR MUSCLE SPASM   traMADol (ULTRAM) 50 MG tablet Take 1 tablet (50 mg total) by mouth every 12 (twelve) hours as needed for moderate pain or severe pain.   venlafaxine XR (EFFEXOR-XR) 75 MG 24 hr capsule TAKE 1 CAPSULE BY MOUTH DAILY WITH BREAKFAST.   zolpidem (AMBIEN) 10 MG  tablet TAKE 1 TABLET BY MOUTH EVERYDAY AT BEDTIME   No facility-administered encounter medications on file as of 06/18/2023.    Surgical History: Past Surgical History:  Procedure Laterality Date   ABDOMINAL HYSTERECTOMY     CESAREAN SECTION  1989   COLONOSCOPY     COLONOSCOPY N/A 11/28/2021   Procedure: COLONOSCOPY;  Surgeon: Toney Reil, MD;  Location: [facility] ENDOSCOPY;  Service: Gastroenterology;  Laterality: N/A;   CYSTECTOMY  2000   Ovarian   ESOPHAGOGASTRODUODENOSCOPY     OOPHORECTOMY     OVARIAN CYST SURGERY  2006   RECTOVAGINAL FISTULA CLOSURE  1980    Medical History: Past Medical  History:  Diagnosis Date   Anxiety    Arthritis    Depression    Diabetes 1.5, managed as type 2 (HCC)    Hypertension    Insomnia    Migraines    Reflux    Sinusitis     Family History: Family History  Problem Relation Age of Onset   Hodgkin's lymphoma Mother    Diabetes Mother    Thyroid disease Mother    Glaucoma Mother    Stroke Brother    Heart disease Brother     Social History   Socioeconomic History   Marital status: Married    Spouse name: Not on file   Number of children: Not on file   Years of education: Not on file   Highest education level: Not on file  Occupational History   Not on file  Tobacco Use   Smoking status: Former    Current packs/day: 0.00    Types: Cigarettes    Quit date: 05/24/2011    Years since quitting: 12.2   Smokeless tobacco: Never  Vaping Use   Vaping status: Not on file  Substance and Sexual Activity   Alcohol use: No   Drug use: No   Sexual activity: Not on file  Other Topics Concern   Not on file  Social History Narrative   Not on file   Social Determinants of Health   Financial Resource Strain: Not on file  Food Insecurity: Not on file  Transportation Needs: Not on file  Physical Activity: Not on file  Stress: Not on file  Social Connections: Not on file  Intimate Partner Violence: Not on file      Review of Systems  Constitutional:  Negative for chills, fatigue and unexpected weight change.  HENT:  Positive for postnasal drip. Negative for congestion, rhinorrhea, sneezing and sore throat.   Eyes:  Negative for redness.  Respiratory:  Negative for cough, chest tightness and shortness of breath.   Cardiovascular:  Negative for chest pain and palpitations.  Gastrointestinal:  Negative for abdominal pain, constipation, diarrhea, nausea and vomiting.  Genitourinary:  Negative for dysuria and frequency.  Musculoskeletal:  Negative for arthralgias, back pain, joint swelling and neck pain.  Skin:  Negative for  rash.  Neurological: Negative.  Negative for tremors and numbness.  Hematological:  Negative for adenopathy. Does not bruise/bleed easily.  Psychiatric/Behavioral:  Positive for behavioral problems (Depression, improving some) and sleep disturbance (sleeping better). Negative for self-injury and suicidal ideas. The patient is nervous/anxious (improving).     Vital Signs: BP 126/70   Pulse 79   Temp 98.2 F (36.8 C)   Resp 16   Ht 5' 8.5" (1.74 m)   Wt 234 lb (106.1 kg)   SpO2 95%   BMI 35.06 kg/m    Physical Exam Vitals reviewed.  Constitutional:  General: She is not in acute distress.    Appearance: Normal appearance. She is obese. She is not ill-appearing.  HENT:     Head: Normocephalic and atraumatic.  Eyes:     Pupils: Pupils are equal, round, and reactive to light.  Cardiovascular:     Rate and Rhythm: Normal rate and regular rhythm.  Pulmonary:     Effort: Pulmonary effort is normal. No respiratory distress.  Neurological:     Mental Status: She is alert and oriented to person, place, and time.  Psychiatric:        Mood and Affect: Mood normal.        Behavior: Behavior normal.        Assessment/Plan: 1. Chest pain due to myocardial ischemia, unspecified ischemic chest pain type EKG done, results was NSR, nitroglycerin tabs prescribed for chest pain as needed. Ct chest ordered.  - EKG 12-Lead - nitroGLYCERIN (NITROSTAT) 0.4 MG SL tablet; Place 1 tablet (0.4 mg total) under the tongue every 5 (five) minutes as needed for chest pain. X 3 doses, then call 911 if chest pain continues with no relief.  Dispense: 50 tablet; Refill: 3  2. Anterior chest wall pain Ct chest ordered, nitroglycerin prescribed for prn chest pain - nitroGLYCERIN (NITROSTAT) 0.4 MG SL tablet; Place 1 tablet (0.4 mg total) under the tongue every 5 (five) minutes as needed for chest pain. X 3 doses, then call 911 if chest pain continues with no relief.  Dispense: 50 tablet; Refill: 3 - CT  Chest Wo Contrast; Future  3. Shortness of breath CT chest ordered - CT Chest Wo Contrast; Future  4. Type 2 diabetes mellitus with other circulatory complication, without long-term current use of insulin (HCC) Continue medications as prescribed, keep checking sugars and adhering to diet modifications as discussed.    General Counseling: damary hounshell understanding of the findings of todays visit and agrees with plan of treatment. I have discussed any further diagnostic evaluation that may be needed or ordered today. We also reviewed her medications today. she has been encouraged to call the office with any questions or concerns that should arise related to todays visit.    Orders Placed This Encounter  Procedures   CT Chest Wo Contrast   EKG 12-Lead    Meds ordered this encounter  Medications   nitroGLYCERIN (NITROSTAT) 0.4 MG SL tablet    Sig: Place 1 tablet (0.4 mg total) under the tongue every 5 (five) minutes as needed for chest pain. X 3 doses, then call 911 if chest pain continues with no relief.    Dispense:  50 tablet    Refill:  3    Return in about 3 months (around 09/17/2023) for F/U, Recheck A1C, Marrian Bells PCP.   Total time spent:30 Minutes Time spent includes review of chart, medications, test results, and follow up plan with the patient.   [place] Controlled Substance Database was reviewed by me.  This patient was seen by Sallyanne Kuster, FNP-C in collaboration with Dr. Beverely Risen as a part of collaborative care agreement.   Shoua Ressler R. Tedd Sias, MSN, FNP-C Internal medicine

## 2023-07-24 ENCOUNTER — Other Ambulatory Visit: Payer: Self-pay

## 2023-08-03 DIAGNOSIS — M7541 Impingement syndrome of right shoulder: Secondary | ICD-10-CM | POA: Diagnosis not present

## 2023-08-12 ENCOUNTER — Telehealth: Payer: Self-pay | Admitting: Internal Medicine

## 2023-08-12 NOTE — Telephone Encounter (Signed)
Vm not set up. Sent message to patient regarding diabetic eye exam-[name]

## 2023-08-13 ENCOUNTER — Other Ambulatory Visit: Payer: Self-pay | Admitting: Internal Medicine

## 2023-08-13 DIAGNOSIS — E1165 Type 2 diabetes mellitus with hyperglycemia: Secondary | ICD-10-CM

## 2023-08-14 ENCOUNTER — Encounter: Payer: Self-pay | Admitting: Nurse Practitioner

## 2023-08-17 ENCOUNTER — Telehealth: Payer: Self-pay | Admitting: Internal Medicine

## 2023-08-17 NOTE — Telephone Encounter (Signed)
Ophthalmology referral faxed to [facility]; [phone]. Notified patient. Gave pt telephone # [phone]

## 2023-09-17 ENCOUNTER — Other Ambulatory Visit: Payer: Self-pay | Admitting: Nurse Practitioner

## 2023-09-17 DIAGNOSIS — F331 Major depressive disorder, recurrent, moderate: Secondary | ICD-10-CM

## 2023-09-17 DIAGNOSIS — F411 Generalized anxiety disorder: Secondary | ICD-10-CM

## 2023-09-21 ENCOUNTER — Ambulatory Visit (INDEPENDENT_AMBULATORY_CARE_PROVIDER_SITE_OTHER): Payer: Self-pay | Admitting: Nurse Practitioner

## 2023-09-21 ENCOUNTER — Encounter: Payer: Self-pay | Admitting: Nurse Practitioner

## 2023-09-21 VITALS — BP 112/68 | HR 77 | Temp 97.6°F | Resp 16 | Ht 68.5 in | Wt 230.4 lb

## 2023-09-21 DIAGNOSIS — E2839 Other primary ovarian failure: Secondary | ICD-10-CM | POA: Diagnosis not present

## 2023-09-21 DIAGNOSIS — Z0000 Encounter for general adult medical examination without abnormal findings: Secondary | ICD-10-CM

## 2023-09-21 DIAGNOSIS — E785 Hyperlipidemia, unspecified: Secondary | ICD-10-CM

## 2023-09-21 DIAGNOSIS — E1169 Type 2 diabetes mellitus with other specified complication: Secondary | ICD-10-CM

## 2023-09-21 DIAGNOSIS — I152 Hypertension secondary to endocrine disorders: Secondary | ICD-10-CM | POA: Diagnosis not present

## 2023-09-21 DIAGNOSIS — E1159 Type 2 diabetes mellitus with other circulatory complications: Secondary | ICD-10-CM | POA: Diagnosis not present

## 2023-09-21 NOTE — Progress Notes (Cosign Needed)
[facility] [address] [place], [place] [address]  Internal MEDICINE  Office Visit Note  Patient Name: Valerie Ramos  [id number]  [id number]  Date of Service: 09/21/2023  Chief Complaint  Patient presents with   Depression   Diabetes   Hypertension   Medicare Wellness    HPI Maitreyi presents for her initial medicare annual wellness visit. Well-appearing 67 y.o. female with diabetes, anxiety, depression, hypertension, arthritis, insomnia, high cholesterol, perimenopausal symptoms, and migraines.  Routine CRC screening: due in 2028 Routine mammogram: due in march 2025 for mammogram  DEXA scan: due now, ordered.  Pap smear: discontinued Eye exam: has appt with eye doctor on January 6th 2025. -- at [facility].  foot exam: done in August this year  Labs: due for A1c check.  New or worsening pain: left groin, intermittent  Other concerns: none      09/21/2023   10:48 AM  MMSE - Mini Mental State Exam  Orientation to time 5  Orientation to Place 5  Registration 3  Attention/ Calculation 5  Recall 3  Language- name 2 objects 2  Language- repeat 1  Language- follow 3 step command 3  Language- read & follow direction 1  Write a sentence 0  Copy design 1  Total score 29    Functional Status Survey: Is the patient deaf or have difficulty hearing?: No Does the patient have difficulty seeing, even when wearing glasses/contacts?: No Does the patient have difficulty concentrating, remembering, or making decisions?: No Does the patient have difficulty walking or climbing stairs?: No Does the patient have difficulty dressing or bathing?: No Does the patient have difficulty doing errands alone such as visiting a doctor's office or shopping?: No     05/15/2022    2:59 PM 06/12/2022    4:11 PM 10/22/2022   11:23 AM 05/18/2023    3:01 PM 09/21/2023   10:47 AM  Fall Risk  Falls in the past year? 0 0 0 0 0  Was there an injury with Fall?  0 0 0 0  Fall Risk  Category Calculator  0 0 0 0  Fall Risk Category (Retired)  Low     (RETIRED) Patient Fall Risk Level Low fall risk Low fall risk     Patient at Risk for Falls Due to No Fall Risks No Fall Risks No Fall Risks No Fall Risks No Fall Risks  Fall risk Follow up Falls evaluation completed Falls evaluation completed Falls evaluation completed Falls evaluation completed Falls evaluation completed       09/21/2023   12:55 PM  Depression screen PHQ 2/9  Decreased Interest 0  Down, Depressed, Hopeless 1  PHQ - 2 Score 1        Current Medication: Outpatient Encounter Medications as of 09/21/2023  Medication Sig   acetaminophen (TYLENOL) 325 MG tablet Take 2 tablets (650 mg total) by mouth every 6 (six) hours as needed for mild pain (or Fever >/= 101).   atenolol (TENORMIN) 25 MG tablet TAKE 1 TABLET BY MOUTH AT BEDTIME FOR BLOOD PRESSURE   Dulaglutide 3 MG/0.5ML SOPN Inject 3 mg into the skin once a week.   estradiol (ESTRACE) 0.5 MG tablet Take 1 tablet (0.5 mg total) by mouth daily.   fluticasone (FLONASE) 50 MCG/ACT nasal spray SPRAY 2 SPRAYS INTO EACH NOSTRIL EVERY DAY   gabapentin (NEURONTIN) 300 MG capsule Take 1 capsule (300 mg total) by mouth at bedtime.   glimepiride (AMARYL) 1 MG tablet Take 1  tablet by mouth daily with breakfast and dinner.   hydrochlorothiazide (HYDRODIURIL) 25 MG tablet TAKE 1 TABLET (25 MG TOTAL) BY MOUTH DAILY.   Lancets (ONETOUCH DELICA PLUS LANCET30G) MISC 1 EACH BY DOES NOT APPLY ROUTE DAILY. USE AS DIRECTED ONCE DAILY DIAG E11.65   losartan (COZAAR) 100 MG tablet Take 1 tablet (100 mg total) by mouth daily.   metoCLOPramide (REGLAN) 10 MG tablet Take 1 tablet (10 mg total) by mouth 3 (three) times daily before meals.   nitroGLYCERIN (NITROSTAT) 0.4 MG SL tablet Place 1 tablet (0.4 mg total) under the tongue every 5 (five) minutes as needed for chest pain. X 3 doses, then call 911 if chest pain continues with no relief.   ONETOUCH ULTRA test strip FOR ONCE  DAILY TESTING DX. E11.65   oxybutynin (DITROPAN-XL) 10 MG 24 hr tablet TAKE 1 TABLET BY MOUTH EVERYDAY AT BEDTIME   pantoprazole (PROTONIX) 40 MG tablet Take 1 tablet (40 mg total) by mouth daily.   PNEUMOCOCCAL 20-VAL CONJ VACC IM Inject into the muscle.   rosuvastatin (CRESTOR) 5 MG tablet TAKE 1 TABLET (5 MG TOTAL) BY MOUTH DAILY.   SUMAtriptan (IMITREX) 50 MG tablet TAKE 1 TABLET EVERY 2 HOURS AS NEEDED FOR MIGRAINE. MAY REPEAT IN 2 HOURS IF HEADACHE PERSISTS OR RECURS. LIMIT TO 2 DOSES PER 24 HOURS   tiZANidine (ZANAFLEX) 2 MG tablet TAKE 1 TABLET (2MG  TOTAL) BY MOUTH TWICE A DAY AS NEEDED FOR MUSCLE SPASM   traMADol (ULTRAM) 50 MG tablet Take 1 tablet (50 mg total) by mouth every 12 (twelve) hours as needed for moderate pain or severe pain.   venlafaxine XR (EFFEXOR-XR) 75 MG 24 hr capsule TAKE 1 CAPSULE BY MOUTH DAILY WITH BREAKFAST.   zolpidem (AMBIEN) 10 MG tablet TAKE 1 TABLET BY MOUTH EVERYDAY AT BEDTIME   No facility-administered encounter medications on file as of 09/21/2023.    Surgical History: Past Surgical History:  Procedure Laterality Date   ABDOMINAL HYSTERECTOMY     CESAREAN SECTION  1989   COLONOSCOPY     COLONOSCOPY N/A 11/28/2021   Procedure: COLONOSCOPY;  Surgeon: Toney Reil, MD;  Location: [facility] ENDOSCOPY;  Service: Gastroenterology;  Laterality: N/A;   CYSTECTOMY  2000   Ovarian   ESOPHAGOGASTRODUODENOSCOPY     OOPHORECTOMY     OVARIAN CYST SURGERY  2006   RECTOVAGINAL FISTULA CLOSURE  1980    Medical History: Past Medical History:  Diagnosis Date   Anxiety    Arthritis    Depression    Diabetes 1.5, managed as type 2 (HCC)    Hypertension    Insomnia    Migraines    Reflux    Sinusitis     Family History: Family History  Problem Relation Age of Onset   Hodgkin's lymphoma Mother    Diabetes Mother    Thyroid disease Mother    Glaucoma Mother    Stroke Brother    Heart disease Brother     Social History   Socioeconomic History    Marital status: Married    Spouse name: Not on file   Number of children: Not on file   Years of education: Not on file   Highest education level: Not on file  Occupational History   Not on file  Tobacco Use   Smoking status: Former    Current packs/day: 0.00    Types: Cigarettes    Quit date: 05/24/2011    Years since quitting: 12.3   Smokeless tobacco: Never  Vaping Use   Vaping status: Not on file  Substance and Sexual Activity   Alcohol use: No   Drug use: No   Sexual activity: Not on file  Other Topics Concern   Not on file  Social History Narrative   Not on file   Social Drivers of Health   Financial Resource Strain: Not on file  Food Insecurity: Not on file  Transportation Needs: Not on file  Physical Activity: Not on file  Stress: Not on file  Social Connections: Not on file  Intimate Partner Violence: Not on file      Review of Systems  Constitutional:  Negative for chills, fatigue and unexpected weight change.  HENT:  Negative for congestion, postnasal drip, rhinorrhea, sneezing and sore throat.   Eyes:  Negative for redness.  Respiratory:  Negative for cough, chest tightness and shortness of breath.   Cardiovascular:  Negative for chest pain and palpitations.  Gastrointestinal:  Negative for abdominal pain, constipation, diarrhea, nausea and vomiting.  Genitourinary:  Negative for dysuria and frequency.  Musculoskeletal:  Negative for arthralgias, back pain, joint swelling and neck pain.  Skin:  Negative for rash.  Neurological: Negative.  Negative for tremors and numbness.  Hematological:  Negative for adenopathy. Does not bruise/bleed easily.  Psychiatric/Behavioral:  Positive for behavioral problems (Depression) and sleep disturbance. Negative for decreased concentration, dysphoric mood, self-injury and suicidal ideas. The patient is nervous/anxious.     Vital Signs: BP 112/68   Pulse 77   Temp 97.6 F (36.4 C)   Resp 16   Ht 5' 8.5" (1.74 m)    Wt 230 lb 6.4 oz (104.5 kg)   SpO2 97%   BMI 34.52 kg/m    Physical Exam Vitals reviewed.  Constitutional:      General: She is not in acute distress.    Appearance: Normal appearance. She is obese. She is not ill-appearing.  HENT:     Head: Normocephalic and atraumatic.  Eyes:     Pupils: Pupils are equal, round, and reactive to light.  Cardiovascular:     Rate and Rhythm: Normal rate and regular rhythm.  Pulmonary:     Effort: No respiratory distress.  Neurological:     Mental Status: She is alert and oriented to person, place, and time.  Psychiatric:        Mood and Affect: Mood normal.        Behavior: Behavior normal.        Assessment/Plan: 1. Encounter for initial annual wellness visit (AWV) in Medicare patient (Primary) Age-appropriate preventive screenings and vaccinations discussed, annual physical exam completed. Routine labs for health maintenance results previously discussed, requisition form provided for A1c lab. PHM updated.    2. Type 2 diabetes mellitus with other circulatory complication, without long-term current use of insulin (HCC) Will have lab done for A1c soon. Other labs were done recently in August. Urine sent for microalbumin/creatinine ratio. Glucose has been improving. Continue trulicity and glimepiride - Urine Microalbumin w/creat. ratio  3. Hypertension associated with type 2 diabetes mellitus (HCC) Continue atenolol and losartan as prescribed.   4. Hyperlipidemia associated with type 2 diabetes mellitus (HCC) Continue rosuvastatin as prescribed.   5. Ovarian failure due to menopause Dexa scan ordered  - DG Bone Density; Future     General Counseling: moniya meditz understanding of the findings of todays visit and agrees with plan of treatment. I have discussed any further diagnostic evaluation that may be needed or ordered today. We also reviewed  her medications today. she has been encouraged to call the office with any  questions or concerns that should arise related to todays visit.    Orders Placed This Encounter  Procedures   DG Bone Density   Urine Microalbumin w/creat. ratio    No orders of the defined types were placed in this encounter.   Return in about 3 months (around 12/15/2023) for F/U, med refill, Lorely Bubb PCP.   Total time spent:30 Minutes Time spent includes review of chart, medications, test results, and follow up plan with the patient.   [place] Controlled Substance Database was reviewed by me.  This patient was seen by Sallyanne Kuster, FNP-C in collaboration with Dr. Beverely Risen as a part of collaborative care agreement.  Neill Jurewicz R. Tedd Sias, MSN, FNP-C Internal medicine

## 2023-09-22 LAB — MICROALBUMIN / CREATININE URINE RATIO
Creatinine, Urine: 183.7 mg/dL
Microalb/Creat Ratio: 7 mg/g{creat} (ref 0–29)
Microalbumin, Urine: 12.4 ug/mL

## 2023-10-05 DIAGNOSIS — H2513 Age-related nuclear cataract, bilateral: Secondary | ICD-10-CM | POA: Diagnosis not present

## 2023-10-05 DIAGNOSIS — E119 Type 2 diabetes mellitus without complications: Secondary | ICD-10-CM | POA: Diagnosis not present

## 2023-10-05 DIAGNOSIS — H5015 Alternating exotropia: Secondary | ICD-10-CM | POA: Diagnosis not present

## 2023-10-08 ENCOUNTER — Other Ambulatory Visit: Payer: Self-pay | Admitting: Nurse Practitioner

## 2023-10-08 DIAGNOSIS — Z79899 Other long term (current) drug therapy: Secondary | ICD-10-CM

## 2023-10-08 NOTE — Telephone Encounter (Signed)
 Last 09/21/23

## 2023-10-23 ENCOUNTER — Other Ambulatory Visit: Payer: Self-pay | Admitting: Nurse Practitioner

## 2023-10-23 DIAGNOSIS — Z79899 Other long term (current) drug therapy: Secondary | ICD-10-CM

## 2023-10-31 ENCOUNTER — Other Ambulatory Visit: Payer: Self-pay | Admitting: Nurse Practitioner

## 2023-10-31 DIAGNOSIS — Z79899 Other long term (current) drug therapy: Secondary | ICD-10-CM

## 2023-11-22 ENCOUNTER — Other Ambulatory Visit: Payer: Self-pay | Admitting: Nurse Practitioner

## 2023-11-22 DIAGNOSIS — Z79899 Other long term (current) drug therapy: Secondary | ICD-10-CM

## 2023-11-30 ENCOUNTER — Telehealth (INDEPENDENT_AMBULATORY_CARE_PROVIDER_SITE_OTHER): Admitting: Nurse Practitioner

## 2023-11-30 VITALS — Resp 16 | Ht 68.5 in | Wt 230.0 lb

## 2023-11-30 DIAGNOSIS — R062 Wheezing: Secondary | ICD-10-CM

## 2023-11-30 DIAGNOSIS — R051 Acute cough: Secondary | ICD-10-CM | POA: Diagnosis not present

## 2023-11-30 DIAGNOSIS — J0100 Acute maxillary sinusitis, unspecified: Secondary | ICD-10-CM

## 2023-11-30 MED ORDER — BENZONATATE 200 MG PO CAPS: 200.0000 mg | ORAL_CAPSULE | Freq: Three times a day (TID) | ORAL | 0 refills | Status: DC | PRN

## 2023-11-30 MED ORDER — AZITHROMYCIN 250 MG PO TABS
ORAL_TABLET | ORAL | 0 refills | Status: AC
Start: 2023-11-30 — End: 2023-12-05

## 2023-11-30 MED ORDER — PREDNISONE 10 MG (21) PO TBPK
ORAL_TABLET | ORAL | 0 refills | Status: DC
Start: 2023-11-30 — End: 2023-12-21

## 2023-11-30 NOTE — Progress Notes (Signed)
 [facility] [address] [place], [place] [address]  Internal MEDICINE  Telephone Visit  Patient Name: Valerie Ramos  [id number]  [id number]  Date of Service: 11/30/2023  I connected with the patient at 0915 by telephone and verified the patients identity using two identifiers.   I discussed the limitations, risks, security and privacy concerns of performing an evaluation and management service by telephone and the availability of in person appointments. I also discussed with the patient that there may be a patient responsible charge related to the service.  The patient expressed understanding and agrees to proceed.    Chief Complaint  Patient presents with   Telephone Screen    Post flu--- coughing up green phelghm, runny nose   Telephone Assessment    HPI Valerie Ramos presents for a telehealth virtual visit for possible recent flu and continued symptoms Onset of symptoms was last Thursday  Reports green sinus drainage, runny nose, cough, nasal congestion, fatigue, weakness, SOB with exertion, activity intolerance. Fever has resolved.    Current Medication: Outpatient Encounter Medications as of 11/30/2023  Medication Sig   acetaminophen (TYLENOL) 325 MG tablet Take 2 tablets (650 mg total) by mouth every 6 (six) hours as needed for mild pain (or Fever >/= 101).   atenolol (TENORMIN) 25 MG tablet TAKE 1 TABLET BY MOUTH AT BEDTIME FOR BLOOD PRESSURE   azithromycin (ZITHROMAX) 250 MG tablet Take 2 tablets on day 1, then 1 tablet daily on days 2 through 5   benzonatate (TESSALON) 200 MG capsule Take 1 capsule (200 mg total) by mouth 3 (three) times daily as needed for cough.   Dulaglutide 3 MG/0.5ML SOPN Inject 3 mg into the skin once a week.   estradiol (ESTRACE) 0.5 MG tablet Take 1 tablet (0.5 mg total) by mouth daily.   fluticasone (FLONASE) 50 MCG/ACT nasal spray SPRAY 2 SPRAYS INTO EACH NOSTRIL EVERY DAY   gabapentin (NEURONTIN) 300 MG capsule Take 1 capsule (300 mg total) by  mouth at bedtime.   glimepiride (AMARYL) 1 MG tablet Take 1 tablet by mouth daily with breakfast and dinner.   hydrochlorothiazide (HYDRODIURIL) 25 MG tablet TAKE 1 TABLET (25 MG TOTAL) BY MOUTH DAILY.   Lancets (ONETOUCH DELICA PLUS LANCET30G) MISC 1 EACH BY DOES NOT APPLY ROUTE DAILY. USE AS DIRECTED ONCE DAILY DIAG E11.65   losartan (COZAAR) 100 MG tablet Take 1 tablet (100 mg total) by mouth daily.   metoCLOPramide (REGLAN) 10 MG tablet Take 1 tablet (10 mg total) by mouth 3 (three) times daily before meals.   nitroGLYCERIN (NITROSTAT) 0.4 MG SL tablet Place 1 tablet (0.4 mg total) under the tongue every 5 (five) minutes as needed for chest pain. X 3 doses, then call 911 if chest pain continues with no relief.   ONETOUCH ULTRA test strip FOR ONCE DAILY TESTING DX. E11.65   oxybutynin (DITROPAN-XL) 10 MG 24 hr tablet TAKE 1 TABLET BY MOUTH EVERYDAY AT BEDTIME   pantoprazole (PROTONIX) 40 MG tablet Take 1 tablet (40 mg total) by mouth daily.   PNEUMOCOCCAL 20-VAL CONJ VACC IM Inject into the muscle.   predniSONE (STERAPRED UNI-PAK 21 TAB) 10 MG (21) TBPK tablet Use as directed for 6 days   rosuvastatin (CRESTOR) 5 MG tablet TAKE 1 TABLET (5 MG TOTAL) BY MOUTH DAILY.   SUMAtriptan (IMITREX) 50 MG tablet TAKE 1 TABLET EVERY 2 HOURS AS NEEDED FOR MIGRAINE. MAY REPEAT IN 2 HOURS IF HEADACHE PERSISTS OR RECURS. LIMIT TO 2 DOSES PER 24 HOURS  tiZANidine (ZANAFLEX) 2 MG tablet TAKE 1 TABLET (2MG  TOTAL) BY MOUTH TWICE A DAY AS NEEDED FOR MUSCLE SPASM   traMADol (ULTRAM) 50 MG tablet Take 1 tablet (50 mg total) by mouth every 12 (twelve) hours as needed for moderate pain or severe pain.   venlafaxine XR (EFFEXOR-XR) 75 MG 24 hr capsule TAKE 1 CAPSULE BY MOUTH DAILY WITH BREAKFAST.   zolpidem (AMBIEN) 10 MG tablet TAKE 1 TABLET BY MOUTH EVERYDAY AT BEDTIME   No facility-administered encounter medications on file as of 11/30/2023.    Surgical History: Past Surgical History:  Procedure Laterality Date    ABDOMINAL HYSTERECTOMY     CESAREAN SECTION  1989   COLONOSCOPY     COLONOSCOPY N/A 11/28/2021   Procedure: COLONOSCOPY;  Surgeon: Toney Reil, MD;  Location: [facility] ENDOSCOPY;  Service: Gastroenterology;  Laterality: N/A;   CYSTECTOMY  2000   Ovarian   ESOPHAGOGASTRODUODENOSCOPY     OOPHORECTOMY     OVARIAN CYST SURGERY  2006   RECTOVAGINAL FISTULA CLOSURE  1980    Medical History: Past Medical History:  Diagnosis Date   Anxiety    Arthritis    Depression    Diabetes 1.5, managed as type 2 (HCC)    Hypertension    Insomnia    Migraines    Reflux    Sinusitis     Family History: Family History  Problem Relation Age of Onset   Hodgkin's lymphoma Mother    Diabetes Mother    Thyroid disease Mother    Glaucoma Mother    Stroke Brother    Heart disease Brother     Social History   Socioeconomic History   Marital status: Married    Spouse name: Not on file   Number of children: Not on file   Years of education: Not on file   Highest education level: Not on file  Occupational History   Not on file  Tobacco Use   Smoking status: Former    Current packs/day: 0.00    Types: Cigarettes    Quit date: 05/24/2011    Years since quitting: 12.5   Smokeless tobacco: Never  Vaping Use   Vaping status: Not on file  Substance and Sexual Activity   Alcohol use: No   Drug use: No   Sexual activity: Not on file  Other Topics Concern   Not on file  Social History Narrative   Not on file   Social Drivers of Health   Financial Resource Strain: Not on file  Food Insecurity: Not on file  Transportation Needs: Not on file  Physical Activity: Not on file  Stress: Not on file  Social Connections: Not on file  Intimate Partner Violence: Not on file      Review of Systems  Constitutional:  Positive for appetite change, chills, fatigue and fever.  HENT:  Positive for congestion, ear pain, postnasal drip, rhinorrhea, sinus pressure, sinus pain, sneezing and sore  throat.   Respiratory:  Positive for cough, chest tightness, shortness of breath and wheezing.   Cardiovascular: Negative.  Negative for chest pain and palpitations.  Gastrointestinal:  Negative for diarrhea, nausea and vomiting.  Musculoskeletal:  Negative for myalgias.  Neurological:  Positive for headaches.    Vital Signs: Resp 16   Ht 5' 8.5" (1.74 m)   Wt 230 lb (104.3 kg)   BMI 34.46 kg/m    Observation/Objective: She is alert and oriented. No acute distress noted.     Assessment/Plan: 1. Acute non-recurrent  maxillary sinusitis (Primary) 5 day zpak and prednisone taper prescribed, take until gone  - azithromycin (ZITHROMAX) 250 MG tablet; Take 2 tablets on day 1, then 1 tablet daily on days 2 through 5  Dispense: 6 tablet; Refill: 0 - predniSONE (STERAPRED UNI-PAK 21 TAB) 10 MG (21) TBPK tablet; Use as directed for 6 days  Dispense: 21 tablet; Refill: 0 - benzonatate (TESSALON) 200 MG capsule; Take 1 capsule (200 mg total) by mouth 3 (three) times daily as needed for cough.  Dispense: 30 capsule; Refill: 0  2. Acute cough Cough medication prescribed for symptom relief - benzonatate (TESSALON) 200 MG capsule; Take 1 capsule (200 mg total) by mouth 3 (three) times daily as needed for cough.  Dispense: 30 capsule; Refill: 0  3. Wheezing Prednisone taper prescribed. - predniSONE (STERAPRED UNI-PAK 21 TAB) 10 MG (21) TBPK tablet; Use as directed for 6 days  Dispense: 21 tablet; Refill: 0   General Counseling: Letitia Neri understanding of the findings of today's phone visit and agrees with plan of treatment. I have discussed any further diagnostic evaluation that may be needed or ordered today. We also reviewed her medications today. she has been encouraged to call the office with any questions or concerns that should arise related to todays visit.  Return if symptoms worsen or fail to improve, for keep next scheduled regular follow up .   No orders of the defined types  were placed in this encounter.   Meds ordered this encounter  Medications   azithromycin (ZITHROMAX) 250 MG tablet    Sig: Take 2 tablets on day 1, then 1 tablet daily on days 2 through 5    Dispense:  6 tablet    Refill:  0    Fill new script today   predniSONE (STERAPRED UNI-PAK 21 TAB) 10 MG (21) TBPK tablet    Sig: Use as directed for 6 days    Dispense:  21 tablet    Refill:  0    Fill new script today   benzonatate (TESSALON) 200 MG capsule    Sig: Take 1 capsule (200 mg total) by mouth 3 (three) times daily as needed for cough.    Dispense:  30 capsule    Refill:  0    Use goodrx if insurance will not cover, fill new script today    Time spent:20 Minutes Time spent with patient included reviewing progress notes, labs, imaging studies, and discussing plan for follow up.  [place] Controlled Substance Database was reviewed by me for overdose risk score (ORS) if appropriate.  This patient was seen by Sallyanne Kuster, FNP-C in collaboration with Dr. Beverely Risen as a part of collaborative care agreement.  Waldron Gerry R. Tedd Sias, MSN, FNP-C Internal medicine

## 2023-12-03 ENCOUNTER — Other Ambulatory Visit: Payer: Self-pay | Admitting: Nurse Practitioner

## 2023-12-03 DIAGNOSIS — Z79899 Other long term (current) drug therapy: Secondary | ICD-10-CM

## 2023-12-04 ENCOUNTER — Other Ambulatory Visit: Payer: Self-pay | Admitting: Nurse Practitioner

## 2023-12-04 DIAGNOSIS — E1165 Type 2 diabetes mellitus with hyperglycemia: Secondary | ICD-10-CM | POA: Diagnosis not present

## 2023-12-05 ENCOUNTER — Encounter: Payer: Self-pay | Admitting: Nurse Practitioner

## 2023-12-05 LAB — HGB A1C W/O EAG: Hgb A1c MFr Bld: 6.6 % — ABNORMAL HIGH (ref 4.8–5.6)

## 2023-12-11 ENCOUNTER — Other Ambulatory Visit: Payer: Self-pay | Admitting: Nurse Practitioner

## 2023-12-11 DIAGNOSIS — Z1231 Encounter for screening mammogram for malignant neoplasm of breast: Secondary | ICD-10-CM

## 2023-12-21 ENCOUNTER — Other Ambulatory Visit: Payer: Self-pay | Admitting: Nurse Practitioner

## 2023-12-21 ENCOUNTER — Encounter: Payer: Self-pay | Admitting: Nurse Practitioner

## 2023-12-21 ENCOUNTER — Ambulatory Visit (INDEPENDENT_AMBULATORY_CARE_PROVIDER_SITE_OTHER): Payer: Medicare Other | Admitting: Nurse Practitioner

## 2023-12-21 VITALS — BP 118/70 | HR 83 | Temp 98.1°F | Resp 16 | Ht 68.5 in | Wt 230.4 lb

## 2023-12-21 DIAGNOSIS — N393 Stress incontinence (female) (male): Secondary | ICD-10-CM

## 2023-12-21 DIAGNOSIS — K219 Gastro-esophageal reflux disease without esophagitis: Secondary | ICD-10-CM

## 2023-12-21 DIAGNOSIS — E785 Hyperlipidemia, unspecified: Secondary | ICD-10-CM

## 2023-12-21 DIAGNOSIS — F5101 Primary insomnia: Secondary | ICD-10-CM

## 2023-12-21 DIAGNOSIS — I152 Hypertension secondary to endocrine disorders: Secondary | ICD-10-CM

## 2023-12-21 DIAGNOSIS — N951 Menopausal and female climacteric states: Secondary | ICD-10-CM

## 2023-12-21 DIAGNOSIS — G43009 Migraine without aura, not intractable, without status migrainosus: Secondary | ICD-10-CM | POA: Diagnosis not present

## 2023-12-21 DIAGNOSIS — E1169 Type 2 diabetes mellitus with other specified complication: Secondary | ICD-10-CM

## 2023-12-21 DIAGNOSIS — E1165 Type 2 diabetes mellitus with hyperglycemia: Secondary | ICD-10-CM

## 2023-12-21 DIAGNOSIS — E1159 Type 2 diabetes mellitus with other circulatory complications: Secondary | ICD-10-CM | POA: Diagnosis not present

## 2023-12-21 DIAGNOSIS — Z79899 Other long term (current) drug therapy: Secondary | ICD-10-CM

## 2023-12-21 MED ORDER — ROSUVASTATIN CALCIUM 5 MG PO TABS: 5.0000 mg | ORAL_TABLET | Freq: Every day | ORAL | 3 refills | Status: AC

## 2023-12-21 MED ORDER — PANTOPRAZOLE SODIUM 40 MG PO TBEC: 40.0000 mg | DELAYED_RELEASE_TABLET | Freq: Every day | ORAL | 3 refills | Status: AC

## 2023-12-21 MED ORDER — GLIMEPIRIDE 1 MG PO TABS
ORAL_TABLET | ORAL | 5 refills | Status: DC
Start: 2023-12-21 — End: 2024-01-12

## 2023-12-21 MED ORDER — METOCLOPRAMIDE HCL 10 MG PO TABS: 10.0000 mg | ORAL_TABLET | Freq: Three times a day (TID) | ORAL | 5 refills | Status: DC

## 2023-12-21 MED ORDER — HYDROCHLOROTHIAZIDE 25 MG PO TABS: 25.0000 mg | ORAL_TABLET | Freq: Every day | ORAL | 3 refills | Status: AC

## 2023-12-21 MED ORDER — SUMATRIPTAN SUCCINATE 50 MG PO TABS
ORAL_TABLET | ORAL | 3 refills | Status: DC
Start: 2023-12-21 — End: 2024-06-06

## 2023-12-21 MED ORDER — ZOLPIDEM TARTRATE 10 MG PO TABS
ORAL_TABLET | ORAL | 2 refills | Status: DC
Start: 2023-12-21 — End: 2024-03-18

## 2023-12-21 MED ORDER — ATENOLOL 25 MG PO TABS
ORAL_TABLET | ORAL | 3 refills | Status: AC
Start: 2023-12-21 — End: ?

## 2023-12-21 MED ORDER — OXYBUTYNIN CHLORIDE ER 10 MG PO TB24: ORAL_TABLET | ORAL | 3 refills | Status: AC

## 2023-12-21 MED ORDER — ESTRADIOL 0.5 MG PO TABS
0.5000 mg | ORAL_TABLET | Freq: Every day | ORAL | 5 refills | Status: DC
Start: 2023-12-21 — End: 2024-03-18

## 2023-12-21 NOTE — Progress Notes (Signed)
 [facility] [address] [place], [place] [address]  Internal MEDICINE  Office Visit Note  Patient Name: Valerie Ramos  [id number]  [id number]  Date of Service: 12/21/2023  Chief Complaint  Patient presents with   Diabetes   Depression   Hypertension   Follow-up    HPI Hadli presents for a follow-up visit for diabetes, insomnia, hypertension, high cholesterol, GERD and vasomotor symptoms.  Diabetes -- A1c improving but trulicity  is too expensive.  Insomnia -- taking ambien  as needed.  Hypertension -- controlled with current medications  High cholesterol -- on statin therapy GERD -- controlled with pantoprazole  and metoclopramide .  Vasomotor symptoms of menopause -- on HRT with estradiol  and taking venlafaxine  75 mg daily.     Current Medication: Outpatient Encounter Medications as of 12/21/2023  Medication Sig   acetaminophen  (TYLENOL ) 325 MG tablet Take 2 tablets (650 mg total) by mouth every 6 (six) hours as needed for mild pain (or Fever >/= 101).   atenolol  (TENORMIN ) 25 MG tablet TAKE 1 TABLET BY MOUTH AT BEDTIME FOR BLOOD PRESSURE   Dulaglutide  (TRULICITY ) 3 MG/0.5ML SOAJ INJECT 3 MG INTO THE SKIN ONE TIME PER WEEK   estradiol  (ESTRACE ) 0.5 MG tablet Take 1 tablet (0.5 mg total) by mouth daily.   fluticasone  (FLONASE ) 50 MCG/ACT nasal spray SPRAY 2 SPRAYS INTO EACH NOSTRIL EVERY DAY   gabapentin  (NEURONTIN ) 300 MG capsule TAKE 1 CAPSULE BY MOUTH EVERYDAY AT BEDTIME   glimepiride  (AMARYL ) 1 MG tablet Take 1 tablet by mouth daily with breakfast and dinner.   hydrochlorothiazide  (HYDRODIURIL ) 25 MG tablet Take 1 tablet (25 mg total) by mouth daily.   Lancets (ONETOUCH DELICA PLUS LANCET30G) MISC 1 EACH BY DOES NOT APPLY ROUTE DAILY. USE AS DIRECTED ONCE DAILY DIAG E11.65   losartan  (COZAAR ) 100 MG tablet Take 1 tablet (100 mg total) by mouth daily.   metoCLOPramide  (REGLAN ) 10 MG tablet Take 1 tablet (10 mg total) by mouth 3 (three) times daily before meals.    nitroGLYCERIN  (NITROSTAT ) 0.4 MG SL tablet Place 1 tablet (0.4 mg total) under the tongue every 5 (five) minutes as needed for chest pain. X 3 doses, then call 911 if chest pain continues with no relief.   ONETOUCH ULTRA test strip FOR ONCE DAILY TESTING DX. E11.65   oxybutynin  (DITROPAN -XL) 10 MG 24 hr tablet TAKE 1 TABLET BY MOUTH EVERYDAY AT BEDTIME   pantoprazole  (PROTONIX ) 40 MG tablet Take 1 tablet (40 mg total) by mouth daily.   PNEUMOCOCCAL 20-VAL CONJ VACC IM Inject into the muscle.   rosuvastatin  (CRESTOR ) 5 MG tablet Take 1 tablet (5 mg total) by mouth daily.   SUMAtriptan  (IMITREX ) 50 MG tablet TAKE 1 TABLET EVERY 2 HOURS AS NEEDED FOR MIGRAINE. MAY REPEAT IN 2 HOURS IF HEADACHE PERSISTS OR RECURS. LIMIT TO 2 DOSES PER 24 HOURS   tiZANidine  (ZANAFLEX ) 2 MG tablet TAKE 1 TABLET (2MG  TOTAL) BY MOUTH TWICE A DAY AS NEEDED FOR MUSCLE SPASM   traMADol  (ULTRAM ) 50 MG tablet Take 1 tablet (50 mg total) by mouth every 12 (twelve) hours as needed for moderate pain or severe pain.   venlafaxine  XR (EFFEXOR -XR) 75 MG 24 hr capsule TAKE 1 CAPSULE BY MOUTH DAILY WITH BREAKFAST.   zolpidem  (AMBIEN ) 10 MG tablet TAKE 1 TABLET BY MOUTH EVERYDAY AT BEDTIME   [DISCONTINUED] atenolol  (TENORMIN ) 25 MG tablet TAKE 1 TABLET BY MOUTH AT BEDTIME FOR BLOOD PRESSURE   [DISCONTINUED] benzonatate  (TESSALON ) 200 MG capsule Take 1 capsule (200  mg total) by mouth 3 (three) times daily as needed for cough.   [DISCONTINUED] estradiol  (ESTRACE ) 0.5 MG tablet Take 1 tablet (0.5 mg total) by mouth daily.   [DISCONTINUED] glimepiride  (AMARYL ) 1 MG tablet Take 1 tablet by mouth daily with breakfast and dinner.   [DISCONTINUED] hydrochlorothiazide  (HYDRODIURIL ) 25 MG tablet TAKE 1 TABLET (25 MG TOTAL) BY MOUTH DAILY.   [DISCONTINUED] metoCLOPramide  (REGLAN ) 10 MG tablet Take 1 tablet (10 mg total) by mouth 3 (three) times daily before meals.   [DISCONTINUED] oxybutynin  (DITROPAN -XL) 10 MG 24 hr tablet TAKE 1 TABLET BY MOUTH  EVERYDAY AT BEDTIME   [DISCONTINUED] pantoprazole  (PROTONIX ) 40 MG tablet Take 1 tablet (40 mg total) by mouth daily.   [DISCONTINUED] predniSONE  (STERAPRED UNI-PAK 21 TAB) 10 MG (21) TBPK tablet Use as directed for 6 days   [DISCONTINUED] rosuvastatin  (CRESTOR ) 5 MG tablet TAKE 1 TABLET (5 MG TOTAL) BY MOUTH DAILY.   [DISCONTINUED] SUMAtriptan  (IMITREX ) 50 MG tablet TAKE 1 TABLET EVERY 2 HOURS AS NEEDED FOR MIGRAINE. MAY REPEAT IN 2 HOURS IF HEADACHE PERSISTS OR RECURS. LIMIT TO 2 DOSES PER 24 HOURS   [DISCONTINUED] zolpidem  (AMBIEN ) 10 MG tablet TAKE 1 TABLET BY MOUTH EVERYDAY AT BEDTIME   No facility-administered encounter medications on file as of 12/21/2023.    Surgical History: Past Surgical History:  Procedure Laterality Date   ABDOMINAL HYSTERECTOMY     CESAREAN SECTION  1989   COLONOSCOPY     COLONOSCOPY N/A 11/28/2021   Procedure: COLONOSCOPY;  Surgeon: Selena Daily, MD;  Location: [facility] ENDOSCOPY;  Service: Gastroenterology;  Laterality: N/A;   CYSTECTOMY  2000   Ovarian   ESOPHAGOGASTRODUODENOSCOPY     OOPHORECTOMY     OVARIAN CYST SURGERY  2006   RECTOVAGINAL FISTULA CLOSURE  1980    Medical History: Past Medical History:  Diagnosis Date   Anxiety    Arthritis    Depression    Diabetes 1.5, managed as type 2 (HCC)    Hypertension    Insomnia    Migraines    Reflux    Sinusitis     Family History: Family History  Problem Relation Age of Onset   Hodgkin's lymphoma Mother    Diabetes Mother    Thyroid  disease Mother    Glaucoma Mother    Stroke Brother    Heart disease Brother     Social History   Socioeconomic History   Marital status: Married    Spouse name: Not on file   Number of children: Not on file   Years of education: Not on file   Highest education level: Not on file  Occupational History   Not on file  Tobacco Use   Smoking status: Former    Current packs/day: 0.00    Types: Cigarettes    Quit date: 05/24/2011    Years since  quitting: 12.5   Smokeless tobacco: Never  Vaping Use   Vaping status: Not on file  Substance and Sexual Activity   Alcohol use: No   Drug use: No   Sexual activity: Not on file  Other Topics Concern   Not on file  Social History Narrative   Not on file   Social Drivers of Health   Financial Resource Strain: Not on file  Food Insecurity: Not on file  Transportation Needs: Not on file  Physical Activity: Not on file  Stress: Not on file  Social Connections: Not on file  Intimate Partner Violence: Not on file  Review of Systems  Constitutional:  Positive for fatigue. Negative for chills and unexpected weight change.  HENT:  Positive for postnasal drip. Negative for congestion, rhinorrhea, sneezing and sore throat.   Eyes:  Negative for redness.  Respiratory: Negative.  Negative for cough, chest tightness, shortness of breath and wheezing.   Cardiovascular: Negative.  Negative for chest pain and palpitations.  Gastrointestinal: Negative.  Negative for abdominal pain, constipation, diarrhea, nausea and vomiting.  Genitourinary:  Negative for dysuria and frequency.  Musculoskeletal:  Negative for arthralgias, back pain, joint swelling and neck pain.  Skin:  Negative for rash.  Neurological: Negative.  Negative for tremors and numbness.  Hematological:  Negative for adenopathy. Does not bruise/bleed easily.  Psychiatric/Behavioral:  Positive for behavioral problems (depression, stable) and sleep disturbance (sleeping better, takes ambien ). Negative for self-injury and suicidal ideas. The patient is nervous/anxious (stable).     Vital Signs: BP 118/70   Pulse 83   Temp 98.1 F (36.7 C)   Resp 16   Ht 5' 8.5" (1.74 m)   Wt 230 lb 6.4 oz (104.5 kg)   SpO2 98%   BMI 34.52 kg/m    Physical Exam Vitals reviewed.  Constitutional:      Appearance: Normal appearance. She is obese. She is not ill-appearing.  HENT:     Head: Normocephalic and atraumatic.  Eyes:      Pupils: Pupils are equal, round, and reactive to light.  Cardiovascular:     Rate and Rhythm: Normal rate and regular rhythm.  Pulmonary:     Effort: Pulmonary effort is normal. No respiratory distress.  Neurological:     Mental Status: She is alert and oriented to person, place, and time.  Psychiatric:        Mood and Affect: Mood normal.        Behavior: Behavior normal.        Assessment/Plan: 1. Type 2 diabetes mellitus with other specified complication, without long-term current use of insulin  (HCC) (Primary) Continue medications as prescribed, will look into patient assistance for trulicity  or switch to a different medication.   2. Hypertension associated with type 2 diabetes mellitus (HCC) Stable, continue atenolol , losartan  and hydrochlorothiazide  as prescribed.  - hydrochlorothiazide  (HYDRODIURIL ) 25 MG tablet; Take 1 tablet (25 mg total) by mouth daily.  Dispense: 90 tablet; Refill: 3 - atenolol  (TENORMIN ) 25 MG tablet; TAKE 1 TABLET BY MOUTH AT BEDTIME FOR BLOOD PRESSURE  Dispense: 90 tablet; Refill: 3  3. Hyperlipidemia associated with type 2 diabetes mellitus (HCC) Continue rosuvastatin  as prescribed.  - rosuvastatin  (CRESTOR ) 5 MG tablet; Take 1 tablet (5 mg total) by mouth daily.  Dispense: 90 tablet; Refill: 3  4. Stress incontinence (female) (female) Continue oxybutynin  as prescribed.  - oxybutynin  (DITROPAN -XL) 10 MG 24 hr tablet; TAKE 1 TABLET BY MOUTH EVERYDAY AT BEDTIME  Dispense: 90 tablet; Refill: 3  5. Gastro-esophageal reflux disease without esophagitis Continue pantoprazole  and metoclopramide  as prescribed.  - pantoprazole  (PROTONIX ) 40 MG tablet; Take 1 tablet (40 mg total) by mouth daily.  Dispense: 90 tablet; Refill: 3 - metoCLOPramide  (REGLAN ) 10 MG tablet; Take 1 tablet (10 mg total) by mouth 3 (three) times daily before meals.  Dispense: 90 tablet; Refill: 5  6. Migraine without aura and without status migrainosus, not intractable Continue prn  sumatriptan  for acute migranes.  - SUMAtriptan  (IMITREX ) 50 MG tablet; TAKE 1 TABLET EVERY 2 HOURS AS NEEDED FOR MIGRAINE. MAY REPEAT IN 2 HOURS IF HEADACHE PERSISTS OR RECURS. LIMIT TO 2  DOSES PER 24 HOURS  Dispense: 10 tablet; Refill: 3  7. Vasomotor symptoms due to menopause Continue HRT with estradiol  as prescribed. Continue venlafaxine  75 mg daily as prescribed.  - estradiol  (ESTRACE ) 0.5 MG tablet; Take 1 tablet (0.5 mg total) by mouth daily.  Dispense: 30 tablet; Refill: 5  8. Primary insomnia Continue ambien  as prescribed. Follow up in 3 months for additional refills.  - zolpidem  (AMBIEN ) 10 MG tablet; TAKE 1 TABLET BY MOUTH EVERYDAY AT BEDTIME  Dispense: 30 tablet; Refill: 2   General Counseling: Annalissa verbalizes understanding of the findings of todays visit and agrees with plan of treatment. I have discussed any further diagnostic evaluation that may be needed or ordered today. We also reviewed her medications today. she has been encouraged to call the office with any questions or concerns that should arise related to todays visit.    No orders of the defined types were placed in this encounter.   Meds ordered this encounter  Medications   zolpidem  (AMBIEN ) 10 MG tablet    Sig: TAKE 1 TABLET BY MOUTH EVERYDAY AT BEDTIME    Dispense:  30 tablet    Refill:  2    Not to exceed 3 additional fills before 11/14/2023   SUMAtriptan  (IMITREX ) 50 MG tablet    Sig: TAKE 1 TABLET EVERY 2 HOURS AS NEEDED FOR MIGRAINE. MAY REPEAT IN 2 HOURS IF HEADACHE PERSISTS OR RECURS. LIMIT TO 2 DOSES PER 24 HOURS    Dispense:  10 tablet    Refill:  3   rosuvastatin  (CRESTOR ) 5 MG tablet    Sig: Take 1 tablet (5 mg total) by mouth daily.    Dispense:  90 tablet    Refill:  3   pantoprazole  (PROTONIX ) 40 MG tablet    Sig: Take 1 tablet (40 mg total) by mouth daily.    Dispense:  90 tablet    Refill:  3   oxybutynin  (DITROPAN -XL) 10 MG 24 hr tablet    Sig: TAKE 1 TABLET BY MOUTH EVERYDAY AT  BEDTIME    Dispense:  90 tablet    Refill:  3   metoCLOPramide  (REGLAN ) 10 MG tablet    Sig: Take 1 tablet (10 mg total) by mouth 3 (three) times daily before meals.    Dispense:  90 tablet    Refill:  5   hydrochlorothiazide  (HYDRODIURIL ) 25 MG tablet    Sig: Take 1 tablet (25 mg total) by mouth daily.    Dispense:  90 tablet    Refill:  3   glimepiride  (AMARYL ) 1 MG tablet    Sig: Take 1 tablet by mouth daily with breakfast and dinner.    Dispense:  60 tablet    Refill:  5   estradiol  (ESTRACE ) 0.5 MG tablet    Sig: Take 1 tablet (0.5 mg total) by mouth daily.    Dispense:  30 tablet    Refill:  5    For future refills   atenolol  (TENORMIN ) 25 MG tablet    Sig: TAKE 1 TABLET BY MOUTH AT BEDTIME FOR BLOOD PRESSURE    Dispense:  90 tablet    Refill:  3    For future refills, please fill as 90 day supply    Return in about 3 months (around 03/15/2024) for F/U, med refill, Atiba Kimberlin PCP ambien .   Total time spent:30 Minutes Time spent includes review of chart, medications, test results, and follow up plan with the patient.   [place] Controlled Substance Database  was reviewed by me.  This patient was seen by Laurence Pons, FNP-C in collaboration with Dr. Verneta Gone as a part of collaborative care agreement.   Marianne Golightly R. Bobbi Burow, MSN, FNP-C Internal medicine

## 2023-12-25 ENCOUNTER — Ambulatory Visit
Admission: RE | Admit: 2023-12-25 | Discharge: 2023-12-25 | Disposition: A | Discharge status: Home / Self Care | Source: Ambulatory Visit | Attending: Nurse Practitioner | Admitting: Nurse Practitioner

## 2023-12-25 DIAGNOSIS — Z1231 Encounter for screening mammogram for malignant neoplasm of breast: Secondary | ICD-10-CM | POA: Diagnosis not present

## 2024-01-12 ENCOUNTER — Other Ambulatory Visit: Payer: Self-pay | Admitting: Nurse Practitioner

## 2024-01-12 DIAGNOSIS — F331 Major depressive disorder, recurrent, moderate: Secondary | ICD-10-CM

## 2024-01-12 DIAGNOSIS — F411 Generalized anxiety disorder: Secondary | ICD-10-CM

## 2024-01-12 DIAGNOSIS — Z79899 Other long term (current) drug therapy: Secondary | ICD-10-CM

## 2024-01-12 DIAGNOSIS — E1165 Type 2 diabetes mellitus with hyperglycemia: Secondary | ICD-10-CM

## 2024-01-13 ENCOUNTER — Other Ambulatory Visit: Payer: Self-pay | Admitting: Nurse Practitioner

## 2024-01-13 DIAGNOSIS — Z79899 Other long term (current) drug therapy: Secondary | ICD-10-CM

## 2024-02-01 DIAGNOSIS — M25562 Pain in left knee: Secondary | ICD-10-CM | POA: Diagnosis not present

## 2024-02-01 DIAGNOSIS — M1712 Unilateral primary osteoarthritis, left knee: Secondary | ICD-10-CM | POA: Diagnosis not present

## 2024-02-05 ENCOUNTER — Encounter: Payer: Self-pay | Admitting: Nurse Practitioner

## 2024-02-05 MED ORDER — VENLAFAXINE HCL ER 75 MG PO CP24
75.0000 mg | ORAL_CAPSULE | Freq: Every day | ORAL | 1 refills | Status: DC
Start: 2024-02-05 — End: 2024-06-06

## 2024-02-06 ENCOUNTER — Other Ambulatory Visit: Payer: Self-pay | Admitting: Nurse Practitioner

## 2024-02-06 DIAGNOSIS — E1142 Type 2 diabetes mellitus with diabetic polyneuropathy: Secondary | ICD-10-CM

## 2024-03-18 ENCOUNTER — Ambulatory Visit: Admitting: Nurse Practitioner

## 2024-03-18 ENCOUNTER — Encounter: Payer: Self-pay | Admitting: Nurse Practitioner

## 2024-03-18 VITALS — BP 120/74 | HR 74 | Temp 98.2°F | Resp 16 | Ht 68.5 in | Wt 231.8 lb

## 2024-03-18 DIAGNOSIS — E1169 Type 2 diabetes mellitus with other specified complication: Secondary | ICD-10-CM | POA: Diagnosis not present

## 2024-03-18 DIAGNOSIS — F5101 Primary insomnia: Secondary | ICD-10-CM | POA: Diagnosis not present

## 2024-03-18 DIAGNOSIS — I152 Hypertension secondary to endocrine disorders: Secondary | ICD-10-CM

## 2024-03-18 DIAGNOSIS — Z79899 Other long term (current) drug therapy: Secondary | ICD-10-CM | POA: Diagnosis not present

## 2024-03-18 DIAGNOSIS — E1159 Type 2 diabetes mellitus with other circulatory complications: Secondary | ICD-10-CM | POA: Diagnosis not present

## 2024-03-18 DIAGNOSIS — E1165 Type 2 diabetes mellitus with hyperglycemia: Secondary | ICD-10-CM

## 2024-03-18 DIAGNOSIS — N951 Menopausal and female climacteric states: Secondary | ICD-10-CM | POA: Diagnosis not present

## 2024-03-18 LAB — POCT GLYCOSYLATED HEMOGLOBIN (HGB A1C): Hemoglobin A1C: 6.3 % — AB (ref 4.0–5.6)

## 2024-03-18 MED ORDER — TIZANIDINE HCL 2 MG PO TABS: 2.0000 mg | ORAL_TABLET | Freq: Two times a day (BID) | ORAL | 2 refills | Status: AC | PRN

## 2024-03-18 MED ORDER — GABAPENTIN 300 MG PO CAPS: 300.0000 mg | ORAL_CAPSULE | Freq: Every day | ORAL | 5 refills | Status: DC

## 2024-03-18 MED ORDER — ZOLPIDEM TARTRATE 10 MG PO TABS
ORAL_TABLET | ORAL | 2 refills | Status: DC
Start: 2024-03-18 — End: 2024-04-15

## 2024-03-18 MED ORDER — ESTRADIOL 0.5 MG PO TABS: 0.5000 mg | ORAL_TABLET | Freq: Every day | ORAL | 5 refills | Status: DC

## 2024-03-18 MED ORDER — TRAMADOL HCL 50 MG PO TABS: 50.0000 mg | ORAL_TABLET | Freq: Two times a day (BID) | ORAL | 2 refills | Status: DC | PRN

## 2024-03-18 NOTE — Progress Notes (Signed)
 [facility] [address] [place], [place] [address]  Internal MEDICINE  Office Visit Note  Patient Name: Valerie Ramos  [id number]  [id number]  Date of Service: 03/18/2024  Chief Complaint  Patient presents with   Depression   Diabetes   Hypertension   Follow-up    HPI Valerie Ramos presents for a follow-up visit for diabetes, menopause, insomnia, and hypertension Diabetes -- A1c is stable and improved to 6.3 today from 6.6 previously.  Menopause symptoms -- taking estradiol  and venlafaxine  which helps control her symptoms adequately.  Insomnia -- taking ambien  as needed, due for refills.  Hypertension -- controlled with atenolol  and losartan      Current Medication: Outpatient Encounter Medications as of 03/18/2024  Medication Sig   acetaminophen  (TYLENOL ) 325 MG tablet Take 2 tablets (650 mg total) by mouth every 6 (six) hours as needed for mild pain (or Fever >/= 101).   atenolol  (TENORMIN ) 25 MG tablet TAKE 1 TABLET BY MOUTH AT BEDTIME FOR BLOOD PRESSURE   Dulaglutide  (TRULICITY ) 3 MG/0.5ML SOAJ INJECT 3 MG INTO THE SKIN ONE TIME PER WEEK   estradiol  (ESTRACE ) 0.5 MG tablet Take 1 tablet (0.5 mg total) by mouth daily.   fluticasone  (FLONASE ) 50 MCG/ACT nasal spray SPRAY 2 SPRAYS INTO EACH NOSTRIL EVERY DAY   gabapentin  (NEURONTIN ) 300 MG capsule Take 1 capsule (300 mg total) by mouth at bedtime.   glimepiride  (AMARYL ) 1 MG tablet TAKE 1 TABLET BY MOUTH DAILY WITH BREAKFAST AND DINNER.   glucose blood (ONETOUCH ULTRA BLUE TEST) test strip FOR ONCE DAILY TESTING   hydrochlorothiazide  (HYDRODIURIL ) 25 MG tablet Take 1 tablet (25 mg total) by mouth daily.   Lancets (ONETOUCH DELICA PLUS LANCET30G) MISC 1 EACH BY DOES NOT APPLY ROUTE DAILY. USE AS DIRECTED ONCE DAILY DIAG E11.65   losartan  (COZAAR ) 100 MG tablet Take 1 tablet (100 mg total) by mouth daily.   metoCLOPramide  (REGLAN ) 10 MG tablet Take 1 tablet (10 mg total) by mouth 3 (three) times daily before meals.    nitroGLYCERIN  (NITROSTAT ) 0.4 MG SL tablet Place 1 tablet (0.4 mg total) under the tongue every 5 (five) minutes as needed for chest pain. X 3 doses, then call 911 if chest pain continues with no relief.   oxybutynin  (DITROPAN -XL) 10 MG 24 hr tablet TAKE 1 TABLET BY MOUTH EVERYDAY AT BEDTIME   pantoprazole  (PROTONIX ) 40 MG tablet Take 1 tablet (40 mg total) by mouth daily.   PNEUMOCOCCAL 20-VAL CONJ VACC IM Inject into the muscle.   rosuvastatin  (CRESTOR ) 5 MG tablet Take 1 tablet (5 mg total) by mouth daily.   SUMAtriptan  (IMITREX ) 50 MG tablet TAKE 1 TABLET EVERY 2 HOURS AS NEEDED FOR MIGRAINE. MAY REPEAT IN 2 HOURS IF HEADACHE PERSISTS OR RECURS. LIMIT TO 2 DOSES PER 24 HOURS   tiZANidine  (ZANAFLEX ) 2 MG tablet Take 1 tablet (2 mg total) by mouth 2 (two) times daily as needed for muscle spasms.   traMADol  (ULTRAM ) 50 MG tablet Take 1 tablet (50 mg total) by mouth every 12 (twelve) hours as needed for moderate pain (pain score 4-6) or severe pain (pain score 7-10).   venlafaxine  XR (EFFEXOR -XR) 75 MG 24 hr capsule Take 1 capsule (75 mg total) by mouth daily with breakfast.   zolpidem  (AMBIEN ) 10 MG tablet TAKE 1 TABLET BY MOUTH EVERYDAY AT BEDTIME   [DISCONTINUED] estradiol  (ESTRACE ) 0.5 MG tablet Take 1 tablet (0.5 mg total) by mouth daily.   [DISCONTINUED] gabapentin  (NEURONTIN ) 300 MG capsule TAKE 1 CAPSULE  BY MOUTH EVERYDAY AT BEDTIME   [DISCONTINUED] tiZANidine  (ZANAFLEX ) 2 MG tablet TAKE 1 TABLET (2MG  TOTAL) BY MOUTH TWICE A DAY AS NEEDED FOR MUSCLE SPASM   [DISCONTINUED] traMADol  (ULTRAM ) 50 MG tablet Take 1 tablet (50 mg total) by mouth every 12 (twelve) hours as needed for moderate pain or severe pain.   [DISCONTINUED] zolpidem  (AMBIEN ) 10 MG tablet TAKE 1 TABLET BY MOUTH EVERYDAY AT BEDTIME   No facility-administered encounter medications on file as of 03/18/2024.    Surgical History: Past Surgical History:  Procedure Laterality Date   ABDOMINAL HYSTERECTOMY     CESAREAN SECTION   1989   COLONOSCOPY     COLONOSCOPY N/A 11/28/2021   Procedure: COLONOSCOPY;  Surgeon: Unk Corinn Skiff, MD;  Location: [facility] ENDOSCOPY;  Service: Gastroenterology;  Laterality: N/A;   CYSTECTOMY  2000   Ovarian   ESOPHAGOGASTRODUODENOSCOPY     OOPHORECTOMY     OVARIAN CYST SURGERY  2006   RECTOVAGINAL FISTULA CLOSURE  1980    Medical History: Past Medical History:  Diagnosis Date   Anxiety    Arthritis    Depression    Diabetes 1.5, managed as type 2 (HCC)    Hypertension    Insomnia    Migraines    Reflux    Sinusitis     Family History: Family History  Problem Relation Age of Onset   Hodgkin's lymphoma Mother    Diabetes Mother    Thyroid  disease Mother    Glaucoma Mother    Stroke Brother    Heart disease Brother     Social History   Socioeconomic History   Marital status: Married    Spouse name: Not on file   Number of children: Not on file   Years of education: Not on file   Highest education level: Not on file  Occupational History   Not on file  Tobacco Use   Smoking status: Former    Current packs/day: 0.00    Types: Cigarettes    Quit date: 05/24/2011    Years since quitting: 12.8   Smokeless tobacco: Never  Vaping Use   Vaping status: Not on file  Substance and Sexual Activity   Alcohol use: No   Drug use: No   Sexual activity: Not on file  Other Topics Concern   Not on file  Social History Narrative   Not on file   Social Drivers of Health   Financial Resource Strain: Not on file  Food Insecurity: Not on file  Transportation Needs: Not on file  Physical Activity: Not on file  Stress: Not on file  Social Connections: Not on file  Intimate Partner Violence: Not on file      Review of Systems  Constitutional:  Positive for fatigue. Negative for chills and unexpected weight change.  HENT:  Positive for postnasal drip. Negative for congestion, rhinorrhea, sneezing and sore throat.   Eyes:  Negative for redness.  Respiratory:  Negative.  Negative for cough, chest tightness, shortness of breath and wheezing.   Cardiovascular: Negative.  Negative for chest pain and palpitations.  Gastrointestinal: Negative.  Negative for abdominal pain, constipation, diarrhea, nausea and vomiting.  Genitourinary:  Negative for dysuria and frequency.  Musculoskeletal:  Negative for arthralgias, back pain, joint swelling and neck pain.  Skin:  Negative for rash.  Neurological: Negative.  Negative for tremors and numbness.  Hematological:  Negative for adenopathy. Does not bruise/bleed easily.  Psychiatric/Behavioral:  Positive for behavioral problems (depression, stable) and sleep disturbance (  sleeping better, takes ambien ). Negative for self-injury and suicidal ideas. The patient is nervous/anxious (stable).     Vital Signs: BP 120/74   Pulse 74   Temp 98.2 F (36.8 C)   Resp 16   Ht 5' 8.5 (1.74 m)   Wt 231 lb 12.8 oz (105.1 kg)   SpO2 96%   BMI 34.73 kg/m    Physical Exam Vitals reviewed.  Constitutional:      Appearance: Normal appearance. She is obese. She is not ill-appearing.  HENT:     Head: Normocephalic and atraumatic.  Eyes:     Pupils: Pupils are equal, round, and reactive to light.  Cardiovascular:     Rate and Rhythm: Normal rate and regular rhythm.  Pulmonary:     Effort: Pulmonary effort is normal. No respiratory distress.  Neurological:     Mental Status: She is alert and oriented to person, place, and time.  Psychiatric:        Mood and Affect: Mood normal.        Behavior: Behavior normal.        Assessment/Plan: 1. Type 2 diabetes mellitus with other specified complication, without long-term current use of insulin  (HCC) (Primary) A1c is improving, continue medications as prescribed.  - POCT glycosylated hemoglobin (Hb A1C)  2. Hypertension associated with type 2 diabetes mellitus (HCC) Stable, continue atenolol  and losartan  as prescribed.   3. Vasomotor symptoms due to  menopause Continue estradiol  and venlafaxine  as prescribed.  - estradiol  (ESTRACE ) 0.5 MG tablet; Take 1 tablet (0.5 mg total) by mouth daily.  Dispense: 30 tablet; Refill: 5  4. Primary insomnia Continue ambien  as prescribed, follow up in 3 months for additional refills.   5. Encounter for medication review Medication list reviewed, updated and refills ordered.  - traMADol  (ULTRAM ) 50 MG tablet; Take 1 tablet (50 mg total) by mouth every 12 (twelve) hours as needed for moderate pain (pain score 4-6) or severe pain (pain score 7-10).  Dispense: 60 tablet; Refill: 2 - tiZANidine  (ZANAFLEX ) 2 MG tablet; Take 1 tablet (2 mg total) by mouth 2 (two) times daily as needed for muscle spasms.  Dispense: 180 tablet; Refill: 2 - gabapentin  (NEURONTIN ) 300 MG capsule; Take 1 capsule (300 mg total) by mouth at bedtime.  Dispense: 30 capsule; Refill: 5   General Counseling: [place] understanding of the findings of todays visit and agrees with plan of treatment. I have discussed any further diagnostic evaluation that may be needed or ordered today. We also reviewed her medications today. she has been encouraged to call the office with any questions or concerns that should arise related to todays visit.    Orders Placed This Encounter  Procedures   POCT glycosylated hemoglobin (Hb A1C)    Meds ordered this encounter  Medications   zolpidem  (AMBIEN ) 10 MG tablet    Sig: TAKE 1 TABLET BY MOUTH EVERYDAY AT BEDTIME    Dispense:  30 tablet    Refill:  2    For future refills   traMADol  (ULTRAM ) 50 MG tablet    Sig: Take 1 tablet (50 mg total) by mouth every 12 (twelve) hours as needed for moderate pain (pain score 4-6) or severe pain (pain score 7-10).    Dispense:  60 tablet    Refill:  2    For future refills   tiZANidine  (ZANAFLEX ) 2 MG tablet    Sig: Take 1 tablet (2 mg total) by mouth 2 (two) times daily as needed for muscle  spasms.    Dispense:  180 tablet    Refill:  2    For  future refills   gabapentin  (NEURONTIN ) 300 MG capsule    Sig: Take 1 capsule (300 mg total) by mouth at bedtime.    Dispense:  30 capsule    Refill:  5    For future refills   estradiol  (ESTRACE ) 0.5 MG tablet    Sig: Take 1 tablet (0.5 mg total) by mouth daily.    Dispense:  30 tablet    Refill:  5    For future refills    Return in about 3 months (around 06/09/2024) for F/U, Dow Blahnik PCP, med refill ambien  and tramadol .   Total time spent:30 Minutes Time spent includes review of chart, medications, test results, and follow up plan with the patient.   [place] Controlled Substance Database was reviewed by me.  This patient was seen by Mardy Maxin, FNP-C in collaboration with Dr. Sigrid Bathe as a part of collaborative care agreement.   Letitia Sabala R. Maxin, MSN, FNP-C Internal medicine

## 2024-04-15 ENCOUNTER — Other Ambulatory Visit: Payer: Self-pay | Admitting: Nurse Practitioner

## 2024-04-15 ENCOUNTER — Telehealth: Payer: Self-pay

## 2024-04-15 ENCOUNTER — Other Ambulatory Visit: Payer: Self-pay

## 2024-04-15 DIAGNOSIS — Z79899 Other long term (current) drug therapy: Secondary | ICD-10-CM

## 2024-04-15 DIAGNOSIS — F5101 Primary insomnia: Secondary | ICD-10-CM

## 2024-04-15 MED ORDER — LOSARTAN POTASSIUM 100 MG PO TABS: 100.0000 mg | ORAL_TABLET | Freq: Every day | ORAL | 3 refills | Status: AC

## 2024-04-15 MED ORDER — ZOLPIDEM TARTRATE 10 MG PO TABS: ORAL_TABLET | ORAL | 2 refills | Status: DC

## 2024-04-18 NOTE — Telephone Encounter (Signed)
 done

## 2024-04-22 ENCOUNTER — Encounter: Payer: Self-pay | Admitting: Nurse Practitioner

## 2024-04-22 DIAGNOSIS — N951 Menopausal and female climacteric states: Secondary | ICD-10-CM | POA: Insufficient documentation

## 2024-05-09 DIAGNOSIS — M5412 Radiculopathy, cervical region: Secondary | ICD-10-CM | POA: Diagnosis not present

## 2024-05-09 DIAGNOSIS — M24812 Other specific joint derangements of left shoulder, not elsewhere classified: Secondary | ICD-10-CM | POA: Diagnosis not present

## 2024-05-09 DIAGNOSIS — E119 Type 2 diabetes mellitus without complications: Secondary | ICD-10-CM | POA: Diagnosis not present

## 2024-05-09 DIAGNOSIS — M7542 Impingement syndrome of left shoulder: Secondary | ICD-10-CM | POA: Diagnosis not present

## 2024-05-23 ENCOUNTER — Encounter: Payer: 59 | Admitting: Nurse Practitioner

## 2024-05-27 DIAGNOSIS — J0100 Acute maxillary sinusitis, unspecified: Secondary | ICD-10-CM | POA: Diagnosis not present

## 2024-06-05 ENCOUNTER — Other Ambulatory Visit: Payer: Self-pay | Admitting: Nurse Practitioner

## 2024-06-05 DIAGNOSIS — G43009 Migraine without aura, not intractable, without status migrainosus: Secondary | ICD-10-CM

## 2024-06-06 ENCOUNTER — Encounter: Payer: Self-pay | Admitting: Nurse Practitioner

## 2024-06-06 ENCOUNTER — Ambulatory Visit: Admitting: Nurse Practitioner

## 2024-06-06 VITALS — BP 128/74 | HR 70 | Temp 96.8°F | Resp 16 | Ht 68.5 in | Wt 235.0 lb

## 2024-06-06 DIAGNOSIS — F411 Generalized anxiety disorder: Secondary | ICD-10-CM

## 2024-06-06 DIAGNOSIS — E1165 Type 2 diabetes mellitus with hyperglycemia: Secondary | ICD-10-CM

## 2024-06-06 DIAGNOSIS — E1159 Type 2 diabetes mellitus with other circulatory complications: Secondary | ICD-10-CM | POA: Diagnosis not present

## 2024-06-06 DIAGNOSIS — E785 Hyperlipidemia, unspecified: Secondary | ICD-10-CM | POA: Diagnosis not present

## 2024-06-06 DIAGNOSIS — M150 Primary generalized (osteo)arthritis: Secondary | ICD-10-CM | POA: Diagnosis not present

## 2024-06-06 DIAGNOSIS — F5101 Primary insomnia: Secondary | ICD-10-CM

## 2024-06-06 DIAGNOSIS — I152 Hypertension secondary to endocrine disorders: Secondary | ICD-10-CM

## 2024-06-06 DIAGNOSIS — E1169 Type 2 diabetes mellitus with other specified complication: Secondary | ICD-10-CM

## 2024-06-06 MED ORDER — ALPRAZOLAM 0.25 MG PO TABS: 0.2500 mg | ORAL_TABLET | Freq: Every day | ORAL | 0 refills | Status: DC | PRN

## 2024-06-06 MED ORDER — ZOLPIDEM TARTRATE 10 MG PO TABS: ORAL_TABLET | ORAL | 2 refills | Status: DC

## 2024-06-06 MED ORDER — VENLAFAXINE HCL ER 150 MG PO CP24
150.0000 mg | ORAL_CAPSULE | Freq: Every day | ORAL | 1 refills | Status: AC
Start: 2024-06-06 — End: ?

## 2024-06-06 MED ORDER — TRAMADOL HCL 50 MG PO TABS: 50.0000 mg | ORAL_TABLET | Freq: Two times a day (BID) | ORAL | 2 refills | Status: DC | PRN

## 2024-06-06 MED ORDER — GLIMEPIRIDE 1 MG PO TABS: ORAL_TABLET | ORAL | 5 refills | Status: DC

## 2024-06-06 NOTE — Progress Notes (Signed)
 [facility] [address] [place], [place] [address]  Internal MEDICINE  Office Visit Note  Patient Name: Valerie Ramos  [id number]  [id number]  Date of Service: 06/06/2024  Chief Complaint  Patient presents with   Depression   Diabetes   Hypertension   Follow-up    HPI Valerie Ramos presents for a follow-up visit for anxiety, diabetes, hypertension, and high cholesterol.  Anxiety is worsening due to her son's and her husband's health statuses worsening. Her son has MS and is having more trouble walking and her husband has parkinson's disease. Her stress level is increased and she wants to be strong for them and holds in how she is feeling.  Diabetes --started on ozempic  a few months ago, still taking 0.25 mg weekly. Also takes glimepiride  twice daily with meals.  Hypertension -- controlled with atenolol , hydrochlorothiazide  and losartan   High cholesterol -- takes tosuvastatin daily.  Insomnia -- takes ambien  as needed.   Current Medication: Outpatient Encounter Medications as of 06/06/2024  Medication Sig   ALPRAZolam  (XANAX ) 0.25 MG tablet Take 1 tablet (0.25 mg total) by mouth daily as needed for anxiety.   venlafaxine  XR (EFFEXOR -XR) 150 MG 24 hr capsule Take 1 capsule (150 mg total) by mouth daily with breakfast.   acetaminophen  (TYLENOL ) 325 MG tablet Take 2 tablets (650 mg total) by mouth every 6 (six) hours as needed for mild pain (or Fever >/= 101).   atenolol  (TENORMIN ) 25 MG tablet TAKE 1 TABLET BY MOUTH AT BEDTIME FOR BLOOD PRESSURE   estradiol  (ESTRACE ) 0.5 MG tablet Take 1 tablet (0.5 mg total) by mouth daily.   fluticasone  (FLONASE ) 50 MCG/ACT nasal spray SPRAY 2 SPRAYS INTO EACH NOSTRIL EVERY DAY   gabapentin  (NEURONTIN ) 300 MG capsule Take 1 capsule (300 mg total) by mouth at bedtime.   glimepiride  (AMARYL ) 1 MG tablet Take 1 tablet by mouth daily with breakfast and dinner.   glucose blood (ONETOUCH ULTRA BLUE TEST) test strip FOR ONCE DAILY TESTING    hydrochlorothiazide  (HYDRODIURIL ) 25 MG tablet Take 1 tablet (25 mg total) by mouth daily.   Lancets (ONETOUCH DELICA PLUS LANCET30G) MISC 1 EACH BY DOES NOT APPLY ROUTE DAILY. USE AS DIRECTED ONCE DAILY DIAG E11.65   losartan  (COZAAR ) 100 MG tablet Take 1 tablet (100 mg total) by mouth daily.   metoCLOPramide  (REGLAN ) 10 MG tablet Take 1 tablet (10 mg total) by mouth 3 (three) times daily before meals.   nitroGLYCERIN  (NITROSTAT ) 0.4 MG SL tablet Place 1 tablet (0.4 mg total) under the tongue every 5 (five) minutes as needed for chest pain. X 3 doses, then call 911 if chest pain continues with no relief.   oxybutynin  (DITROPAN -XL) 10 MG 24 hr tablet TAKE 1 TABLET BY MOUTH EVERYDAY AT BEDTIME   pantoprazole  (PROTONIX ) 40 MG tablet Take 1 tablet (40 mg total) by mouth daily.   PNEUMOCOCCAL 20-VAL CONJ VACC IM Inject into the muscle.   rosuvastatin  (CRESTOR ) 5 MG tablet Take 1 tablet (5 mg total) by mouth daily.   SUMAtriptan  (IMITREX ) 50 MG tablet TAKE 1 TABLET EVERY 2 HOURS AS NEEDED FOR MIGRAINE. MAY REPEAT IN 2 HOURS IF HEADACHE PERSISTS OR RECURS. LIMIT TO 2 DOSES PER 24 HOURS   tiZANidine  (ZANAFLEX ) 2 MG tablet Take 1 tablet (2 mg total) by mouth 2 (two) times daily as needed for muscle spasms.   traMADol  (ULTRAM ) 50 MG tablet Take 1 tablet (50 mg total) by mouth every 12 (twelve) hours as needed for moderate pain (pain  score 4-6) or severe pain (pain score 7-10).   zolpidem  (AMBIEN ) 10 MG tablet TAKE 1 TABLET BY MOUTH EVERYDAY AT BEDTIME   [DISCONTINUED] Dulaglutide  (TRULICITY ) 3 MG/0.5ML SOAJ INJECT 3 MG INTO THE SKIN ONE TIME PER WEEK   [DISCONTINUED] glimepiride  (AMARYL ) 1 MG tablet TAKE 1 TABLET BY MOUTH DAILY WITH BREAKFAST AND DINNER.   [DISCONTINUED] traMADol  (ULTRAM ) 50 MG tablet Take 1 tablet (50 mg total) by mouth every 12 (twelve) hours as needed for moderate pain (pain score 4-6) or severe pain (pain score 7-10).   [DISCONTINUED] venlafaxine  XR (EFFEXOR -XR) 75 MG 24 hr capsule Take 1  capsule (75 mg total) by mouth daily with breakfast.   [DISCONTINUED] zolpidem  (AMBIEN ) 10 MG tablet TAKE 1 TABLET BY MOUTH EVERYDAY AT BEDTIME   No facility-administered encounter medications on file as of 06/06/2024.    Surgical History: Past Surgical History:  Procedure Laterality Date   ABDOMINAL HYSTERECTOMY     CESAREAN SECTION  1989   COLONOSCOPY     COLONOSCOPY N/A 11/28/2021   Procedure: COLONOSCOPY;  Surgeon: Unk Corinn Skiff, MD;  Location: [facility] ENDOSCOPY;  Service: Gastroenterology;  Laterality: N/A;   CYSTECTOMY  2000   Ovarian   ESOPHAGOGASTRODUODENOSCOPY     OOPHORECTOMY     OVARIAN CYST SURGERY  2006   RECTOVAGINAL FISTULA CLOSURE  1980    Medical History: Past Medical History:  Diagnosis Date   Anxiety    Arthritis    Depression    Diabetes 1.5, managed as type 2 (HCC)    Hypertension    Insomnia    Migraines    Reflux    Sinusitis     Family History: Family History  Problem Relation Age of Onset   Hodgkin's lymphoma Mother    Diabetes Mother    Thyroid  disease Mother    Glaucoma Mother    Stroke Brother    Heart disease Brother     Social History   Socioeconomic History   Marital status: Married    Spouse name: Not on file   Number of children: Not on file   Years of education: Not on file   Highest education level: Not on file  Occupational History   Not on file  Tobacco Use   Smoking status: Former    Current packs/day: 0.00    Types: Cigarettes    Quit date: 05/24/2011    Years since quitting: 13.0   Smokeless tobacco: Never  Vaping Use   Vaping status: Not on file  Substance and Sexual Activity   Alcohol use: No   Drug use: No   Sexual activity: Not on file  Other Topics Concern   Not on file  Social History Narrative   Not on file   Social Drivers of Health   Financial Resource Strain: Not on file  Food Insecurity: Not on file  Transportation Needs: Not on file  Physical Activity: Not on file  Stress: Not on file   Social Connections: Not on file  Intimate Partner Violence: Not on file      Review of Systems  Constitutional:  Positive for fatigue. Negative for chills and unexpected weight change.  HENT:  Positive for postnasal drip. Negative for congestion, rhinorrhea, sneezing and sore throat.   Eyes:  Negative for redness.  Respiratory: Negative.  Negative for cough, chest tightness, shortness of breath and wheezing.   Cardiovascular: Negative.  Negative for chest pain and palpitations.  Gastrointestinal: Negative.  Negative for abdominal pain, constipation, diarrhea, nausea and  vomiting.  Genitourinary:  Negative for dysuria and frequency.  Musculoskeletal:  Negative for arthralgias, back pain, joint swelling and neck pain.  Skin:  Negative for rash.  Neurological: Negative.  Negative for tremors and numbness.  Hematological:  Negative for adenopathy. Does not bruise/bleed easily.  Psychiatric/Behavioral:  Positive for behavioral problems (depression, stable) and sleep disturbance (sleeping better, takes ambien ). Negative for self-injury and suicidal ideas. The patient is nervous/anxious (stable).     Vital Signs: BP 128/74   Pulse 70   Temp (!) 96.8 F (36 C)   Resp 16   Ht 5' 8.5 (1.74 m)   Wt 235 lb (106.6 kg)   SpO2 99%   BMI 35.21 kg/m    Physical Exam Vitals reviewed.  Constitutional:      Appearance: Normal appearance. She is obese. She is not ill-appearing.  HENT:     Head: Normocephalic and atraumatic.  Eyes:     Pupils: Pupils are equal, round, and reactive to light.  Cardiovascular:     Rate and Rhythm: Normal rate and regular rhythm.  Pulmonary:     Effort: Pulmonary effort is normal. No respiratory distress.  Neurological:     Mental Status: She is alert and oriented to person, place, and time.  Psychiatric:        Mood and Affect: Mood normal.        Behavior: Behavior normal.        Assessment/Plan: 1. Type 2 diabetes mellitus with hyperglycemia,  without long-term current use of insulin  (HCC) (Primary) She is waiting for her first shipment of ozempic  from [facility], 1 more sample given to patient so she can continue the medication while waiting for the shipment. Continue other medications as prescribed.  - glimepiride  (AMARYL ) 1 MG tablet; Take 1 tablet by mouth daily with breakfast and dinner.  Dispense: 60 tablet; Refill: 5  2. Hypertension associated with type 2 diabetes mellitus (HCC) Stable, continue medications as prescribed.   3. Hyperlipidemia associated with type 2 diabetes mellitus (HCC) Continue rosuvastatin  as prescribed.   4. Primary generalized (osteo)arthritis Continue prn tramadol  as prescribed. Follow up in 3 months for additional refills.  - traMADol  (ULTRAM ) 50 MG tablet; Take 1 tablet (50 mg total) by mouth every 12 (twelve) hours as needed for moderate pain (pain score 4-6) or severe pain (pain score 7-10).  Dispense: 60 tablet; Refill: 2  5. Primary insomnia Continue ambien  as prescribed as needed. Follow up in 3 months for additional refills. - zolpidem  (AMBIEN ) 10 MG tablet; TAKE 1 TABLET BY MOUTH EVERYDAY AT BEDTIME  Dispense: 30 tablet; Refill: 2  6. Generalized anxiety disorder Venlafaxine  dose increased. Continue prn alprazolam  as prescribed.  - ALPRAZolam  (XANAX ) 0.25 MG tablet; Take 1 tablet (0.25 mg total) by mouth daily as needed for anxiety.  Dispense: 20 tablet; Refill: 0 - venlafaxine  XR (EFFEXOR -XR) 150 MG 24 hr capsule; Take 1 capsule (150 mg total) by mouth daily with breakfast.  Dispense: 90 capsule; Refill: 1   General Counseling: Avrie verbalizes understanding of the findings of todays visit and agrees with plan of treatment. I have discussed any further diagnostic evaluation that may be needed or ordered today. We also reviewed her medications today. she has been encouraged to call the office with any questions or concerns that should arise related to todays visit.    No orders of the  defined types were placed in this encounter.   Meds ordered this encounter  Medications   zolpidem  (AMBIEN ) 10 MG  tablet    Sig: TAKE 1 TABLET BY MOUTH EVERYDAY AT BEDTIME    Dispense:  30 tablet    Refill:  2    For future refills   traMADol  (ULTRAM ) 50 MG tablet    Sig: Take 1 tablet (50 mg total) by mouth every 12 (twelve) hours as needed for moderate pain (pain score 4-6) or severe pain (pain score 7-10).    Dispense:  60 tablet    Refill:  2    For future refills   glimepiride  (AMARYL ) 1 MG tablet    Sig: Take 1 tablet by mouth daily with breakfast and dinner.    Dispense:  60 tablet    Refill:  5   ALPRAZolam  (XANAX ) 0.25 MG tablet    Sig: Take 1 tablet (0.25 mg total) by mouth daily as needed for anxiety.    Dispense:  20 tablet    Refill:  0    Fill new script today.   venlafaxine  XR (EFFEXOR -XR) 150 MG 24 hr capsule    Sig: Take 1 capsule (150 mg total) by mouth daily with breakfast.    Dispense:  90 capsule    Refill:  1    Note increased dose, fill new script today, discontinue 75 mg dose.    Return for previously scheduled, AWV, Wylma Tatem PCP in december.   Total time spent:30 Minutes Time spent includes review of chart, medications, test results, and follow up plan with the patient.   [place] Controlled Substance Database was reviewed by me.  This patient was seen by Mardy Maxin, FNP-C in collaboration with Dr. Sigrid Bathe as a part of collaborative care agreement.   Wilmont Olund R. Maxin, MSN, FNP-C Internal medicine

## 2024-06-10 ENCOUNTER — Encounter: Payer: Self-pay | Admitting: Nurse Practitioner

## 2024-06-10 MED ORDER — SEMAGLUTIDE (1 MG/DOSE) 4 MG/3ML ~~LOC~~ SOPN: 1.0000 mg | PEN_INJECTOR | SUBCUTANEOUS | Status: DC

## 2024-06-29 ENCOUNTER — Other Ambulatory Visit: Payer: Self-pay | Admitting: Nurse Practitioner

## 2024-06-29 DIAGNOSIS — F331 Major depressive disorder, recurrent, moderate: Secondary | ICD-10-CM

## 2024-06-29 DIAGNOSIS — F411 Generalized anxiety disorder: Secondary | ICD-10-CM

## 2024-07-23 ENCOUNTER — Other Ambulatory Visit: Payer: Self-pay | Admitting: Nurse Practitioner

## 2024-07-23 DIAGNOSIS — E1165 Type 2 diabetes mellitus with hyperglycemia: Secondary | ICD-10-CM

## 2024-08-21 ENCOUNTER — Other Ambulatory Visit: Payer: Self-pay | Admitting: Nurse Practitioner

## 2024-08-21 DIAGNOSIS — Z79899 Other long term (current) drug therapy: Secondary | ICD-10-CM

## 2024-09-05 ENCOUNTER — Other Ambulatory Visit: Payer: Self-pay | Admitting: Nurse Practitioner

## 2024-09-05 DIAGNOSIS — K219 Gastro-esophageal reflux disease without esophagitis: Secondary | ICD-10-CM

## 2024-09-26 ENCOUNTER — Ambulatory Visit (INDEPENDENT_AMBULATORY_CARE_PROVIDER_SITE_OTHER): Payer: Self-pay | Admitting: Nurse Practitioner

## 2024-09-26 ENCOUNTER — Encounter: Payer: Self-pay | Admitting: Nurse Practitioner

## 2024-09-26 VITALS — BP 130/74 | HR 63 | Temp 96.5°F | Resp 16 | Ht 68.5 in | Wt 245.0 lb

## 2024-09-26 DIAGNOSIS — I152 Hypertension secondary to endocrine disorders: Secondary | ICD-10-CM

## 2024-09-26 DIAGNOSIS — F331 Major depressive disorder, recurrent, moderate: Secondary | ICD-10-CM | POA: Diagnosis not present

## 2024-09-26 DIAGNOSIS — F5101 Primary insomnia: Secondary | ICD-10-CM | POA: Diagnosis not present

## 2024-09-26 DIAGNOSIS — R300 Dysuria: Secondary | ICD-10-CM

## 2024-09-26 DIAGNOSIS — E1159 Type 2 diabetes mellitus with other circulatory complications: Secondary | ICD-10-CM

## 2024-09-26 DIAGNOSIS — M150 Primary generalized (osteo)arthritis: Secondary | ICD-10-CM

## 2024-09-26 DIAGNOSIS — E538 Deficiency of other specified B group vitamins: Secondary | ICD-10-CM

## 2024-09-26 DIAGNOSIS — E1169 Type 2 diabetes mellitus with other specified complication: Secondary | ICD-10-CM | POA: Diagnosis not present

## 2024-09-26 DIAGNOSIS — E559 Vitamin D deficiency, unspecified: Secondary | ICD-10-CM

## 2024-09-26 DIAGNOSIS — Z0001 Encounter for general adult medical examination with abnormal findings: Secondary | ICD-10-CM

## 2024-09-26 DIAGNOSIS — E1165 Type 2 diabetes mellitus with hyperglycemia: Secondary | ICD-10-CM | POA: Diagnosis not present

## 2024-09-26 DIAGNOSIS — F411 Generalized anxiety disorder: Secondary | ICD-10-CM

## 2024-09-26 DIAGNOSIS — E785 Hyperlipidemia, unspecified: Secondary | ICD-10-CM

## 2024-09-26 MED ORDER — ESTRADIOL 0.5 MG PO TABS: 0.5000 mg | ORAL_TABLET | Freq: Every day | ORAL | 5 refills | Status: AC

## 2024-09-26 MED ORDER — ZOLPIDEM TARTRATE 10 MG PO TABS: ORAL_TABLET | ORAL | 2 refills | Status: AC

## 2024-09-26 MED ORDER — TRAMADOL HCL 50 MG PO TABS: 50.0000 mg | ORAL_TABLET | Freq: Two times a day (BID) | ORAL | 2 refills | Status: AC | PRN

## 2024-09-26 MED ORDER — ALPRAZOLAM 0.25 MG PO TABS: 0.2500 mg | ORAL_TABLET | Freq: Every day | ORAL | 0 refills | Status: AC | PRN

## 2024-09-26 MED ORDER — DEXAMETHASONE 1 MG PO TABS: 1.0000 mg | ORAL_TABLET | Freq: Once | ORAL | 0 refills | Status: AC

## 2024-09-26 NOTE — Progress Notes (Signed)
 [facility] [address] [place], [place] [address]  Internal MEDICINE  Office Visit Note  Patient Name: Valerie Ramos  [id number]  [id number]  Date of Service: 09/26/2024  Chief Complaint  Patient presents with   Depression   Diabetes   Hypertension   Medicare Wellness    HPI Valerie Ramos presents for a medicare annual wellness visit.  Well-appearing 68 y.o. female with  diabetes, anxiety, depression, hypertension, arthritis, insomnia, high cholesterol, perimenopausal symptoms, and migraines.  Routine CRC screening: due in 2028 Routine mammogram: due in April 2026 DEXA scan: done in 2022, result was normal.  Pap smear:discontinued, aged out  Eye exam: diabetic eye exam done in January this year. foot exam: done  Labs: due for routine labs  New or worsening pain: left shoulder pain x2 weeks.  Other concerns: none      09/26/2024   11:29 AM 09/21/2023   10:48 AM  MMSE - Mini Mental State Exam  Orientation to time 5 5  Orientation to Place 5 5  Registration 3 3  Attention/ Calculation 5 5  Recall 3 3  Language- name 2 objects 2 2  Language- repeat 1 1  Language- follow 3 step command 3 3  Language- read & follow direction 1 1  Write a sentence 0 0  Copy design 1 1  Total score 29 29    Functional Status Survey: Is the patient deaf or have difficulty hearing?: No Does the patient have difficulty seeing, even when wearing glasses/contacts?: Yes Does the patient have difficulty concentrating, remembering, or making decisions?: No Does the patient have difficulty walking or climbing stairs?: No Does the patient have difficulty dressing or bathing?: No Does the patient have difficulty doing errands alone such as visiting a doctor's office or shopping?: No     06/12/2022    4:11 PM 10/22/2022   11:23 AM 05/18/2023    3:01 PM 09/21/2023   10:47 AM 09/26/2024   11:28 AM  Fall Risk  Falls in the past year? 0 0 0 0 0  Was there an injury with Fall? 0  0  0  0   0  Fall Risk Category Calculator 0 0 0 0 0  Fall Risk Category (Retired) Low       (RETIRED) Patient Fall Risk Level Low fall risk       Patient at Risk for Falls Due to No Fall Risks No Fall Risks No Fall Risks No Fall Risks   Fall risk Follow up Falls evaluation completed  Falls evaluation completed  Falls evaluation completed Falls evaluation completed Falls evaluation completed     Data saved with a previous flowsheet row definition       09/21/2023   12:55 PM  Depression screen PHQ 2/9  Decreased Interest 0  Down, Depressed, Hopeless 1  PHQ - 2 Score 1       Current Medication: Outpatient Encounter Medications as of 09/26/2024  Medication Sig   dexamethasone (DECADRON) 1 MG tablet Take 1 tablet (1 mg total) by mouth once for 1 dose. Between 11pm and midnight and then get lab drawn the next morning between 7-9 am.   acetaminophen  (TYLENOL ) 325 MG tablet Take 2 tablets (650 mg total) by mouth every 6 (six) hours as needed for mild pain (or Fever >/= 101).   ALPRAZolam  (XANAX ) 0.25 MG tablet Take 1 tablet (0.25 mg total) by mouth daily as needed for anxiety.   atenolol  (TENORMIN ) 25 MG tablet TAKE 1 TABLET BY  MOUTH AT BEDTIME FOR BLOOD PRESSURE   estradiol  (ESTRACE ) 0.5 MG tablet Take 1 tablet (0.5 mg total) by mouth daily.   fluticasone  (FLONASE ) 50 MCG/ACT nasal spray SPRAY 2 SPRAYS INTO EACH NOSTRIL EVERY DAY   gabapentin  (NEURONTIN ) 300 MG capsule Take 1 capsule (300 mg total) by mouth at bedtime.   glimepiride  (AMARYL ) 1 MG tablet TAKE 1 TABLET BY MOUTH DAILY WITH BREAKFAST AND DINNER.   glucose blood (ONETOUCH ULTRA BLUE TEST) test strip FOR ONCE DAILY TESTING   hydrochlorothiazide  (HYDRODIURIL ) 25 MG tablet Take 1 tablet (25 mg total) by mouth daily.   Lancets (ONETOUCH DELICA PLUS LANCET30G) MISC 1 EACH BY DOES NOT APPLY ROUTE DAILY. USE AS DIRECTED ONCE DAILY DIAG E11.65   losartan  (COZAAR ) 100 MG tablet Take 1 tablet (100 mg total) by mouth daily.   metoCLOPramide   (REGLAN ) 10 MG tablet TAKE 1 TABLET BY MOUTH 3 TIMES DAILY BEFORE MEALS.   nitroGLYCERIN  (NITROSTAT ) 0.4 MG SL tablet Place 1 tablet (0.4 mg total) under the tongue every 5 (five) minutes as needed for chest pain. X 3 doses, then call 911 if chest pain continues with no relief.   oxybutynin  (DITROPAN -XL) 10 MG 24 hr tablet TAKE 1 TABLET BY MOUTH EVERYDAY AT BEDTIME   pantoprazole  (PROTONIX ) 40 MG tablet Take 1 tablet (40 mg total) by mouth daily.   rosuvastatin  (CRESTOR ) 5 MG tablet Take 1 tablet (5 mg total) by mouth daily.   Semaglutide , 1 MG/DOSE, 4 MG/3ML SOPN Inject 1 mg as directed once a week.   SUMAtriptan  (IMITREX ) 50 MG tablet TAKE 1 TABLET EVERY 2 HOURS AS NEEDED FOR MIGRAINE. MAY REPEAT IN 2 HOURS IF HEADACHE PERSISTS OR RECURS. LIMIT TO 2 DOSES PER 24 HOURS   tiZANidine  (ZANAFLEX ) 2 MG tablet Take 1 tablet (2 mg total) by mouth 2 (two) times daily as needed for muscle spasms.   traMADol  (ULTRAM ) 50 MG tablet Take 1 tablet (50 mg total) by mouth every 12 (twelve) hours as needed for moderate pain (pain score 4-6) or severe pain (pain score 7-10).   venlafaxine  XR (EFFEXOR -XR) 150 MG 24 hr capsule Take 1 capsule (150 mg total) by mouth daily with breakfast.   zolpidem  (AMBIEN ) 10 MG tablet TAKE 1 TABLET BY MOUTH EVERYDAY AT BEDTIME   [DISCONTINUED] ALPRAZolam  (XANAX ) 0.25 MG tablet Take 1 tablet (0.25 mg total) by mouth daily as needed for anxiety.   [DISCONTINUED] estradiol  (ESTRACE ) 0.5 MG tablet Take 1 tablet (0.5 mg total) by mouth daily.   [DISCONTINUED] PNEUMOCOCCAL 20-VAL CONJ VACC IM Inject into the muscle.   [DISCONTINUED] traMADol  (ULTRAM ) 50 MG tablet Take 1 tablet (50 mg total) by mouth every 12 (twelve) hours as needed for moderate pain (pain score 4-6) or severe pain (pain score 7-10).   [DISCONTINUED] zolpidem  (AMBIEN ) 10 MG tablet TAKE 1 TABLET BY MOUTH EVERYDAY AT BEDTIME   No facility-administered encounter medications on file as of 09/26/2024.    Surgical  History: Past Surgical History:  Procedure Laterality Date   ABDOMINAL HYSTERECTOMY     CESAREAN SECTION  1989   COLONOSCOPY     COLONOSCOPY N/A 11/28/2021   Procedure: COLONOSCOPY;  Surgeon: Unk Corinn Skiff, MD;  Location: [facility] ENDOSCOPY;  Service: Gastroenterology;  Laterality: N/A;   CYSTECTOMY  2000   Ovarian   ESOPHAGOGASTRODUODENOSCOPY     OOPHORECTOMY     OVARIAN CYST SURGERY  2006   RECTOVAGINAL FISTULA CLOSURE  1980    Medical History: Past Medical History:  Diagnosis Date  Anxiety    Arthritis    Depression    Diabetes 1.5, managed as type 2 (HCC)    Hypertension    Insomnia    Migraines    Reflux    Sinusitis     Family History: Family History  Problem Relation Age of Onset   Hodgkin's lymphoma Mother    Diabetes Mother    Thyroid  disease Mother    Glaucoma Mother    Stroke Brother    Heart disease Brother     Social History   Socioeconomic History   Marital status: Married    Spouse name: Not on file   Number of children: Not on file   Years of education: Not on file   Highest education level: Not on file  Occupational History   Not on file  Tobacco Use   Smoking status: Former    Current packs/day: 0.00    Types: Cigarettes    Quit date: 05/24/2011    Years since quitting: 13.3   Smokeless tobacco: Never  Vaping Use   Vaping status: Not on file  Substance and Sexual Activity   Alcohol use: No   Drug use: No   Sexual activity: Not on file  Other Topics Concern   Not on file  Social History Narrative   Not on file   Social Drivers of Health   Tobacco Use: Medium Risk (09/26/2024)   Patient History    Smoking Tobacco Use: Former    Smokeless Tobacco Use: Never    Passive Exposure: Not on Actuary Strain: Not on file  Food Insecurity: Not on file  Transportation Needs: Not on file  Physical Activity: Not on file  Stress: Not on file  Social Connections: Not on file  Intimate Partner Violence: Not on file   Depression (PHQ2-9): Low Risk (09/21/2023)   Depression (PHQ2-9)    PHQ-2 Score: 1  Alcohol Screen: Low Risk (06/12/2022)   Alcohol Screen    Last Alcohol Screening Score (AUDIT): 0  Housing: Not on file  Utilities: Not on file  Health Literacy: Not on file      Review of Systems  Constitutional:  Negative for chills, fatigue and unexpected weight change.  HENT:  Negative for congestion, postnasal drip, rhinorrhea, sneezing and sore throat.   Eyes:  Negative for redness.  Respiratory:  Negative for cough, chest tightness and shortness of breath.   Cardiovascular:  Negative for chest pain and palpitations.  Gastrointestinal:  Negative for abdominal pain, constipation, diarrhea, nausea and vomiting.  Genitourinary:  Negative for dysuria and frequency.  Musculoskeletal:  Negative for arthralgias, back pain, joint swelling and neck pain.  Skin:  Negative for rash.  Neurological: Negative.  Negative for tremors and numbness.  Hematological:  Negative for adenopathy. Does not bruise/bleed easily.  Psychiatric/Behavioral:  Positive for behavioral problems (Depression) and sleep disturbance. Negative for decreased concentration, dysphoric mood, self-injury and suicidal ideas. The patient is nervous/anxious.     Vital Signs: BP 130/74   Pulse 63   Temp (!) 96.5 F (35.8 C)   Resp 16   Ht 5' 8.5 (1.74 m)   Wt 245 lb (111.1 kg)   SpO2 99%   BMI 36.71 kg/m    Physical Exam Vitals reviewed.  Constitutional:      General: She is not in acute distress.    Appearance: Normal appearance. She is obese. She is not ill-appearing.  HENT:     Head: Normocephalic and atraumatic.  Right Ear: Tympanic membrane, ear canal and external ear normal.     Left Ear: Tympanic membrane, ear canal and external ear normal.     Nose: Nose normal. No congestion or rhinorrhea.     Mouth/Throat:     Mouth: Mucous membranes are moist.     Pharynx: Oropharynx is clear. No oropharyngeal exudate or  posterior oropharyngeal erythema.  Eyes:     Extraocular Movements: Extraocular movements intact.     Conjunctiva/sclera: Conjunctivae normal.     Pupils: Pupils are equal, round, and reactive to light.  Cardiovascular:     Rate and Rhythm: Normal rate and regular rhythm.     Heart sounds: Normal heart sounds. No murmur heard. Pulmonary:     Effort: No respiratory distress.     Breath sounds: Normal breath sounds. No wheezing.  Abdominal:     General: Bowel sounds are normal. There is no distension.     Palpations: Abdomen is soft. There is no mass.     Tenderness: There is no abdominal tenderness. There is no guarding or rebound.     Hernia: No hernia is present.  Musculoskeletal:        General: Normal range of motion.     Cervical back: Normal range of motion and neck supple.     Right lower leg: No edema.     Left lower leg: No edema.  Lymphadenopathy:     Cervical: No cervical adenopathy.  Skin:    General: Skin is warm and dry.     Capillary Refill: Capillary refill takes less than 2 seconds.     Coloration: Skin is not jaundiced.     Findings: No bruising or lesion.  Neurological:     Mental Status: She is alert and oriented to person, place, and time.     Cranial Nerves: No cranial nerve deficit.     Coordination: Coordination normal.     Gait: Gait normal.  Psychiatric:        Mood and Affect: Mood normal.        Behavior: Behavior normal.        Thought Content: Thought content normal.        Judgment: Judgment normal.        Assessment/Plan: 1. Encounter for Medicare annual examination with abnormal findings (Primary) Age-appropriate preventive screenings and vaccinations discussed, annual physical exam completed. Routine labs for health maintenance ordered, see below. PHM updated.   - CBC with Differential/Platelet - CMP14+EGFR - Lipid Profile - Hgb A1C w/o eAG - Vitamin D  (25 hydroxy) - B12 and Folate Panel - Iron, TIBC and Ferritin Panel - Urine  Microalbumin w/creat. ratio - UA/M w/rflx Culture, Routine - Cortisol Dexamethasone Reflex - estradiol  (ESTRACE ) 0.5 MG tablet; Take 1 tablet (0.5 mg total) by mouth daily.  Dispense: 30 tablet; Refill: 5  2. Type 2 diabetes mellitus with hyperglycemia, without long-term current use of insulin  (HCC) Continue medications as prescribed, routine labs ordered  - CBC with Differential/Platelet - CMP14+EGFR - Lipid Profile - Hgb A1C w/o eAG - Urine Microalbumin w/creat. ratio - Cortisol Dexamethasone Reflex - dexamethasone (DECADRON) 1 MG tablet; Take 1 tablet (1 mg total) by mouth once for 1 dose. Between 11pm and midnight and then get lab drawn the next morning between 7-9 am.  Dispense: 1 tablet; Refill: 0  3. Hypertension associated with type 2 diabetes mellitus (HCC) Routine labs ordered  - CBC with Differential/Platelet - CMP14+EGFR - Lipid Profile  4. Hyperlipidemia associated with type 2  diabetes mellitus (HCC) Routine labs ordered  - CBC with Differential/Platelet - CMP14+EGFR - Lipid Profile  5. Primary generalized (osteo)arthritis Continue prn tramadol  as prescribed.  - traMADol  (ULTRAM ) 50 MG tablet; Take 1 tablet (50 mg total) by mouth every 12 (twelve) hours as needed for moderate pain (pain score 4-6) or severe pain (pain score 7-10).  Dispense: 60 tablet; Refill: 2  6. B12 deficiency Routine labs ordered  - CBC with Differential/Platelet - B12 and Folate Panel - Iron, TIBC and Ferritin Panel  7. Vitamin D  deficiency Routine lab ordered  - Vitamin D  (25 hydroxy)  8. Dysuria Urine sent to lab  - UA/M w/rflx Culture, Routine  9. Morbid (severe) obesity due to excess calories (HCC) Continue ozempic  as prescribed.   10. Primary insomnia Continue prn ambien  as prescribed, follow up in 3 months for additional refills. - zolpidem  (AMBIEN ) 10 MG tablet; TAKE 1 TABLET BY MOUTH EVERYDAY AT BEDTIME  Dispense: 30 tablet; Refill: 2  11. Generalized anxiety  disorder Continue prn alprazolam  as prescribed.  - ALPRAZolam  (XANAX ) 0.25 MG tablet; Take 1 tablet (0.25 mg total) by mouth daily as needed for anxiety.  Dispense: 20 tablet; Refill: 0  12. Moderate episode of recurrent major depressive disorder (HCC) Continue venlafaxine  as prescribed.      General Counseling: odette watanabe understanding of the findings of todays visit and agrees with plan of treatment. I have discussed any further diagnostic evaluation that may be needed or ordered today. We also reviewed her medications today. she has been encouraged to call the office with any questions or concerns that should arise related to todays visit.    Orders Placed This Encounter  Procedures   CBC with Differential/Platelet   CMP14+EGFR   Lipid Profile   Hgb A1C w/o eAG   Vitamin D  (25 hydroxy)   B12 and Folate Panel   Iron, TIBC and Ferritin Panel   Urine Microalbumin w/creat. ratio   UA/M w/rflx Culture, Routine   Cortisol Dexamethasone Reflex    Meds ordered this encounter  Medications   dexamethasone (DECADRON) 1 MG tablet    Sig: Take 1 tablet (1 mg total) by mouth once for 1 dose. Between 11pm and midnight and then get lab drawn the next morning between 7-9 am.    Dispense:  1 tablet    Refill:  0    Fill new script today.   ALPRAZolam  (XANAX ) 0.25 MG tablet    Sig: Take 1 tablet (0.25 mg total) by mouth daily as needed for anxiety.    Dispense:  20 tablet    Refill:  0    Fill new script today.   zolpidem  (AMBIEN ) 10 MG tablet    Sig: TAKE 1 TABLET BY MOUTH EVERYDAY AT BEDTIME    Dispense:  30 tablet    Refill:  2    For future refills   traMADol  (ULTRAM ) 50 MG tablet    Sig: Take 1 tablet (50 mg total) by mouth every 12 (twelve) hours as needed for moderate pain (pain score 4-6) or severe pain (pain score 7-10).    Dispense:  60 tablet    Refill:  2    For future refills   estradiol  (ESTRACE ) 0.5 MG tablet    Sig: Take 1 tablet (0.5 mg total) by mouth daily.     Dispense:  30 tablet    Refill:  5    For future refills    Return in about 4 weeks (around 10/24/2024) for F/U,  Labs, Mckensie Scotti PCP.   Total time spent:30 Minutes Time spent includes review of chart, medications, test results, and follow up plan with the patient.   [place] Controlled Substance Database was reviewed by me.  This patient was seen by Mardy Maxin, FNP-C in collaboration with Dr. Sigrid Bathe as a part of collaborative care agreement.  Kendy Haston R. Maxin, MSN, FNP-C Internal medicine

## 2024-09-28 LAB — UA/M W/RFLX CULTURE, ROUTINE
Bilirubin, UA: NEGATIVE
Nitrite, UA: NEGATIVE
Protein,UA: NEGATIVE
RBC, UA: NEGATIVE
Specific Gravity, UA: 1.019 (ref 1.005–1.030)
Urobilinogen, Ur: 1 mg/dL (ref 0.2–1.0)
pH, UA: 6 (ref 5.0–7.5)

## 2024-09-28 LAB — MICROSCOPIC EXAMINATION
Bacteria, UA: NONE SEEN
Casts: NONE SEEN /LPF
WBC, UA: NONE SEEN /HPF (ref 0–5)

## 2024-09-28 LAB — MICROALBUMIN / CREATININE URINE RATIO
Creatinine, Urine: 183.3 mg/dL
Microalb/Creat Ratio: 5 mg/g{creat} (ref 0–29)
Microalbumin, Urine: 10 ug/mL

## 2024-09-28 LAB — URINE CULTURE, REFLEX

## 2024-10-08 ENCOUNTER — Other Ambulatory Visit: Payer: Self-pay | Admitting: Nurse Practitioner

## 2024-10-08 DIAGNOSIS — Z79899 Other long term (current) drug therapy: Secondary | ICD-10-CM

## 2024-10-08 DIAGNOSIS — G43009 Migraine without aura, not intractable, without status migrainosus: Secondary | ICD-10-CM

## 2024-10-09 ENCOUNTER — Ambulatory Visit: Payer: Self-pay | Admitting: Nurse Practitioner

## 2024-10-09 NOTE — Progress Notes (Signed)
 The labs have been reviewed and will be discussed with the patient at her upcoming office visit.

## 2024-10-13 LAB — CMP14+EGFR
ALT: 17 IU/L (ref 0–32)
AST: 20 IU/L (ref 0–40)
Albumin: 4.2 g/dL (ref 3.9–4.9)
Alkaline Phosphatase: 100 IU/L (ref 49–135)
BUN/Creatinine Ratio: 14 (ref 12–28)
BUN: 12 mg/dL (ref 8–27)
Bilirubin Total: 0.3 mg/dL (ref 0.0–1.2)
CO2: 25 mmol/L (ref 20–29)
Calcium: 10.2 mg/dL (ref 8.7–10.3)
Chloride: 98 mmol/L (ref 96–106)
Creatinine, Ser: 0.86 mg/dL (ref 0.57–1.00)
Globulin, Total: 3.2 g/dL (ref 1.5–4.5)
Glucose: 189 mg/dL — ABNORMAL HIGH (ref 70–99)
Potassium: 4.4 mmol/L (ref 3.5–5.2)
Sodium: 140 mmol/L (ref 134–144)
Total Protein: 7.4 g/dL (ref 6.0–8.5)
eGFR: 74 mL/min/1.73

## 2024-10-13 LAB — CBC WITH DIFFERENTIAL/PLATELET
Basophils Absolute: 0.1 x10E3/uL (ref 0.0–0.2)
Basos: 1 %
EOS (ABSOLUTE): 0 x10E3/uL (ref 0.0–0.4)
Eos: 0 %
Hematocrit: 39.7 % (ref 34.0–46.6)
Hemoglobin: 12.6 g/dL (ref 11.1–15.9)
Immature Grans (Abs): 0 x10E3/uL (ref 0.0–0.1)
Immature Granulocytes: 0 %
Lymphocytes Absolute: 1.7 x10E3/uL (ref 0.7–3.1)
Lymphs: 17 %
MCH: 29.7 pg (ref 26.6–33.0)
MCHC: 31.7 g/dL (ref 31.5–35.7)
MCV: 94 fL (ref 79–97)
Monocytes Absolute: 0.3 x10E3/uL (ref 0.1–0.9)
Monocytes: 3 %
Neutrophils Absolute: 7.9 x10E3/uL — ABNORMAL HIGH (ref 1.4–7.0)
Neutrophils: 79 %
Platelets: 359 x10E3/uL (ref 150–450)
RBC: 4.24 x10E6/uL (ref 3.77–5.28)
RDW: 12 % (ref 11.7–15.4)
WBC: 10 x10E3/uL (ref 3.4–10.8)

## 2024-10-13 LAB — CORTISOL DEXAMETHASONE REFLEX: Cortisol, Serum LCMS: 1.3 ug/dL

## 2024-10-13 LAB — B12 AND FOLATE PANEL
Folate: 3.5 ng/mL
Vitamin B-12: 526 pg/mL (ref 232–1245)

## 2024-10-13 LAB — LIPID PANEL
Chol/HDL Ratio: 2.5 ratio (ref 0.0–4.4)
Cholesterol, Total: 153 mg/dL (ref 100–199)
HDL: 61 mg/dL
LDL Chol Calc (NIH): 75 mg/dL (ref 0–99)
Triglycerides: 93 mg/dL (ref 0–149)
VLDL Cholesterol Cal: 17 mg/dL (ref 5–40)

## 2024-10-13 LAB — HGB A1C W/O EAG: Hgb A1c MFr Bld: 7.8 % — ABNORMAL HIGH (ref 4.8–5.6)

## 2024-10-13 LAB — IRON,TIBC AND FERRITIN PANEL
Ferritin: 34 ng/mL (ref 15–150)
Iron Saturation: 16 % (ref 15–55)
Iron: 64 ug/dL (ref 27–139)
Total Iron Binding Capacity: 391 ug/dL (ref 250–450)
UIBC: 327 ug/dL (ref 118–369)

## 2024-10-13 LAB — VITAMIN D 25 HYDROXY (VIT D DEFICIENCY, FRACTURES): Vit D, 25-Hydroxy: 30.5 ng/mL (ref 30.0–100.0)

## 2024-10-24 ENCOUNTER — Ambulatory Visit: Admitting: Nurse Practitioner

## 2024-10-27 ENCOUNTER — Encounter: Payer: Self-pay | Admitting: Nurse Practitioner

## 2024-10-27 ENCOUNTER — Ambulatory Visit: Admitting: Nurse Practitioner

## 2024-10-27 VITALS — BP 128/70 | HR 80 | Temp 95.8°F | Resp 16 | Ht 68.5 in | Wt 245.6 lb

## 2024-10-27 DIAGNOSIS — E1159 Type 2 diabetes mellitus with other circulatory complications: Secondary | ICD-10-CM

## 2024-10-27 DIAGNOSIS — I152 Hypertension secondary to endocrine disorders: Secondary | ICD-10-CM | POA: Diagnosis not present

## 2024-10-27 DIAGNOSIS — E1169 Type 2 diabetes mellitus with other specified complication: Secondary | ICD-10-CM | POA: Diagnosis not present

## 2024-10-27 DIAGNOSIS — E785 Hyperlipidemia, unspecified: Secondary | ICD-10-CM | POA: Diagnosis not present

## 2024-10-27 MED ORDER — SEMAGLUTIDE (1 MG/DOSE) 4 MG/3ML ~~LOC~~ SOPN: 1.0000 mg | PEN_INJECTOR | SUBCUTANEOUS | 5 refills | Status: AC

## 2024-10-27 NOTE — Progress Notes (Signed)
 [facility] [address] [place], [place] [address]  Internal MEDICINE  Office Visit Note  Patient Name: Valerie Ramos  [id number]  [id number]  Date of Service: 10/27/2024  Chief Complaint  Patient presents with   Depression   Diabetes   Hypertension   Follow-up    Review labs    HPI Valerie Ramos presents for a follow-up visit for lab results.  Cortisol dexamethasone  suppression test was normal.  Diabetes -- A1c was elevated at 7.8. not sure if insurance will cover ozempic  well and be affordable. Elevated fasting glucose of 189.  Cholesterol panel was normal, taking rosuvastatin  daily.  The rest of the labs are grossly normal.  Hypertension -- controlled on losartan , atenolol  and hydrochlorothiazide      Current Medication: Outpatient Encounter Medications as of 10/27/2024  Medication Sig   acetaminophen  (TYLENOL ) 325 MG tablet Take 2 tablets (650 mg total) by mouth every 6 (six) hours as needed for mild pain (or Fever >/= 101).   ALPRAZolam  (XANAX ) 0.25 MG tablet Take 1 tablet (0.25 mg total) by mouth daily as needed for anxiety.   atenolol  (TENORMIN ) 25 MG tablet TAKE 1 TABLET BY MOUTH AT BEDTIME FOR BLOOD PRESSURE   estradiol  (ESTRACE ) 0.5 MG tablet Take 1 tablet (0.5 mg total) by mouth daily.   fluticasone  (FLONASE ) 50 MCG/ACT nasal spray SPRAY 2 SPRAYS INTO EACH NOSTRIL EVERY DAY   gabapentin  (NEURONTIN ) 300 MG capsule TAKE 1 CAPSULE BY MOUTH EVERYDAY AT BEDTIME   glimepiride  (AMARYL ) 1 MG tablet TAKE 1 TABLET BY MOUTH DAILY WITH BREAKFAST AND DINNER.   glucose blood (ONETOUCH ULTRA BLUE TEST) test strip FOR ONCE DAILY TESTING   hydrochlorothiazide  (HYDRODIURIL ) 25 MG tablet Take 1 tablet (25 mg total) by mouth daily.   Lancets (ONETOUCH DELICA PLUS LANCET30G) MISC 1 EACH BY DOES NOT APPLY ROUTE DAILY. USE AS DIRECTED ONCE DAILY DIAG E11.65   losartan  (COZAAR ) 100 MG tablet Take 1 tablet (100 mg total) by mouth daily.   metoCLOPramide  (REGLAN ) 10 MG tablet TAKE 1  TABLET BY MOUTH 3 TIMES DAILY BEFORE MEALS.   nitroGLYCERIN  (NITROSTAT ) 0.4 MG SL tablet Place 1 tablet (0.4 mg total) under the tongue every 5 (five) minutes as needed for chest pain. X 3 doses, then call 911 if chest pain continues with no relief.   oxybutynin  (DITROPAN -XL) 10 MG 24 hr tablet TAKE 1 TABLET BY MOUTH EVERYDAY AT BEDTIME   pantoprazole  (PROTONIX ) 40 MG tablet Take 1 tablet (40 mg total) by mouth daily.   rosuvastatin  (CRESTOR ) 5 MG tablet Take 1 tablet (5 mg total) by mouth daily.   Semaglutide , 1 MG/DOSE, 4 MG/3ML SOPN Inject 1 mg as directed once a week.   SUMAtriptan  (IMITREX ) 50 MG tablet TAKE 1 TABLET EVERY 2 HOURS AS NEEDED FOR MIGRAINE. MAY REPEAT IN 2 HOURS IF HEADACHE PERSISTS OR RECURS. LIMIT TO 2 DOSES PER 24 HOURS   tiZANidine  (ZANAFLEX ) 2 MG tablet Take 1 tablet (2 mg total) by mouth 2 (two) times daily as needed for muscle spasms.   traMADol  (ULTRAM ) 50 MG tablet Take 1 tablet (50 mg total) by mouth every 12 (twelve) hours as needed for moderate pain (pain score 4-6) or severe pain (pain score 7-10).   venlafaxine  XR (EFFEXOR -XR) 150 MG 24 hr capsule Take 1 capsule (150 mg total) by mouth daily with breakfast.   zolpidem  (AMBIEN ) 10 MG tablet TAKE 1 TABLET BY MOUTH EVERYDAY AT BEDTIME   [DISCONTINUED] Semaglutide , 1 MG/DOSE, 4 MG/3ML SOPN Inject 1 mg  as directed once a week.   No facility-administered encounter medications on file as of 10/27/2024.    Surgical History: Past Surgical History:  Procedure Laterality Date   ABDOMINAL HYSTERECTOMY     CESAREAN SECTION  1989   COLONOSCOPY     COLONOSCOPY N/A 11/28/2021   Procedure: COLONOSCOPY;  Surgeon: Unk Corinn Skiff, MD;  Location: [facility] ENDOSCOPY;  Service: Gastroenterology;  Laterality: N/A;   CYSTECTOMY  2000   Ovarian   ESOPHAGOGASTRODUODENOSCOPY     OOPHORECTOMY     OVARIAN CYST SURGERY  2006   RECTOVAGINAL FISTULA CLOSURE  1980    Medical History: Past Medical History:  Diagnosis Date   Anxiety     Arthritis    Depression    Diabetes 1.5, managed as type 2 (HCC)    Hypertension    Insomnia    Migraines    Reflux    Sinusitis     Family History: Family History  Problem Relation Age of Onset   Hodgkin's lymphoma Mother    Diabetes Mother    Thyroid  disease Mother    Glaucoma Mother    Stroke Brother    Heart disease Brother     Social History   Socioeconomic History   Marital status: Married    Spouse name: Not on file   Number of children: Not on file   Years of education: Not on file   Highest education level: Not on file  Occupational History   Not on file  Tobacco Use   Smoking status: Former    Current packs/day: 0.00    Types: Cigarettes    Quit date: 05/24/2011    Years since quitting: 13.4   Smokeless tobacco: Never  Vaping Use   Vaping status: Not on file  Substance and Sexual Activity   Alcohol use: No   Drug use: No   Sexual activity: Not on file  Other Topics Concern   Not on file  Social History Narrative   Not on file   Social Drivers of Health   Tobacco Use: Medium Risk (10/27/2024)   Patient History    Smoking Tobacco Use: Former    Smokeless Tobacco Use: Never    Passive Exposure: Not on Actuary Strain: Not on file  Food Insecurity: Not on file  Transportation Needs: Not on file  Physical Activity: Not on file  Stress: Not on file  Social Connections: Not on file  Intimate Partner Violence: Not on file  Depression (PHQ2-9): Low Risk (09/21/2023)   Depression (PHQ2-9)    PHQ-2 Score: 1  Alcohol Screen: Low Risk (06/12/2022)   Alcohol Screen    Last Alcohol Screening Score (AUDIT): 0  Housing: Not on file  Utilities: Not on file  Health Literacy: Not on file      Review of Systems  Constitutional:  Positive for fatigue. Negative for chills and unexpected weight change.  HENT:  Positive for postnasal drip. Negative for congestion, rhinorrhea, sneezing and sore throat.   Eyes:  Negative for redness.   Respiratory: Negative.  Negative for cough, chest tightness, shortness of breath and wheezing.   Cardiovascular: Negative.  Negative for chest pain and palpitations.  Gastrointestinal: Negative.  Negative for abdominal pain, constipation, diarrhea, nausea and vomiting.  Genitourinary:  Negative for dysuria and frequency.  Musculoskeletal:  Negative for arthralgias, back pain, joint swelling and neck pain.  Skin:  Negative for rash.  Neurological: Negative.  Negative for tremors and numbness.  Hematological:  Negative for adenopathy.  Does not bruise/bleed easily.  Psychiatric/Behavioral:  Positive for behavioral problems (depression, stable) and sleep disturbance (sleeping better, takes ambien ). Negative for self-injury and suicidal ideas. The patient is nervous/anxious (stable).     Vital Signs: BP 128/70   Pulse 80   Temp (!) 95.8 F (35.4 C)   Resp 16   Ht 5' 8.5 (1.74 m)   Wt 245 lb 9.6 oz (111.4 kg)   SpO2 98%   BMI 36.80 kg/m    Physical Exam Vitals reviewed.  Constitutional:      Appearance: Normal appearance. She is obese. She is not ill-appearing.  HENT:     Head: Normocephalic and atraumatic.  Eyes:     Pupils: Pupils are equal, round, and reactive to light.  Cardiovascular:     Rate and Rhythm: Normal rate and regular rhythm.  Pulmonary:     Effort: Pulmonary effort is normal. No respiratory distress.  Neurological:     Mental Status: She is alert and oriented to person, place, and time.  Psychiatric:        Mood and Affect: Mood normal.        Behavior: Behavior normal.        Assessment/Plan: 1. Type 2 diabetes mellitus with other specified complication, without long-term current use of insulin  (HCC) (Primary) Continue ozempic  as prescribed. Follow up in 1 month. If ozempic  is not affordable on her insurance will switch to farxiga and apply for PAP program with [employer].  - Semaglutide , 1 MG/DOSE, 4 MG/3ML SOPN; Inject 1 mg as directed once a week.   Dispense: 3 mL; Refill: 5  2. Hypertension associated with type 2 diabetes mellitus (HCC) Stable ,continue atenolol , losartan  and hydrochlorothiazide  as prescribed.   3. Hyperlipidemia associated with type 2 diabetes mellitus (HCC) Continue rosuvastatin  as prescribed.    General Counseling: natally ribera understanding of the findings of todays visit and agrees with plan of treatment. I have discussed any further diagnostic evaluation that may be needed or ordered today. We also reviewed her medications today. she has been encouraged to call the office with any questions or concerns that should arise related to todays visit.    No orders of the defined types were placed in this encounter.   Meds ordered this encounter  Medications   Semaglutide , 1 MG/DOSE, 4 MG/3ML SOPN    Sig: Inject 1 mg as directed once a week.    Dispense:  3 mL    Refill:  5    Dx code E11.69, Please send prior authorization request to fax # [phone].    Return in about 6 weeks (around 12/07/2024) for F/U, Khloei Spiker PCP, eval new med.   Total time spent:30 Minutes Time spent includes review of chart, medications, test results, and follow up plan with the patient.   [place] Controlled Substance Database was reviewed by me.  This patient was seen by Mardy Maxin, FNP-C in collaboration with Dr. Sigrid Bathe as a part of collaborative care agreement.   Tresa Jolley R. Maxin, MSN, FNP-C Internal medicine

## 2024-11-02 ENCOUNTER — Other Ambulatory Visit: Payer: Self-pay | Admitting: Nurse Practitioner

## 2024-11-02 ENCOUNTER — Telehealth: Payer: Self-pay

## 2024-11-02 NOTE — Telephone Encounter (Signed)
 Patient was approved for [employer] for Faxiga.

## 2024-12-07 ENCOUNTER — Ambulatory Visit: Admitting: Nurse Practitioner

## 2025-09-27 ENCOUNTER — Ambulatory Visit: Admitting: Nurse Practitioner
